# Patient Record
Sex: Female | Born: 1935 | Race: White | Hispanic: No | Smoking: Never smoker
Health system: Southern US, Community
[De-identification: ages and names within clinical notes are randomized; demographics above are authoritative.]

## PROBLEM LIST (undated history)

## (undated) DIAGNOSIS — K8689 Other specified diseases of pancreas: Secondary | ICD-10-CM

## (undated) DIAGNOSIS — H409 Unspecified glaucoma: Secondary | ICD-10-CM

## (undated) DIAGNOSIS — G5600 Carpal tunnel syndrome, unspecified upper limb: Secondary | ICD-10-CM

## (undated) DIAGNOSIS — J3089 Other allergic rhinitis: Secondary | ICD-10-CM

## (undated) DIAGNOSIS — Z8659 Personal history of other mental and behavioral disorders: Secondary | ICD-10-CM

## (undated) DIAGNOSIS — S83206A Unspecified tear of unspecified meniscus, current injury, right knee, initial encounter: Secondary | ICD-10-CM

## (undated) DIAGNOSIS — K635 Polyp of colon: Secondary | ICD-10-CM

## (undated) DIAGNOSIS — I679 Cerebrovascular disease, unspecified: Secondary | ICD-10-CM

## (undated) DIAGNOSIS — M47812 Spondylosis without myelopathy or radiculopathy, cervical region: Secondary | ICD-10-CM

## (undated) DIAGNOSIS — I700 Atherosclerosis of aorta: Secondary | ICD-10-CM

## (undated) DIAGNOSIS — M47816 Spondylosis without myelopathy or radiculopathy, lumbar region: Secondary | ICD-10-CM

## (undated) DIAGNOSIS — I639 Cerebral infarction, unspecified: Secondary | ICD-10-CM

## (undated) DIAGNOSIS — E785 Hyperlipidemia, unspecified: Secondary | ICD-10-CM

## (undated) DIAGNOSIS — M1990 Unspecified osteoarthritis, unspecified site: Secondary | ICD-10-CM

## (undated) DIAGNOSIS — K219 Gastro-esophageal reflux disease without esophagitis: Secondary | ICD-10-CM

## (undated) DIAGNOSIS — E039 Hypothyroidism, unspecified: Secondary | ICD-10-CM

## (undated) DIAGNOSIS — I480 Paroxysmal atrial fibrillation: Secondary | ICD-10-CM

## (undated) DIAGNOSIS — H3530 Unspecified macular degeneration: Secondary | ICD-10-CM

## (undated) DIAGNOSIS — I2510 Atherosclerotic heart disease of native coronary artery without angina pectoris: Secondary | ICD-10-CM

## (undated) DIAGNOSIS — K589 Irritable bowel syndrome without diarrhea: Secondary | ICD-10-CM

## (undated) DIAGNOSIS — I10 Essential (primary) hypertension: Secondary | ICD-10-CM

## (undated) DIAGNOSIS — J45909 Unspecified asthma, uncomplicated: Secondary | ICD-10-CM

## (undated) DIAGNOSIS — G459 Transient cerebral ischemic attack, unspecified: Secondary | ICD-10-CM

## (undated) DIAGNOSIS — M810 Age-related osteoporosis without current pathological fracture: Secondary | ICD-10-CM

## (undated) DIAGNOSIS — T8859XA Other complications of anesthesia, initial encounter: Secondary | ICD-10-CM

## (undated) DIAGNOSIS — I482 Chronic atrial fibrillation, unspecified: Secondary | ICD-10-CM

## (undated) DIAGNOSIS — I499 Cardiac arrhythmia, unspecified: Secondary | ICD-10-CM

## (undated) DIAGNOSIS — E063 Autoimmune thyroiditis: Secondary | ICD-10-CM

## (undated) DIAGNOSIS — K449 Diaphragmatic hernia without obstruction or gangrene: Secondary | ICD-10-CM

## (undated) HISTORY — PX: TONSILLECTOMY: SUR1361

## (undated) HISTORY — PX: OTHER SURGICAL HISTORY: SHX169

## (undated) HISTORY — DX: Other allergic rhinitis: J30.89

## (undated) HISTORY — PX: CERVICAL CONE BIOPSY: SUR198

## (undated) HISTORY — PX: BACK SURGERY: SHX140

## (undated) HISTORY — DX: Carpal tunnel syndrome, unspecified upper limb: G56.00

## (undated) HISTORY — PX: ENDOSCOPIC RETROGRADE CHOLANGIOPANCREATOGRAPHY (ERCP) WITH PROPOFOL: SHX5810

## (undated) HISTORY — DX: Autoimmune thyroiditis: E06.3

## (undated) HISTORY — DX: Unspecified tear of unspecified meniscus, current injury, right knee, initial encounter: S83.206A

## (undated) HISTORY — DX: Unspecified asthma, uncomplicated: J45.909

## (undated) HISTORY — DX: Paroxysmal atrial fibrillation: I48.0

## (undated) HISTORY — PX: CERVICAL FUSION: SHX112

## (undated) HISTORY — DX: Spondylosis without myelopathy or radiculopathy, cervical region: M47.812

## (undated) HISTORY — DX: Personal history of other mental and behavioral disorders: Z86.59

## (undated) HISTORY — DX: Transient cerebral ischemic attack, unspecified: G45.9

## (undated) HISTORY — PX: CATARACT EXTRACTION: SUR2

## (undated) HISTORY — DX: Gastro-esophageal reflux disease without esophagitis: K21.9

## (undated) HISTORY — DX: Unspecified glaucoma: H40.9

## (undated) HISTORY — DX: Age-related osteoporosis without current pathological fracture: M81.0

## (undated) HISTORY — PX: COLONOSCOPY: SHX174

## (undated) HISTORY — DX: Hyperlipidemia, unspecified: E78.5

## (undated) HISTORY — DX: Irritable bowel syndrome, unspecified: K58.9

## (undated) HISTORY — DX: Spondylosis without myelopathy or radiculopathy, cervical region: M47.816

## (undated) HISTORY — DX: Unspecified osteoarthritis, unspecified site: M19.90

## (undated) HISTORY — DX: Polyp of colon: K63.5

## (undated) HISTORY — PX: KNEE ARTHROSCOPY: SUR90

## (undated) HISTORY — DX: Diaphragmatic hernia without obstruction or gangrene: K44.9

---

## 2003-04-17 ENCOUNTER — Inpatient Hospital Stay (HOSPITAL_COMMUNITY): Admission: RE | Admit: 2003-04-17 | Discharge: 2003-04-18 | Payer: Self-pay | Admitting: Neurosurgery

## 2003-05-07 ENCOUNTER — Encounter: Admission: RE | Admit: 2003-05-07 | Discharge: 2003-05-07 | Payer: Self-pay | Admitting: Neurosurgery

## 2004-02-28 ENCOUNTER — Encounter: Payer: Self-pay | Admitting: Neurosurgery

## 2004-05-03 ENCOUNTER — Ambulatory Visit: Payer: Self-pay | Admitting: Unknown Physician Specialty

## 2004-06-15 ENCOUNTER — Inpatient Hospital Stay (HOSPITAL_COMMUNITY): Admission: RE | Admit: 2004-06-15 | Discharge: 2004-06-15 | Payer: Self-pay | Admitting: Neurosurgery

## 2004-09-28 ENCOUNTER — Ambulatory Visit: Payer: Self-pay | Admitting: Unknown Physician Specialty

## 2005-04-07 ENCOUNTER — Ambulatory Visit: Payer: Self-pay | Admitting: Unknown Physician Specialty

## 2005-11-08 ENCOUNTER — Ambulatory Visit: Payer: Self-pay | Admitting: Internal Medicine

## 2005-12-23 ENCOUNTER — Ambulatory Visit: Payer: Self-pay | Admitting: Unknown Physician Specialty

## 2006-01-23 ENCOUNTER — Ambulatory Visit: Payer: Self-pay | Admitting: Unknown Physician Specialty

## 2006-02-13 ENCOUNTER — Ambulatory Visit: Payer: Self-pay | Admitting: Unknown Physician Specialty

## 2010-10-04 ENCOUNTER — Ambulatory Visit: Payer: Self-pay | Admitting: Unknown Physician Specialty

## 2011-05-10 ENCOUNTER — Ambulatory Visit: Payer: Self-pay | Admitting: Unknown Physician Specialty

## 2012-04-04 ENCOUNTER — Ambulatory Visit: Payer: Self-pay | Admitting: Ophthalmology

## 2012-08-16 ENCOUNTER — Emergency Department: Payer: Self-pay | Admitting: Emergency Medicine

## 2012-08-16 LAB — CBC
HCT: 39.6 % (ref 35.0–47.0)
HGB: 13.5 g/dL (ref 12.0–16.0)
MCH: 31.8 pg (ref 26.0–34.0)
MCHC: 34.2 g/dL (ref 32.0–36.0)
MCV: 93 fL (ref 80–100)
Platelet: 213 10*3/uL (ref 150–440)
RBC: 4.26 10*6/uL (ref 3.80–5.20)
RDW: 13.1 % (ref 11.5–14.5)
WBC: 5.4 10*3/uL (ref 3.6–11.0)

## 2012-08-16 LAB — BASIC METABOLIC PANEL
Anion Gap: 7 (ref 7–16)
BUN: 15 mg/dL (ref 7–18)
Calcium, Total: 8.4 mg/dL — ABNORMAL LOW (ref 8.5–10.1)
Chloride: 103 mmol/L (ref 98–107)
Co2: 29 mmol/L (ref 21–32)
Creatinine: 0.7 mg/dL (ref 0.60–1.30)
EGFR (African American): >60
EGFR (Non-African Amer.): >60
Glucose: 85 mg/dL (ref 65–99)
Osmolality: 278 (ref 275–301)
Potassium: 3.8 mmol/L (ref 3.5–5.1)
Sodium: 139 mmol/L (ref 136–145)

## 2012-08-16 LAB — TROPONIN I
Troponin-I: <0.02 ng/mL
Troponin-I: <0.02 ng/mL

## 2012-08-17 ENCOUNTER — Telehealth: Payer: Self-pay

## 2012-08-17 ENCOUNTER — Encounter: Payer: Self-pay | Admitting: Cardiovascular Disease

## 2012-08-17 ENCOUNTER — Ambulatory Visit (INDEPENDENT_AMBULATORY_CARE_PROVIDER_SITE_OTHER): Payer: Medicare Other | Admitting: Cardiovascular Disease

## 2012-08-17 VITALS — BP 120/62 | HR 80 | Resp 16 | Ht 63.5 in | Wt 133.0 lb

## 2012-08-17 DIAGNOSIS — R079 Chest pain, unspecified: Secondary | ICD-10-CM | POA: Insufficient documentation

## 2012-08-17 DIAGNOSIS — E785 Hyperlipidemia, unspecified: Secondary | ICD-10-CM | POA: Insufficient documentation

## 2012-08-17 NOTE — Progress Notes (Signed)
Patient ID: Jacqueline Powers, female    DOB: December 11, 1934, 78 y.o.   MRN: [id number]  HPI Comments: Jacqueline Powers is a pleasant 78 year old woman with a history of hyperlipidemia, osteoporosis, remote history of chest pain, GERD who presents after evaluation in the emergency room yesterday with symptoms of chest pain.  We will contacted by the emergency room for followup after EKG and cardiac enzyme workup in the hospital last night. She reports having several weeks of stuttering chest pain. Chest pain is on the left side of her chest, sometimes down her left arm and typically presents at rest, often while sitting, sometimes in bed. She was discharged from the hospital/ER.  She reports that otherwise she is a very active person. She is scheduled to go to [place] next week and wants to make sure that everything is okay. She denies any lightheadedness or dizziness, no edema. No PND or orthopnea. She reports having tried statins before and this caused leg weakness  EKG shows normal sinus rhythm with rate 72 beats per minute, no significant ST or T wave changes   Outpatient Encounter Prescriptions as of 08/17/2012  Medication Sig Dispense Refill  . aspirin 81 MG tablet 81 mg. Given 4 tablets today but does not take on a daily basis.      Marland Kitchen BIOTIN PO Take 10,000 Units by mouth once a week.      . Cholecalciferol (VITAMIN D3) 10000 UNITS capsule Take 10,000 Units by mouth once a week.      . cyanocobalamin 2000 MCG tablet Take 2,000 mcg by mouth daily.      . naproxen sodium (ANAPROX) 220 MG tablet Take 220 mg by mouth as needed.       No facility-administered encounter medications on file as of 08/17/2012.     Review of Systems  Constitutional: Negative.   HENT: Negative.   Eyes: Negative.   Respiratory: Negative.   Cardiovascular: Positive for chest pain.       Pain radiating down her left arm  Gastrointestinal: Negative.   Musculoskeletal: Negative.   Skin: Negative.   Neurological:  Negative.   Psychiatric/Behavioral: Negative.   All other systems reviewed and are negative.    BP 120/62  Pulse 80  Ht 5' 3.5" (1.613 m)  Wt 133 lb (60.328 kg)  BMI 23.19 kg/m2  Physical Exam  Nursing note and vitals reviewed. Constitutional: She is oriented to person, place, and time. She appears well-developed and well-nourished.  HENT:  Head: Normocephalic.  Nose: Nose normal.  Mouth/Throat: Oropharynx is clear and moist.  Eyes: Conjunctivae are normal. Pupils are equal, round, and reactive to light.  Neck: Normal range of motion. Neck supple. No JVD present.  Cardiovascular: Normal rate, regular rhythm, S1 normal, S2 normal, normal heart sounds and intact distal pulses.  Exam reveals no gallop and no friction rub.   No murmur heard. Pulmonary/Chest: Effort normal and breath sounds normal. No respiratory distress. She has no wheezes. She has no rales. She exhibits no tenderness.  Abdominal: Soft. Bowel sounds are normal. She exhibits no distension. There is no tenderness.  Musculoskeletal: Normal range of motion. She exhibits no edema and no tenderness.  Lymphadenopathy:    She has no cervical adenopathy.  Neurological: She is alert and oriented to person, place, and time. Coordination normal.  Skin: Skin is warm and dry. No rash noted. No erythema.  Psychiatric: She has a normal mood and affect. Her behavior is normal. Judgment and thought content normal.  Assessment and Plan

## 2012-08-17 NOTE — Procedures (Signed)
Exercise Treadmill Test  Treadmill ordered for recent epsiodes of chest pain.  Resting EKG shows NSR with rate of 72 bpm, no significant ST or T wave changes Resting blood pressure of 120/62. Stand bruce protocal was used.  Patient exercised for 6 minutes Peak heart rate of 155 bpm.  This was 107% of the maximum predicted heart rate (target heart rate 145). Achieved 7.0 METS No symptoms of chest pain or lightheadedness were reported at peak stress or in recovery.  Peak Blood pressure recorded was 168/78 Heart rate at 3 minutes in recovery was 120 bpm. No significant ST or T-wave changes concerning for ischemia  FINAL IMPRESSION: Normal exercise stress test. No significant EKG changes concerning for ischemia. Good exercise tolerance.

## 2012-08-17 NOTE — Assessment & Plan Note (Signed)
Chest pain is atypical in nature. Stress test was performed today in the office after her consultation. This showed no significant ischemia. Peak heart rate achieved was 150. Overall a normal treadmill study. No further workup needed at this time. No treadmill symptoms reported wall at peak exercise.

## 2012-08-17 NOTE — Telephone Encounter (Signed)
Pt called back Still experiencing some chest tightness Scheduled GXT for today at 3:15 per Dr. Mariah Milling Pt aware

## 2012-08-17 NOTE — Patient Instructions (Addendum)
Your stress test was normal Track your blood pressures, goal top number is <140, bottom number <90  Try RED YEAST RICE 2 to 4 pills a day for cholesterol  Please call with blood pressure measurements or any other questions

## 2012-08-17 NOTE — Telephone Encounter (Signed)
Pt was seen in ER for CP last night Dr. Mariah Milling asks that I call pt to schedule GXT today or next week, based on pt's symptoms I attempted to reach pt to schedule at [phone] but [facility]

## 2012-08-17 NOTE — Assessment & Plan Note (Signed)
We talked about her cholesterol with her. She does not want a statin. She will try red yeast rice. Other options would include WelChol.

## 2012-09-06 ENCOUNTER — Ambulatory Visit: Payer: Self-pay | Admitting: Cardiovascular Disease

## 2012-12-02 DIAGNOSIS — H409 Unspecified glaucoma: Secondary | ICD-10-CM | POA: Insufficient documentation

## 2012-12-02 DIAGNOSIS — J329 Chronic sinusitis, unspecified: Secondary | ICD-10-CM | POA: Insufficient documentation

## 2012-12-02 DIAGNOSIS — H43392 Other vitreous opacities, left eye: Secondary | ICD-10-CM | POA: Insufficient documentation

## 2013-07-12 ENCOUNTER — Observation Stay: Payer: Self-pay | Admitting: Internal Medicine

## 2013-07-12 LAB — COMPREHENSIVE METABOLIC PANEL
Albumin: 4.2 g/dL (ref 3.4–5.0)
Alkaline Phosphatase: 105 U/L
Anion Gap: 5 — ABNORMAL LOW (ref 7–16)
BUN: 16 mg/dL (ref 7–18)
Bilirubin,Total: 0.2 mg/dL (ref 0.2–1.0)
Calcium, Total: 9.2 mg/dL (ref 8.5–10.1)
Chloride: 102 mmol/L (ref 98–107)
Co2: 31 mmol/L (ref 21–32)
Creatinine: 0.71 mg/dL (ref 0.60–1.30)
EGFR (African American): >60
EGFR (Non-African Amer.): >60
Glucose: 129 mg/dL — ABNORMAL HIGH (ref 65–99)
Osmolality: 279 (ref 275–301)
Potassium: 3.4 mmol/L — ABNORMAL LOW (ref 3.5–5.1)
SGOT(AST): 19 U/L (ref 15–37)
SGPT (ALT): 26 U/L (ref 12–78)
Sodium: 138 mmol/L (ref 136–145)
Total Protein: 7.7 g/dL (ref 6.4–8.2)

## 2013-07-12 LAB — URINALYSIS, COMPLETE
Bacteria: NONE SEEN
Bilirubin,UR: NEGATIVE
Blood: NEGATIVE
Glucose,UR: NEGATIVE mg/dL (ref 0–75)
Ketone: NEGATIVE
Leukocyte Esterase: NEGATIVE
Nitrite: NEGATIVE
Ph: 6 (ref 4.5–8.0)
Protein: NEGATIVE
RBC,UR: NONE SEEN /HPF (ref 0–5)
Specific Gravity: 1.002 (ref 1.003–1.030)
Squamous Epithelial: <1
WBC UR: <1 /HPF (ref 0–5)

## 2013-07-12 LAB — CBC
HCT: 41.6 % (ref 35.0–47.0)
HGB: 13.9 g/dL (ref 12.0–16.0)
MCH: 31.4 pg (ref 26.0–34.0)
MCHC: 33.5 g/dL (ref 32.0–36.0)
MCV: 94 fL (ref 80–100)
Platelet: 229 10*3/uL (ref 150–440)
RBC: 4.44 10*6/uL (ref 3.80–5.20)
RDW: 12.9 % (ref 11.5–14.5)
WBC: 8.2 10*3/uL (ref 3.6–11.0)

## 2013-07-12 LAB — APTT: Activated PTT: 31.7 secs (ref 23.6–35.9)

## 2013-07-12 LAB — TROPONIN I: Troponin-I: <0.02 ng/mL

## 2013-07-12 LAB — TSH: Thyroid Stimulating Horm: 7.97 u[IU]/mL — ABNORMAL HIGH

## 2013-07-12 LAB — PROTIME-INR
INR: 1
Prothrombin Time: 12.8 secs (ref 11.5–14.7)

## 2013-07-12 LAB — T4, FREE: Free Thyroxine: 1.01 ng/dL (ref 0.76–1.46)

## 2014-04-16 ENCOUNTER — Ambulatory Visit: Payer: Self-pay | Admitting: Ophthalmology

## 2014-06-23 ENCOUNTER — Ambulatory Visit (INDEPENDENT_AMBULATORY_CARE_PROVIDER_SITE_OTHER): Payer: Medicare Other | Admitting: Cardiovascular Disease

## 2014-06-23 ENCOUNTER — Encounter: Payer: Self-pay | Admitting: Cardiovascular Disease

## 2014-06-23 VITALS — BP 130/80 | HR 75 | Ht 63.0 in | Wt 135.8 lb

## 2014-06-23 DIAGNOSIS — M542 Cervicalgia: Secondary | ICD-10-CM

## 2014-06-23 DIAGNOSIS — R000 Tachycardia, unspecified: Secondary | ICD-10-CM | POA: Insufficient documentation

## 2014-06-23 DIAGNOSIS — R079 Chest pain, unspecified: Secondary | ICD-10-CM

## 2014-06-23 DIAGNOSIS — R6884 Jaw pain: Secondary | ICD-10-CM

## 2014-06-23 DIAGNOSIS — G459 Transient cerebral ischemic attack, unspecified: Secondary | ICD-10-CM | POA: Insufficient documentation

## 2014-06-23 DIAGNOSIS — E063 Autoimmune thyroiditis: Secondary | ICD-10-CM | POA: Insufficient documentation

## 2014-06-23 DIAGNOSIS — G458 Other transient cerebral ischemic attacks and related syndromes: Secondary | ICD-10-CM

## 2014-06-23 DIAGNOSIS — E785 Hyperlipidemia, unspecified: Secondary | ICD-10-CM

## 2014-06-23 NOTE — Assessment & Plan Note (Signed)
Cholesterol is at goal on the current lipid regimen. No changes to the medications were made.  

## 2014-06-23 NOTE — Progress Notes (Signed)
Patient ID: Jacqueline Powers, female    DOB: 08/25/1934, 80 y.o.   MRN: [id number]  HPI Comments: Jacqueline Powers is a pleasant 80 year old woman with a history of hyperlipidemia, Hashimoto's thyroiditis with elevated TSH, osteoporosis, remote history of chest pain, GERD, previously evaluated for chest pain. She presents today for recent symptoms of jaw pain, neck pain, tachycardia. She does have a prior history of neck  surgery, fusion  She reports having a TIA February 2015. She had workup at [facility]. Results were discussed with her in detail.  She had left upper extremity, left lower extremity weakness that resolved in 15 minutes CT scan and MRI did not document stroke. No cardiology consult or echocardiogram. She was not scheduled for Holter monitor. Carotid ultrasound showed mild disease on one side, minimal on the other She was started on aspirin and discharged home. No further episodes of TIA or stroke over the past year.  On his 22nd she developed tachycardia, jaw pain, neck pain lasting 45 minutes up to one hour. Repeat episode 06/05/2014 with heart rate up to 140 bpm. Again some jaw pain. She reports having an episode last week where she had jaw pain, heart rate up to him with him 100. She reports her blood pressure has been labile, up and down, sometimes up to 150/160 systolic. She denies having reproducible chest pain or jaw pain with exertion. Significant anxiety concerning her symptoms. Currently taking a statin, no side effects  EKG on today's visit shows normal sinus rhythm with rate 75 bpm, no significant ST or T-wave changes EKG in the hospital febrile 2015 showing normal sinus rhythm   Allergies  Allergen Reactions  . Codeine   . Septra [Sulfamethoxazole-Trimethoprim]   . Simvastatin   . Sulfa Antibiotics     Outpatient Encounter Prescriptions as of 06/23/2014  Medication Sig  . albuterol (PROVENTIL HFA;VENTOLIN HFA) 108 (90 BASE) MCG/ACT inhaler Inhale into the lungs.  Marland Kitchen  aspirin 81 MG tablet Take 81 mg by mouth daily. Given 4 tablets today but does not take on a daily basis.  Marland Kitchen atorvastatin (LIPITOR) 10 MG tablet Take 10 mg by mouth daily at 6 PM. take 1 tablet by mouth once daily  . fluticasone (FLONASE) 50 MCG/ACT nasal spray instill 2 sprays into each nostril at bedtime if needed  . levothyroxine (SYNTHROID, LEVOTHROID) 25 MCG tablet Take 25 mcg by mouth daily before breakfast.   . naproxen sodium (ANAPROX) 220 MG tablet Take 220 mg by mouth as needed.  . [DISCONTINUED] cyanocobalamin 2000 MCG tablet Take 2,000 mcg by mouth daily.  . [DISCONTINUED] BIOTIN PO Take 10,000 Units by mouth once a week.  . [DISCONTINUED] Cholecalciferol (VITAMIN D3) 10000 UNITS capsule Take 10,000 Units by mouth once a week.    Past Medical History  Diagnosis Date  . Hyperlipidemia   . Asthma   . Arthritis   . GERD (gastroesophageal reflux disease)   . Hiatal hernia   . History of depression   . Right knee meniscal tear   . Glaucoma   . Osteoporosis   . Irritable bowel syndrome   . Perennial allergic rhinitis   . Colonic polyp   . Thyroid nodule   . Carpal tunnel syndrome   . Degenerative joint disease of cervical and lumbar spine   . TIA (transient ischemic attack)     Past Surgical History  Procedure Laterality Date  . Cervical fusion    . Back surgery      L5  . Colonoscopy    .  Cervical cone biopsy    . Cataract extraction      Social History  reports that she has quit smoking. Her smoking use included Cigarettes. She does not have any smokeless tobacco history on file. She reports that she drinks alcohol. She reports that she does not use illicit drugs.  Family History family history includes Hypertension in her sister.   Review of Systems  Constitutional: Negative.   HENT: Negative.        Jaw pain  Eyes: Negative.   Respiratory: Negative.   Cardiovascular: Positive for palpitations.       Tachycardia  Gastrointestinal: Negative.    Musculoskeletal: Positive for neck pain.  Skin: Negative.   Neurological: Negative.   Psychiatric/Behavioral: Negative.   All other systems reviewed and are negative.   Ht 5\' 3"  (1.6 m)  Wt 135 lb 12 oz (61.576 kg)  BMI 24.05 kg/m2  Physical Exam  Constitutional: She is oriented to person, place, and time. She appears well-developed and well-nourished.  HENT:  Head: Normocephalic.  Nose: Nose normal.  Mouth/Throat: Oropharynx is clear and moist.  Eyes: Conjunctivae are normal. Pupils are equal, round, and reactive to light.  Neck: Normal range of motion. Neck supple. No JVD present.  Cardiovascular: Normal rate, regular rhythm, S1 normal, S2 normal, normal heart sounds and intact distal pulses.  Exam reveals no gallop and no friction rub.   No murmur heard. Pulmonary/Chest: Effort normal and breath sounds normal. No respiratory distress. She has no wheezes. She has no rales. She exhibits no tenderness.  Abdominal: Soft. Bowel sounds are normal. She exhibits no distension. There is no tenderness.  Musculoskeletal: Normal range of motion. She exhibits no edema or tenderness.  Lymphadenopathy:    She has no cervical adenopathy.  Neurological: She is alert and oriented to person, place, and time. Coordination normal.  Skin: Skin is warm and dry. No rash noted. No erythema.  Psychiatric: She has a normal mood and affect. Her behavior is normal. Judgment and thought content normal.    Assessment and Plan  Nursing note and vitals reviewed.

## 2014-06-23 NOTE — Assessment & Plan Note (Signed)
Etiology of her TIAs unclear. Left-sided weakness resolved relatively quickly. If she has additional episodes, may need to start Plavix with aspirin, rule out PFO

## 2014-06-23 NOTE — Patient Instructions (Addendum)
You are doing well. No medication changes were made.  We will order a 30 day monitor for arrhythmia, tachycardia, TIA Keep a diary of jaw pain episodes  Please call us if you have new issues that need to be addressed before your next appt.  Your physician wants you to follow-up in: 6 weeks

## 2014-06-23 NOTE — Assessment & Plan Note (Signed)
Etiology of her jaw pain is unclear, seems to coincide with her tachycardia. We have ordered a 30 day monitor to rule out atrial fibrillation. Arrhythmias concerning given prior TIA symptoms

## 2014-06-23 NOTE — Assessment & Plan Note (Signed)
She reports heart rates up to the 140 range, 120 and then over 100 associated with jaw pain. Given her history of TIA, event monitor has been ordered to rule out arrhythmia, atrial fibrillation

## 2014-06-23 NOTE — Assessment & Plan Note (Signed)
Unclear if her periodic neck pain is musculoskeletal given her prior neck surgery. If symptoms get worse, recommended she follow-up with her previous neurosurgeon

## 2014-06-23 NOTE — Assessment & Plan Note (Signed)
Currently on low-dose thyroid supplement

## 2014-06-24 DIAGNOSIS — R000 Tachycardia, unspecified: Secondary | ICD-10-CM | POA: Diagnosis not present

## 2014-08-01 ENCOUNTER — Telehealth: Payer: Self-pay | Admitting: Cardiovascular Disease

## 2014-08-01 NOTE — Telephone Encounter (Signed)
She was advised at last ov to return in 6 weeks. Does she need to be seen or can I go over her results over the phone?

## 2014-08-01 NOTE — Telephone Encounter (Signed)
Patient wants to know if she has to actually come for this appointment or can she just get results over the phone.  Patient says Dr. Mariah MillingGollan gave her this option at last visit.  She would rather go over results on phone than come in for appointment.  Please call patient.

## 2014-08-04 ENCOUNTER — Encounter: Payer: Self-pay | Admitting: Cardiovascular Disease

## 2014-08-04 ENCOUNTER — Ambulatory Visit (INDEPENDENT_AMBULATORY_CARE_PROVIDER_SITE_OTHER): Payer: Medicare Other | Admitting: Cardiovascular Disease

## 2014-08-04 ENCOUNTER — Encounter (INDEPENDENT_AMBULATORY_CARE_PROVIDER_SITE_OTHER): Payer: Self-pay

## 2014-08-04 VITALS — BP 124/74 | HR 74 | Ht 63.0 in | Wt 135.0 lb

## 2014-08-04 DIAGNOSIS — R6884 Jaw pain: Secondary | ICD-10-CM

## 2014-08-04 DIAGNOSIS — E063 Autoimmune thyroiditis: Secondary | ICD-10-CM

## 2014-08-04 DIAGNOSIS — I4891 Unspecified atrial fibrillation: Secondary | ICD-10-CM | POA: Insufficient documentation

## 2014-08-04 DIAGNOSIS — I480 Paroxysmal atrial fibrillation: Secondary | ICD-10-CM

## 2014-08-04 DIAGNOSIS — G458 Other transient cerebral ischemic attacks and related syndromes: Secondary | ICD-10-CM

## 2014-08-04 MED ORDER — RIVAROXABAN 20 MG PO TABS: 20.0000 mg | ORAL_TABLET | Freq: Every day | ORAL | Status: DC

## 2014-08-04 MED ORDER — METOPROLOL SUCCINATE ER 25 MG PO TB24: 25.0000 mg | ORAL_TABLET | Freq: Every day | ORAL | Status: DC

## 2014-08-04 NOTE — Assessment & Plan Note (Signed)
She is currently on low-dose thyroid supplementation medication TSH in the hospital 7.97, free thyroxine 1.01

## 2014-08-04 NOTE — Patient Instructions (Signed)
You are doing well.  You are having paroxysmal atrial fibrillation Please hold the aspirin Please start xarelto one a day (blood thinner) Also start metoprolol one a day  Please call us if you have new issues that need to be addressed before your next appt.  Your physician wants you to follow-up in: 3 months.  You will receive a reminder letter in the mail two months in advance. If you don't receive a letter, please call our office to schedule the follow-up appointment.

## 2014-08-04 NOTE — Assessment & Plan Note (Signed)
Jaw pain possibly from underlying atrial fibrillation and tachycardia. We have recommended she call us if she has additional episodes of jaw pain. May need to consider ischemia workup

## 2014-08-04 NOTE — Assessment & Plan Note (Signed)
Atrial fibrillation documented on 30 day monitor. Possibly associated with her jaw pain. Heart rate up to 160 bpm on 07/09/2014. We will start her on metoprolol succinate 25 mg daily him a also start xarelto 20 mg daily. Various treatment options were discussed with her. We will try to maintain normal sinus rhythm, anticoagulation for stroke prevention. She has had stroke/TIA-type symptoms in the past

## 2014-08-04 NOTE — Assessment & Plan Note (Signed)
No recent TIA-type symptoms

## 2014-08-04 NOTE — Progress Notes (Signed)
Patient ID: Jacqueline Powers, female    DOB: 06/10/1934, 80 y.o.   MRN: [id number]  HPI Comments: Jacqueline Powers is a pleasant 80 year old woman with a history of hyperlipidemia, Hashimoto's thyroiditis with elevated TSH, osteoporosis, remote history of chest pain, GERD, previously evaluated for chest pain. Previously seen for symptoms of jaw pain, neck pain, tachycardia. She does have a prior history of neck  surgery, fusion On her last clinic visit we ordered a 30 day monitor. She presents today for follow-up of her monitor results.  30 day monitor shows atrial fibrillation on February 10 lasting for several hours, 9:30 at night until at least 1 AM on 07/10/2014 She has report occasional episodes of jaw pain. Uncertain if this correlates with her arrhythmia. She also reports having episode of tachycardia last night. This resolved after several hours.  Other past medical history  TIA February 2015.  She had left upper extremity, left lower extremity weakness that resolved in 15 minutes CT scan and MRI did not document stroke. Carotid ultrasound showed mild disease on one side, minimal on the other She was started on aspirin and discharged home. No further episodes of TIA or stroke over the past year.  Previous episodes of tachycardia, jaw pain, neck pain lasting 45 minutes up to one hour. Repeat episode 06/05/2014 with heart rate up to 140 bpm. Again some jaw pain. She reports having an episode last week where she had jaw pain, heart rate up to him with him 100.     Allergies  Allergen Reactions  . Codeine   . Septra [Sulfamethoxazole-Trimethoprim]   . Simvastatin   . Sulfa Antibiotics     Outpatient Encounter Prescriptions as of 08/04/2014  Medication Sig  . albuterol (PROVENTIL HFA;VENTOLIN HFA) 108 (90 BASE) MCG/ACT inhaler Inhale into the lungs.  Marland Kitchen. atorvastatin (LIPITOR) 10 MG tablet Take 10 mg by mouth daily at 6 PM. take 1 tablet by mouth once daily  . fluticasone (FLONASE) 50  MCG/ACT nasal spray instill 2 sprays into each nostril at bedtime if needed  . levothyroxine (SYNTHROID, LEVOTHROID) 25 MCG tablet Take 25 mcg by mouth daily before breakfast.   . naproxen sodium (ANAPROX) 220 MG tablet Take 220 mg by mouth as needed.  . [DISCONTINUED] aspirin 81 MG tablet Take 81 mg by mouth daily. Given 4 tablets today but does not take on a daily basis.  Marland Kitchen. metoprolol succinate (TOPROL-XL) 25 MG 24 hr tablet Take 1 tablet (25 mg total) by mouth daily.  . rivaroxaban (XARELTO) 20 MG TABS tablet Take 1 tablet (20 mg total) by mouth daily with supper.    Past Medical History  Diagnosis Date  . Hyperlipidemia   . Asthma   . Arthritis   . GERD (gastroesophageal reflux disease)   . Hiatal hernia   . History of depression   . Right knee meniscal tear   . Glaucoma   . Osteoporosis   . Irritable bowel syndrome   . Perennial allergic rhinitis   . Colonic polyp   . Thyroid nodule   . Carpal tunnel syndrome   . Degenerative joint disease of cervical and lumbar spine   . TIA (transient ischemic attack)     Past Surgical History  Procedure Laterality Date  . Cervical fusion    . Back surgery      L5  . Colonoscopy    . Cervical cone biopsy    . Cataract extraction      Social History  reports that she  has quit smoking. Her smoking use included Cigarettes. She does not have any smokeless tobacco history on file. She reports that she drinks alcohol. She reports that she does not use illicit drugs.  Family History family history includes Hypertension in her sister.   Review of Systems  Constitutional: Negative.   HENT: Negative.        Jaw pain  Eyes: Negative.   Respiratory: Negative.   Cardiovascular: Positive for palpitations.       Tachycardia  Gastrointestinal: Negative.   Musculoskeletal: Positive for neck pain.  Skin: Negative.   Neurological: Negative.   Psychiatric/Behavioral: Negative.   All other systems reviewed and are negative.   BP 124/74  mmHg  Pulse 74  Ht  (1.6 m)  Wt 135 lb (61.236 kg)  BMI 23.92 kg/m2  Physical Exam  Constitutional: She is oriented to person, place, and time. She appears well-developed and well-nourished.  HENT:  Head: Normocephalic.  Nose: Nose normal.  Mouth/Throat: Oropharynx is clear and moist.  Eyes: Conjunctivae are normal. Pupils are equal, round, and reactive to light.  Neck: Normal range of motion. Neck supple. No JVD present.  Cardiovascular: Normal rate, regular rhythm, S1 normal, S2 normal, normal heart sounds and intact distal pulses.  Exam reveals no gallop and no friction rub.   No murmur heard. Pulmonary/Chest: Effort normal and breath sounds normal. No respiratory distress. She has no wheezes. She has no rales. She exhibits no tenderness.  Abdominal: Soft. Bowel sounds are normal. She exhibits no distension. There is no tenderness.  Musculoskeletal: Normal range of motion. She exhibits no edema or tenderness.  Lymphadenopathy:    She has no cervical adenopathy.  Neurological: She is alert and oriented to person, place, and time. Coordination normal.  Skin: Skin is warm and dry. No rash noted. No erythema.  Psychiatric: She has a normal mood and affect. Her behavior is normal. Judgment and thought content normal.    Assessment and Plan  Nursing note and vitals reviewed.

## 2014-08-26 ENCOUNTER — Ambulatory Visit (INDEPENDENT_AMBULATORY_CARE_PROVIDER_SITE_OTHER): Payer: Medicare Other

## 2014-08-26 ENCOUNTER — Other Ambulatory Visit: Payer: Self-pay

## 2014-08-26 DIAGNOSIS — R000 Tachycardia, unspecified: Secondary | ICD-10-CM

## 2014-08-26 DIAGNOSIS — I480 Paroxysmal atrial fibrillation: Secondary | ICD-10-CM

## 2014-09-20 NOTE — Discharge Summary (Signed)
PATIENT NAME:  Jacqueline Powers, Jacqueline Powers MR#:  [id number] DATE OF BIRTH:  1934-12-29  DATE OF ADMISSION:  07/12/2013 DATE OF DISCHARGE:  07/12/2013  DISCHARGE DIAGNOSIS:  1.  Possible transient ischemic attack with acute onset of left-sided weakness, clumsiness and slurred speech, which was resolved in 15 minutes and then had all negative neurological work-up. This certainly could be cervical radiculopathy also considering her history of cervical fusion.  2.  Hypothyroidism with history of Hashimoto's thyroiditis with high TSH, recommended outpatient endocrinology followup and started on levothyroxine.   SECONDARY DIAGNOSES: 1.  Hyperlipidemia.  2.  Gastroesophageal reflux disease.   CONSULTATIONS: Endocrine, Dr. Carlena SaxAnna Solum.   PROCEDURES AND RADIOLOGY: Chest x-ray on 13th of February showed no acute cardiopulmonary disease.   CT scan of the head without contrast on 13th of February showed no acute intracranial process.   MRI of the brain without contrast on 13th of February showed no acute pathology.   Bilateral carotid Dopplers on 13th of February showed no hemodynamically significant carotid stenosis. She had a proximal right ICA stenosis of less than 50%, and same on the left also.   MAJOR LABORATORY PANEL: Urinalysis on admission was negative.   HISTORY AND SHORT HOSPITAL COURSE: The patient is a [age] year old female with above-mentioned medical problems who was admitted for acute left-sided clumsiness, weakness and slurred speech, mainly upper extremity weakness. Her speech and all other symptoms were resolved in 15 minutes. Please see Dr. Deatra Inaavid Hower's dictated history and physical for further details. She had a complete neurological work-up, which was all essentially within normal limits and negative. She was found to have elevated TSH, which was thought to be due to hypothyroidism, for which endocrine consultation was obtained with Dr. Carlena SaxAnna Solum, who recommended starting her on low-dose  levothyroxine which was started. The patient was feeling much better and was discharged home on 13th of February in stable condition.   On the date of discharge, her vital signs were as follows: Temperature 98.1, heart rate 65 per minute, respirations 18 per minute, blood pressure 132/76 mmHg. She was saturating 94% on room air.   PERTINENT PHYSICAL EXAMINATION ON THE DATE OF DISCHARGE:  CARDIOVASCULAR: S1, S2 normal. No murmurs, rubs, or gallop.  LUNGS: Clear to auscultation bilaterally. No wheezing, rales, rhonchi, or crepitation.  ABDOMEN: Soft, benign.  NEUROLOGIC: Nonfocal examination.   All other physical examination remained at baseline.   DISCHARGE MEDICATIONS: 1.  Omeprazole 20 mg p.o. daily as needed.  2.  Vitamin D3 once daily. 3.  Vitamin B12 once daily.  4.  Aspirin 81 mg p.o. daily.  5.  Lipitor 20 mg p.o. daily. 6.  Levothyroxine 25 mcg p.o. daily.   DISCHARGE DIET: Low-sodium, low-fat, low-cholesterol.   DISCHARGE ACTIVITY: As tolerated.   DISCHARGE INSTRUCTIONS AND FOLLOWUP: The patient was instructed to follow up with her primary care physician, Dr. Larwance SachsBabaoff, in 1 to 2 weeks. [facility] 4 [facility] 6 weeks, [facility] 2 to 4 weeks, and with Dr. Tedd SiasSolum in 2 to 3 weeks.   TOTAL TIME DISCHARGING THIS PATIENT: 55 minutes.   ____________________________ Ellamae SiaVipul S. Sherryll BurgerShah, MD vss:jcm D: 07/13/2013 12:25:05 ET T: 07/13/2013 20:09:14 ET JOB#: [id number]  cc: Rameses Ou S. Sherryll BurgerShah, MD, <Dictator> Kandyce RudMarcus Babaoff, MD A. Wendall MolaMelissa Solum, MD [facility] Ellamae SiaVIPUL S [facility] MD ELECTRONICALLY SIGNED 07/14/2013 17:16

## 2014-09-20 NOTE — Consult Note (Signed)
PATIENT NAME:  Jacqueline Powers, RANA MR#:  [id number] DATE OF BIRTH:  1934/06/05  DATE OF CONSULTATION:  07/12/2013  REFERRING PHYSICIAN:  Delfino Lovett, MD. CONSULTING PHYSICIAN:  A. Wendall Mola, MD  CHIEF COMPLAINT:  Hashimoto's thyroiditis.   HISTORY OF PRESENT ILLNESS:  This is a 80 year old female seen in consultation for concerns about her thyroid function. She was admitted earlier today with left-sided weakness concerning for a TIA and has undergone a TIA workup. She also had some complaints that she was concerned were related to her thyroid function. She reports a diagnosis of Hashimoto's thyroiditis 10 years ago. She has never taken any medications for thyroid dysfunction. She states periodic thyroid function tests performed in the past has been normal, however labs earlier today showed her TSH is elevated at 7.97. In reviewing labs in the [facility] system, I see her TSH was normal in October at 2.779 and was also normal in December 2013 at 2.478. She complains of hair loss and specifically brittleness of the hair. She has noticed hands going cold. Energy is fairly good. She has constipation.   PAST MEDICAL HISTORY:  1.  Hyperlipidemia.  2.  GERD.  3.  Hashimoto's thyroiditis.   ALLERGIES:  CODEINE AND SEPTRA.   CURRENT INPATIENT MEDICATIONS:  1.  Atorvastatin 20 mg at bedtime.  2.  Pantoprazole 40 mg daily.  3.  Aspirin 325 mg daily.   FAMILY HISTORY:  No known thyroid disease.   SOCIAL HISTORY:  Denies tobacco use. Occasional alcohol use.   REVIEW OF SYSTEMS:  GENERAL:  Denies weight loss. Denies fever.  HEENT:  Denies blurred vision. Denies sore throat.  NECK:  Reports posterior neck pain related to prior cervical fusion. No anterior neck pain. No dysphasia.  CARDIAC:  No chest pain or palpitation.  PULMONARY:  No cough. No shortness of breath.  ABDOMEN:  Good appetite. No abdominal pain.  EXTREMITIES:  Denies leg swelling. Denies focal weakness.  ENDOCRINOLOGY:  Denies  heat or cold intolerance, although again does report cold sense of the fingertips only.  GENITOURINARY:  Denies dysuria or hematuria.  HEMATOLOGIC:  Denies easy bruisability or recent bleeding.   PHYSICAL EXAMINATION:  VITAL SIGNS:  Height 62.9 inches, weight 130 pounds, BMI 23.1, temperature 98.1, pulse 76, respirations 18, blood pressure 128/75, pulse ox 94% on room air.  GENERAL:  Well-developed white female in no acute distress.  HEENT:  No proptosis, lid lag or stare.  OROPHARYNX:  Clear.  NECK:  Supple. No appreciable thyromegaly or palpable thyroid nodules.  SKIN:  Hair does appear dry. No rash is present. No dermatopathy is present.  CARDIAC:  Regular rate and rhythm. No murmur.  PULMONARY:  Clear to auscultation bilaterally. No wheeze.  ABDOMEN:  Diffusely soft, nontender, nondistended.  EXTREMITIES:  No peripheral edema is present.  NEUROLOGIC:  Full range of motion in all extremities. No dysarthria. No focal deficits. No facial droop.  PSYCHIATRIC:  Calm and cooperative.   LABORATORY DATA:  Glucose 129, BUN 16, creatinine 0.71, sodium 138, potassium 3.4, CO2 31, calcium 9.2, albumin 4.2. Troponin I less than 0.02. AST 19, ALT 26, TSH 7.97. Hematocrit 41.6, WBC 8.2. INR 1.0. Urinalysis essentially negative.   ASSESSMENT:  A 80 year old female admitted for concerns of transient ischemic attack also with a history of Hashimoto's thyroiditis and mildly elevated TSH level. In the setting of some symptoms, she does have evidence of clinical hypothyroidism.   RECOMMENDATION: 1.  Start levothyroxine 25 mcg daily. She was advised on  how this medicaion is to be taken each morning fasting and to then wait 30 min before eating or drinking anything other than water. 2.  Will plan for outpatient followup in 4 to 6 weeks. I will have this scheduled.  Thank you for the kind request for consultation. Please call if there are any questions.    ____________________________ A. Wendall MolaMelissa Rion Schnitzer,  MD ams:jm D: 07/12/2013 16:11:01 ET T: 07/12/2013 16:34:22 ET JOB#: [id number]  cc: A. Wendall MolaMelissa Cheresa Siers, MD, <Dictator> Macy MisA. MELISSA Tedford Berg MD ELECTRONICALLY SIGNED 07/17/2013 21:11

## 2014-09-20 NOTE — H&P (Signed)
PATIENT NAME:  Jacqueline Powers, Jacqueline Powers MR#:  [id number] DATE OF BIRTH:  1934/08/04  DATE OF ADMISSION:  07/12/2013  REFERRING PHYSICIAN:  Dr. Fanny Bien.  PRIMARY CARE PHYSICIAN:     CHIEF COMPLAINT:  Left-sided weakness.   HISTORY OF PRESENT ILLNESS:  This is a 80 year old Caucasian female with past medical history of hyperlipidemia, presenting with acute onset of left-sided weakness.  She describes sudden onset left-sided weakness mainly with left hand clumsiness, upper extremity weakness, inability to move upper extremity, gait disturbance with associated slurred speech, unknown if has facial droop.  The majority of symptoms lasted in total 15 minutes.  During symptoms she called her daughter who advised her to take aspirin which she did, after initial improvement and  then once again worsening of symptoms, however a total of 15 minutes.  On arrival to the Emergency Department everything has improved except for some paresthesias with numbness, weakness completely resolved.  No complaints at this time.   REVIEW OF SYSTEMS:  CONSTITUTIONAL:  Denies fever, fatigue.  Positive for weakness as described above.  EYES:  Denies blurred vision, double vision, eye pain.  EARS, NOSE, THROAT:  Denies tinnitus, ear pain, hearing loss.  RESPIRATORY:  Denies cough, wheeze, shortness of breath.  CARDIOVASCULAR:  Denies chest pain, palpitations, edema.  GASTROINTESTINAL:  Denies nausea, vomiting, diarrhea.  GENITOURINARY:  Denies dysuria, hematuria.  ENDOCRINE:  Denies nocturia or thyroid problems.  HEMATOLOGIC AND LYMPHATIC:  Denies easy bruising, bleeding.  SKIN:  Denies rash or lesions.  MUSCULOSKELETAL:  Denies pain in neck, back, shoulder, knees, hips, arthritic symptoms.  NEUROLOGIC:  Positive for left upper extremity and face numbness, weakness has improved.  Denies any headache. PSYCHIATRIC:  Denies anxiety or depressive symptoms.  Otherwise, full review of systems performed by me is negative.   PAST MEDICAL  HISTORY:  Hyperlipidemia, gastroesophageal reflux disease.   SOCIAL HISTORY:  Denies tobacco use.  Positive for occasional alcohol usage.  Denies drug usage.   FAMILY HISTORY:  Positive for TIAs in both parents.   ALLERGIES:  CODEINE AND SEPTRA.   HOME MEDICATIONS:  Include atorvastatin of unknown dosage, Prilosec 20 mg by mouth daily, vitamin B12 1 tablet by mouth daily, vitamin D3 10,000 international units by mouth daily.   PHYSICAL EXAMINATION:  VITAL SIGNS:  Temperature 97, heart rate 93, respirations 18, blood pressure 178/86, saturating 97% on room air.  Weight 59 kg, BMI 23.0.  GENERAL:  Well-nourished, well-developed, Caucasian female, currently in no acute distress.  HEAD:  Normocephalic, atraumatic.  EYES:  Pupils equal, round, reactive to light.  Extraocular muscles intact.  No scleral icterus.  MOUTH:  Moist mucosal membrane.  Dentition intact.  No abscess noted.  EARS, NOSE, THROAT:  Throat clear without exudates.  No external lesions.  NECK:  Supple.  No thyromegaly.  No nodules.  No JVD.  PULMONARY:  Clear to auscultation bilaterally without wheezes, rubs or rhonchi.  No use of accessory muscles.  Good respiratory effort.  CHEST:  Nontender to palpation.  CARDIOVASCULAR:  S1, S2, regular rate and rhythm.  No murmurs, rubs, or gallops.  No edema.  Pedal pulses 2+ bilaterally.  GASTROINTESTINAL:  Soft, nontender, nondistended.  No masses.  Positive bowel sounds.  No hepatosplenomegaly.  MUSCULOSKELETAL:  No swelling, clubbing, edema.  Range of motion full in all extremities. NEUROLOGIC:  Cranial nerves II through XII intact.  No gross focal neurological deficits.  Reflexes intact.  Sensation, she has subjective diminished light touch to the mandibular branch of the trigeminal  nerve.  Otherwise, remainder of sensation intact.  Strength 5 out of 5 in all extremities including flexor and extensor in both proximal and distal muscle groups.  Pronator drift within normal limits.  Gait  deferred at this time.  SKIN:  No ulcerations, lesions, rashes, cyanosis.  Skin warm, dry.  Turgor intact.  PSYCHIATRIC:  Mood and affect within normal limits.  The patient is awake, alert and oriented x 3.  Insight and judgment intact.   LABORATORY DATA:  EKG performed revealing normal sinus rhythm.  Chest x-ray performed revealing no acute cardiopulmonary process.  CT head performed revealing no acute intracranial process.  Remainder of laboratory data, sodium 138, potassium 3.4, chloride 102, bicarb 31, BUN 16, creatinine 0.71, glucose 129.  LFTs within normal limits.  WBC 8.2, hemoglobin 13.9, platelets of 229.  Urinalysis negative for evidence of infection.   ASSESSMENT AND PLAN:  A 80 year old Caucasian female with history of hyperlipidemia, presenting with acute onset of left-sided weakness.  1.  Transient ischemic attack with neuro checks q. 4 hours.  She has received aspirin.  We will add statin therapy.  Check MRI, lipids, carotid Dopplers, transthoracic echocardiogram for risk factor modification.  Permissive hypertension, treating blood pressure only if systolic greater than 220 or diastolic greater than 120 or the patient becomes symptomatic at that time, we will give hydralazine 10 mg IV as needed for hypertension. 2.  Hyperlipidemia.  Continue with his home dose of statin therapy.   3.  Gastroesophageal reflux disease.  Continue with PPI.  4.  Hypokalemia.  Replace potassium to goal of 4 to 5.  5.  Venous thromboembolism prophylaxis with sequential compression devices.  6.  CODE STATUS:  THE PATIENT IS FULL CODE.   TIME SPENT:  45 minutes.    ____________________________ Cletis Athensavid K. Hower, MD dkh:ea D: 07/12/2013 01:36:10 ET T: 07/12/2013 03:28:52 ET JOB#: [id number]  cc: Cletis Athensavid K. Hower, MD, <Dictator> DAVID Synetta ShadowK HOWER MD ELECTRONICALLY SIGNED 07/12/2013 21:05

## 2014-09-20 NOTE — Consult Note (Signed)
Allergies:  Codeine: N/V/Diarrhea  Septra: N/V/Diarrhea  Assessment/Plan:  Assessment/Plan Patient seen in consultation for concerns about thyriod function. has h/o Hashimoto's thyroiditis. Main complaint is dry hair.  pt seen, examined, and chart reviewed.  Exam nml. No goiter.  TSH elev at 7.97  A/ Hypothyroidism  P/ Add LT4 25 mcg daily Will f/u as out-pt in about 6 weeks.  Full consult to be dictated.   Electronic Signatures: Raj JanusSolum, Anna M (MD)  (Signed 13-Feb-15 16:12)  Authored: ALLERGIES, Assessment/Plan   Last Updated: 13-Feb-15 16:12 by Raj JanusSolum, Anna M (MD)

## 2014-10-21 ENCOUNTER — Telehealth: Payer: Self-pay

## 2014-10-21 NOTE — Telephone Encounter (Signed)
Pt called, states she hit her finger a week ago, and now she has a "knot" on her finger, she thinks this may be a blood clot, states she is on Xarelto. Please call.

## 2014-10-21 NOTE — Telephone Encounter (Signed)
Attempted to contact pt, but "due to network difficulties, call cannot be made at this time". 

## 2014-10-21 NOTE — Telephone Encounter (Signed)
Left message for pt to call back  °

## 2014-11-10 ENCOUNTER — Ambulatory Visit (INDEPENDENT_AMBULATORY_CARE_PROVIDER_SITE_OTHER): Payer: Medicare Other | Admitting: Cardiovascular Disease

## 2014-11-10 ENCOUNTER — Encounter: Payer: Self-pay | Admitting: Cardiovascular Disease

## 2014-11-10 VITALS — BP 122/82 | HR 65 | Ht 63.0 in | Wt 136.5 lb

## 2014-11-10 DIAGNOSIS — E785 Hyperlipidemia, unspecified: Secondary | ICD-10-CM

## 2014-11-10 DIAGNOSIS — Z7189 Other specified counseling: Secondary | ICD-10-CM

## 2014-11-10 DIAGNOSIS — I480 Paroxysmal atrial fibrillation: Secondary | ICD-10-CM

## 2014-11-10 NOTE — Assessment & Plan Note (Signed)
Rare episodes, we'll continue current medications. Recommended she take her metoprolol at nighttime as she does have some fatigue Take extra half dose or full dose metoprolol for breakthrough arrhythmia

## 2014-11-10 NOTE — Assessment & Plan Note (Signed)
She is tolerating anticoagulation, occasional bruising. History of TIA

## 2014-11-10 NOTE — Assessment & Plan Note (Signed)
No recent lipid panel available. Encouraged her to stay on her Lipitor

## 2014-11-10 NOTE — Progress Notes (Signed)
Patient ID: Jacqueline Powers, female    DOB: Nov 12, 1934, 80 y.o.   MRN: [id number]  HPI Comments: [name] is a pleasant 80 year old woman with a history of hyperlipidemia, Hashimoto's thyroiditis with elevated TSH, osteoporosis, remote history of chest pain, GERD, previously evaluated for chest pain. Previously seen for symptoms of jaw pain, neck pain, tachycardia. She does have a prior history of neck  surgery, fusion On her last clinic visit we ordered a 30 day monitor. She presents today for follow-up of her atrial fibrillation  In follow-up, she reports having very rare episodes of palpitations. She had 2 episodes lasting 15 minutes or less since her last office visit. Denies having any jaw discomfort When she monitor her pulse rate, she did not notice any significant change during these episodes. She wonders if this could have been from nerves  EKG on today's visit shows normal sinus rhythm with rate 65 bpm, no significant ST or T-wave changes  Other past medical history  30 day monitor shows atrial fibrillation on February 10 lasting for several hours, 9:30 at night until at least 1 AM on 07/10/2014   TIA February 2015.  She had left upper extremity, left lower extremity weakness that resolved in 15 minutes CT scan and MRI did not document stroke. Carotid ultrasound showed mild disease on one side, minimal on the other She was started on aspirin and discharged home. No further episodes of TIA or stroke over the past year.  Previous episodes of tachycardia, jaw pain, neck pain lasting 45 minutes up to one hour. Repeat episode 06/05/2014 with heart rate up to 140 bpm. Again some jaw pain. She reports having an episode in the past where she had jaw pain, heart rate up to 100.     Allergies  Allergen Reactions  . Codeine   . Septra [Sulfamethoxazole-Trimethoprim]   . Simvastatin   . Sulfa Antibiotics     Outpatient Encounter Prescriptions as of 11/10/2014  Medication Sig  .  albuterol (PROVENTIL HFA;VENTOLIN HFA) 108 (90 BASE) MCG/ACT inhaler Inhale into the lungs.  Marland Kitchen atorvastatin (LIPITOR) 10 MG tablet Take 10 mg by mouth every other day.   . fluticasone (FLONASE) 50 MCG/ACT nasal spray instill 2 sprays into each nostril at bedtime if needed  . levothyroxine (SYNTHROID, LEVOTHROID) 25 MCG tablet Take 25 mcg by mouth daily before breakfast.   . metoprolol succinate (TOPROL-XL) 25 MG 24 hr tablet Take 1 tablet (25 mg total) by mouth daily.  . naproxen sodium (ANAPROX) 220 MG tablet Take 220 mg by mouth as needed.  . rivaroxaban (XARELTO) 20 MG TABS tablet Take 1 tablet (20 mg total) by mouth daily with supper.   No facility-administered encounter medications on file as of 11/10/2014.    Past Medical History  Diagnosis Date  . Hyperlipidemia   . Asthma   . Arthritis   . GERD (gastroesophageal reflux disease)   . Hiatal hernia   . History of depression   . Right knee meniscal tear   . Glaucoma   . Osteoporosis   . Irritable bowel syndrome   . Perennial allergic rhinitis   . Colonic polyp   . Thyroid nodule   . Carpal tunnel syndrome   . Degenerative joint disease of cervical and lumbar spine   . TIA (transient ischemic attack)     Past Surgical History  Procedure Laterality Date  . Cervical fusion    . Back surgery      L5  . Colonoscopy    .  Cervical cone biopsy    . Cataract extraction      Social History  reports that she has quit smoking. Her smoking use included Cigarettes. She does not have any smokeless tobacco history on file. She reports that she drinks alcohol. She reports that she does not use illicit drugs.  Family History family history includes Hypertension in her sister.   Review of Systems  Constitutional: Negative.   HENT: Negative.        Jaw pain  Eyes: Negative.   Respiratory: Negative.   Cardiovascular: Positive for palpitations.       Tachycardia  Gastrointestinal: Negative.   Musculoskeletal: Positive for neck  pain.  Skin: Negative.   Neurological: Negative.   Psychiatric/Behavioral: Negative.   All other systems reviewed and are negative.   BP 122/82 mmHg  Pulse 65  Ht 5\' 3"  (1.6 m)  Wt 136 lb 8 oz (61.916 kg)  BMI 24.19 kg/m2  Physical Exam  Constitutional: She is oriented to person, place, and time. She appears well-developed and well-nourished.  HENT:  Head: Normocephalic.  Nose: Nose normal.  Mouth/Throat: Oropharynx is clear and moist.  Eyes: Conjunctivae are normal. Pupils are equal, round, and reactive to light.  Neck: Normal range of motion. Neck supple. No JVD present.  Cardiovascular: Normal rate, regular rhythm, S1 normal, S2 normal, normal heart sounds and intact distal pulses.  Exam reveals no gallop and no friction rub.   No murmur heard. Pulmonary/Chest: Effort normal and breath sounds normal. No respiratory distress. She has no wheezes. She has no rales. She exhibits no tenderness.  Abdominal: Soft. Bowel sounds are normal. She exhibits no distension. There is no tenderness.  Musculoskeletal: Normal range of motion. She exhibits no edema or tenderness.  Lymphadenopathy:    She has no cervical adenopathy.  Neurological: She is alert and oriented to person, place, and time. Coordination normal.  Skin: Skin is warm and dry. No rash noted. No erythema.  Psychiatric: She has a normal mood and affect. Her behavior is normal. Judgment and thought content normal.    Assessment and Plan  Nursing note and vitals reviewed.

## 2014-11-10 NOTE — Patient Instructions (Signed)
You are doing well. No medication changes were made.  Please call us if you have new issues that need to be addressed before your next appt.  Your physician wants you to follow-up in: 6 months.  You will receive a reminder letter in the mail two months in advance. If you don't receive a letter, please call our office to schedule the follow-up appointment.   

## 2015-02-17 ENCOUNTER — Other Ambulatory Visit: Payer: Self-pay | Admitting: *Deleted

## 2015-02-17 ENCOUNTER — Telehealth: Payer: Self-pay | Admitting: Pediatrics

## 2015-02-17 MED ORDER — RIVAROXABAN 20 MG PO TABS: 20.0000 mg | ORAL_TABLET | Freq: Every day | ORAL | Status: DC

## 2015-02-17 NOTE — Telephone Encounter (Signed)
All of these medications can be expensive when in the doughnut hole Options are limited Could check the price of alternate medication, such as eliquis or pradaxa (both of these are taken twice a day) Last option would be warfarin but would need lab checks

## 2015-02-17 NOTE — Telephone Encounter (Signed)
Pt would like to discuss blood thinner options due to cost/donut hole. Please advise.

## 2015-02-17 NOTE — Telephone Encounter (Signed)
Pt was provided w/ coupon to get 30 days free, but she is requesting something more affordable long term.

## 2015-02-17 NOTE — Telephone Encounter (Signed)
Pt calling stating she is in donut hole for Xarelto  She is calling us to send a refill to [address].  This is so she is able to get one month free.  Also would like some advise so she can keep taking this medication.  Please advise.

## 2015-02-18 MED ORDER — RIVAROXABAN 20 MG PO TABS: 20.0000 mg | ORAL_TABLET | Freq: Every day | ORAL | Status: DC

## 2015-02-18 NOTE — Telephone Encounter (Signed)
Spoke w/ pt.  Advised her of Dr. Windell Hummingbird recommendation.  She would like to stay on Xarelto for as long as possible.  She will call her ins co to discuss cheaper alternative. Does not want coumadin. Advised her that I am leaving samples of Xarelto 20 mg at the front desk for her to p/u at her convenience.

## 2015-04-07 DIAGNOSIS — M810 Age-related osteoporosis without current pathological fracture: Secondary | ICD-10-CM | POA: Insufficient documentation

## 2015-04-07 DIAGNOSIS — J4530 Mild persistent asthma, uncomplicated: Secondary | ICD-10-CM | POA: Insufficient documentation

## 2015-06-09 ENCOUNTER — Ambulatory Visit: Payer: Medicare Other | Admitting: Nurse Practitioner

## 2015-06-19 ENCOUNTER — Encounter: Payer: Self-pay | Admitting: Nurse Practitioner

## 2015-06-19 ENCOUNTER — Ambulatory Visit (INDEPENDENT_AMBULATORY_CARE_PROVIDER_SITE_OTHER): Payer: Medicare Other | Admitting: Nurse Practitioner

## 2015-06-19 VITALS — BP 110/64 | HR 71 | Ht 63.0 in | Wt 137.8 lb

## 2015-06-19 DIAGNOSIS — E785 Hyperlipidemia, unspecified: Secondary | ICD-10-CM | POA: Diagnosis not present

## 2015-06-19 DIAGNOSIS — I480 Paroxysmal atrial fibrillation: Secondary | ICD-10-CM

## 2015-06-19 DIAGNOSIS — I4891 Unspecified atrial fibrillation: Secondary | ICD-10-CM

## 2015-06-19 NOTE — Patient Instructions (Signed)
Medication Instructions:  Your physician recommends that you continue on your current medications as directed. Please refer to the Current Medication list given to you today.   Labwork: none  Testing/Procedures: none  Follow-Up: Your physician wants you to follow-up in: six months with Dr. Gollan. You will receive a reminder letter in the mail two months in advance. If you don't receive a letter, please call our office to schedule the follow-up appointment.   Any Other Special Instructions Will Be Listed Below (If Applicable).     If you need a refill on your cardiac medications before your next appointment, please call your pharmacy.   

## 2015-06-19 NOTE — Progress Notes (Signed)
Office Visit    Patient Name: Jacqueline Powers Date of Encounter: 06/19/2015  Primary Care Provider:  Rozanna Box, MD Primary Cardiologist:  Concha Se, MD   Chief Complaint    81 year old female with a history of paroxysmal atrial fibrillation who presents for follow-up.  Past Medical History    Past Medical History  Diagnosis Date  . Hyperlipidemia   . Asthma   . Arthritis   . GERD (gastroesophageal reflux disease)   . Hiatal hernia   . History of depression   . Right knee meniscal tear   . Glaucoma   . Osteoporosis   . Irritable bowel syndrome   . Perennial allergic rhinitis   . Colonic polyp   . Hashimoto's thyroiditis     a. 05/2002 s/p resection of thyroid nodule-->on replacement.  . Carpal tunnel syndrome   . Degenerative joint disease of cervical and lumbar spine   . TIA (transient ischemic attack)     a. 06/2013: LUE/LLE wkns x 15 mins, MRI neg for CVA.  Marland Kitchen PAF (paroxysmal atrial fibrillation) (HCC)     a. CHA2DS2VASc = 5-->xarelto;     Past Surgical History  Procedure Laterality Date  . Cervical fusion    . Back surgery      L5  . Colonoscopy    . Cervical cone biopsy    . Cataract extraction      Allergies  Allergies  Allergen Reactions  . Amoxicillin-Pot Clavulanate Nausea And Vomiting  . Codeine   . Septra [Sulfamethoxazole-Trimethoprim]   . Simvastatin   . Sulfa Antibiotics     History of Present Illness    81 year old female with the above past medical history. She has a history of paroxysmal atrial fibrillation and is chronically anticoagulated with xarelto in the setting of a CHA2DS2VASc of 5 with prior TIA in 2015. She was last seen in clinic approximately 6 months ago and since that time, has done reasonably well. She does experience occasional episodes of palpitations, occurring almost exclusively at bedtime, less than once a month, lasting about 30 minutes, and resolving spontaneously. Other than noting palpitations, she has  occasionally experienced lightheadedness with these symptoms but otherwise has not had chest pain or dyspnea. She does not experience palpitations during the day. 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 She denies PND, orthopnea, dizziness, syncope, edema, or early satiety.  Home Medications    Prior to Admission medications   Medication Sig Start Date End Date Taking? Authorizing Provider  albuterol (PROVENTIL HFA;VENTOLIN HFA) 108 (90 BASE) MCG/ACT inhaler Inhale 2 puffs into the lungs every 4 (four) hours as needed for wheezing or shortness of breath.  02/27/14 06/19/15 Yes Historical Provider, MD  atorvastatin (LIPITOR) 10 MG tablet Take 10 mg by mouth every other day.  11/15/13  Yes Historical Provider, MD  fluticasone (FLONASE) 50 MCG/ACT nasal spray instill 2 sprays into each nostril at bedtime if needed 02/17/14  Yes Historical Provider, MD  FLUZONE HIGH-DOSE 0.5 ML SUSY Inject as directed once. 04/01/15  Yes Historical Provider, MD  levothyroxine (SYNTHROID, LEVOTHROID) 25 MCG tablet Take 25 mcg by mouth daily before breakfast.  06/12/14  Yes Historical Provider, MD  LUMIGAN 0.01 % SOLN Place 1 drop into both eyes daily. 05/15/15  Yes Historical Provider, MD  metoprolol succinate (TOPROL-XL) 25 MG 24 hr tablet Take 1 tablet (25 mg total) by mouth daily. 08/04/14  Yes Antonieta Iba, MD  naproxen sodium (ANAPROX) 220 MG  tablet Take 220 mg by mouth as needed.   Yes Historical Provider, MD  rivaroxaban (XARELTO) 20 MG TABS tablet Take 1 tablet (20 mg total) by mouth daily with supper. 02/18/15  Yes Antonieta Iba, MD    Review of Systems    She experiences palpitations that she associates with AF less than once/month, approx 4 x since her last visit. She denies chest pain, dyspnea, pnd, orthopnea, n, v, dizziness, syncope, edema, weight gain, or early satiety.   All other systems reviewed and are otherwise negative except as noted above.  Physical Exam    VS:  BP  110/64 mmHg  Pulse 71  Ht  (1.6 m)  Wt 137 lb 12.8 oz (62.506 kg)  BMI 24.42 kg/m2 , BMI Body mass index is 24.42 kg/(m^2). GEN: Well nourished, well developed, in no acute distress. HEENT: normal. Neck: Supple, no JVD, carotid bruits, or masses. Cardiac: RRR, no murmurs, rubs, or gallops. No clubbing, cyanosis, edema.  Radials/DP/PT 2+ and equal bilaterally.  Respiratory:  Respirations regular and unlabored, clear to auscultation bilaterally. GI: Soft, nontender, nondistended, BS + x 4. MS: no deformity or atrophy. Skin: warm and dry, no rash. Neuro:  Strength and sensation are intact. Psych: Normal affect.  Accessory Clinical Findings    ECG - regular sinus rhythm, 71, no acute ST or T changes.  Assessment & Plan    1.  Paroxysmal atrial fibrillation: Patient is in sinus rhythm today. Over the past 6 months, she has done reasonably well. She notes about 4 episodes of paroxysmal atrial fibrillation since her last visit, occurring exclusively at bedtime, lasting about 30 minutes, and resolving spontaneously. She remains on Toprol-XL 25 mg daily as well as xarelto. She is not particularly bothered by her paroxysms. I did review lab work that she had in November with her primary care provider. Renal function remains normal and thus there is no requirement for dose adjustment of her xarelto.  2. Hyperlipidemia: This is followed by her primary care provider. She had normal LFTs in November and remains on Lipitor 10 mg.  3. Disposition: Follow-up with Dr. Mariah Milling in 6 months or sooner if necessary.  Nicolasa Ducking, NP 06/19/2015, 4:56 PM

## 2015-07-25 ENCOUNTER — Other Ambulatory Visit: Payer: Self-pay | Admitting: Cardiovascular Disease

## 2015-07-27 NOTE — Telephone Encounter (Signed)
Requested Prescriptions   Pending Prescriptions Disp Refills  . metoprolol succinate (TOPROL-XL) 25 MG 24 hr tablet [Pharmacy Med Name: METOPROLOL SUCCINATE ER TABS ] 90 tablet 3    Sig: TAKE 1 TABLET DAILY

## 2015-08-22 ENCOUNTER — Other Ambulatory Visit: Payer: Self-pay | Admitting: Cardiovascular Disease

## 2016-01-28 ENCOUNTER — Ambulatory Visit (INDEPENDENT_AMBULATORY_CARE_PROVIDER_SITE_OTHER): Payer: Medicare Other | Admitting: Cardiovascular Disease

## 2016-01-28 ENCOUNTER — Encounter: Payer: Self-pay | Admitting: Cardiovascular Disease

## 2016-01-28 VITALS — BP 120/76 | HR 72 | Ht 63.5 in | Wt 133.5 lb

## 2016-01-28 DIAGNOSIS — G458 Other transient cerebral ischemic attacks and related syndromes: Secondary | ICD-10-CM

## 2016-01-28 DIAGNOSIS — I480 Paroxysmal atrial fibrillation: Secondary | ICD-10-CM

## 2016-01-28 DIAGNOSIS — E785 Hyperlipidemia, unspecified: Secondary | ICD-10-CM

## 2016-01-28 DIAGNOSIS — R079 Chest pain, unspecified: Secondary | ICD-10-CM | POA: Diagnosis not present

## 2016-01-28 DIAGNOSIS — Z7189 Other specified counseling: Secondary | ICD-10-CM

## 2016-01-28 NOTE — Progress Notes (Signed)
Cardiology Office Note  Date:  01/28/2016   ID:  Jacqueline Powers, DOB 06/26/1934, MRN [id number]  PCP:  No primary care provider on file.   Chief Complaint  Patient presents with  . Other    6 month f/u c/o rapid heart beat at bedtime. Pt D/c c/o joint pain. Meds reviewed verbally with pt.    HPI:  Jacqueline Powers is a pleasant 81 year old woman with a history of hyperlipidemia, Hashimoto's thyroiditis with elevated TSH, osteoporosis, remote history of chest pain, GERD, previously evaluated for chest pain. Previously seen for symptoms of jaw pain, neck pain, tachycardia. She does have a prior history of neck  surgery, fusion On a previous office visit, we ordered a 30 day monitor. Monitor documented atrial fibrillation. She presents today for follow-up of her atrial fibrillation  In follow-up today, she reports significant Stress the past 6 months She feels this could be contributing to rare episodes of atrial fibrillation Had episode of Atrial fib two nights ago, lasting 5-10 minutes Typically episodes are rare, did not last very long  Has not been taking her lipitor, feels it is affecting her thinking  not thinking clearly  EKG on today's visit shows normal sinus rhythm with rate 72 bpm, no significant ST or T-wave changes  Other past medical history 30 day monitor shows atrial fibrillation on February 10 lasting for several hours, 9:30 at night until at least 1 AM on 07/10/2014   TIA February 2015.  She had left upper extremity, left lower extremity weakness that resolved in 15 minutes CT scan and MRI did not document stroke. Carotid ultrasound showed mild disease on one side, minimal on the other She was started on aspirin and discharged home. No further episodes of TIA or stroke over the past year.  Previous episodes of tachycardia, jaw pain, neck pain lasting 45 minutes up to one hour. Repeat episode 06/05/2014 with heart rate up to 140 bpm. Again some jaw pain. She reports  having an episode in the past where she had jaw pain, heart rate up to 100.    PMH:   has a past medical history of Arthritis; Asthma; Carpal tunnel syndrome; Colonic polyp; Degenerative joint disease of cervical and lumbar spine; GERD (gastroesophageal reflux disease); Glaucoma; Hashimoto's thyroiditis; Hiatal hernia; History of depression; Hyperlipidemia; Irritable bowel syndrome; Osteoporosis; PAF (paroxysmal atrial fibrillation) (HCC); Perennial allergic rhinitis; Right knee meniscal tear; and TIA (transient ischemic attack).  PSH:    Past Surgical History:  Procedure Laterality Date  . BACK SURGERY     L5  . CATARACT EXTRACTION    . CERVICAL CONE BIOPSY    . CERVICAL FUSION    . COLONOSCOPY      Current Outpatient Prescriptions  Medication Sig Dispense Refill  . fluticasone (FLONASE) 50 MCG/ACT nasal spray instill 2 sprays into each nostril at bedtime if needed    . FLUZONE HIGH-DOSE 0.5 ML SUSY Inject as directed once.  0  . levothyroxine (SYNTHROID, LEVOTHROID) 25 MCG tablet Take 25 mcg by mouth daily before breakfast.     . LUMIGAN 0.01 % SOLN Place 1 drop into both eyes daily.    . metoprolol succinate (TOPROL-XL) 25 MG 24 hr tablet TAKE 1 TABLET DAILY 90 tablet 3  . naproxen sodium (ANAPROX) 220 MG tablet Take 220 mg by mouth as needed.    . rivaroxaban (XARELTO) 20 MG TABS tablet Take 1 tablet (20 mg total) by mouth daily with supper. 30 tablet 3   No current facility-administered  medications for this visit.      Allergies:   Amoxicillin-pot clavulanate; Codeine; Other; Septra [sulfamethoxazole-trimethoprim]; Simvastatin; and Sulfa antibiotics   Social History:  The patient  reports that she has never smoked. She has never used smokeless tobacco. She reports that she drinks alcohol. She reports that she does not use drugs.   Family History:   family history includes Hypertension in her sister.    Review of Systems: Review of Systems  Constitutional: Negative.    Respiratory: Negative.   Cardiovascular: Positive for palpitations.  Gastrointestinal: Negative.   Musculoskeletal: Negative.   Neurological: Negative.   Psychiatric/Behavioral: The patient is nervous/anxious.   All other systems reviewed and are negative.    PHYSICAL EXAM: VS:  BP 120/76 (BP Location: Left Arm, Patient Position: Sitting, Cuff Size: Normal)   Pulse 72   Ht 5' 3.5" (1.613 m)   Wt 133 lb 8 oz (60.6 kg)   BMI 23.28 kg/m  , BMI Body mass index is 23.28 kg/m. GEN: Well nourished, well developed, in no acute distress  HEENT: normal  Neck: no JVD, carotid bruits, or masses Cardiac: RRR; no murmurs, rubs, or gallops,no edema  Respiratory:  clear to auscultation bilaterally, normal work of breathing GI: soft, nontender, nondistended, + BS MS: no deformity or atrophy  Skin: warm and dry, no rash Neuro:  Strength and sensation are intact Psych: euthymic mood, full affect    Recent Labs: No results found for requested labs within last 8760 hours.    Lipid Panel No results found for: CHOL, HDL, LDLCALC, TRIG    Wt Readings from Last 3 Encounters:  01/28/16 133 lb 8 oz (60.6 kg)  06/19/15 137 lb 12.8 oz (62.5 kg)  11/10/14 136 lb 8 oz (61.9 kg)       ASSESSMENT AND PLAN:  Paroxysmal atrial fibrillation (HCC) - Plan: EKG 12-Lead Rare brief episodes of arrhythmia Recommended she closely monitor her symptoms, call our office if these become more frequent, last longer. Antiarrhythmic medications could be started  Other specified transient cerebral ischemias She denies having any TIA or stroke type symptoms  Hyperlipidemia Discussed her cholesterol, other screening options for coronary disease Suggested she consider CT coronary calcium scoring to help guide whether she needs aggressive cholesterol management  Chest pain, unspecified chest pain type As above, consider CT screening Currently not having chest pain symptoms  Encounter for anticoagulation  discussion and counseling Discussed anticoagulation for atrial fibrillation. Currently not interested Recommended if she has more frequent or episodes lasting longer, that she call our office.    Total encounter time more than 25 minutes  Greater than 50% was spent in counseling and coordination of care with the patient   Disposition:   F/U  6 months   Orders Placed This Encounter  Procedures  . EKG 12-Lead     Signed, Dossie Arbour, M.D., Ph.D. 01/28/2016  [facility] [place], [place] [phone]

## 2016-01-28 NOTE — Patient Instructions (Addendum)
Medication Instructions:   No medication changes made  Please call if you have frequent episodes of atrial flibrillation  Labwork:  No new labs needed  Testing/Procedures:  No further testing at this time  Doctors: Dr. Sherie DonLada [facility] Para Marchuncan or [facility]utierrez [facility]  Research CT coronary calcium score on google $150  Follow-Up: It was a pleasure seeing you in the office today. Please call us if you have new issues that need to be addressed before your next appt.  [phone]  Your physician wants you to follow-up in: 12 months.  You will receive a reminder letter in the mail two months in advance. If you don't receive a letter, please call our office to schedule the follow-up appointment.  If you need a refill on your cardiac medications before your next appointment, please call your pharmacy.

## 2016-02-22 ENCOUNTER — Telehealth: Payer: Self-pay | Admitting: Cardiovascular Disease

## 2016-02-22 DIAGNOSIS — R079 Chest pain, unspecified: Secondary | ICD-10-CM

## 2016-02-22 NOTE — Telephone Encounter (Signed)
Pt is ready to schedule her CT calcium score test. Please call.

## 2016-02-22 NOTE — Telephone Encounter (Signed)
Spoke w/ pt.  Advised her that I am entering the order for her CT Calcium Score. Provided her w/ # to the [place] office so that she can set this up at her convenience.  She will decide if she would like to drive that far or not.

## 2016-03-01 ENCOUNTER — Ambulatory Visit (INDEPENDENT_AMBULATORY_CARE_PROVIDER_SITE_OTHER)
Admission: RE | Admit: 2016-03-01 | Discharge: 2016-03-01 | Disposition: A | Discharge status: Home / Self Care | Payer: Medicare Other | Source: Ambulatory Visit | Attending: Cardiovascular Disease | Admitting: Cardiovascular Disease

## 2016-03-01 DIAGNOSIS — R079 Chest pain, unspecified: Secondary | ICD-10-CM

## 2016-03-02 ENCOUNTER — Other Ambulatory Visit: Payer: Medicare Other

## 2016-03-07 ENCOUNTER — Other Ambulatory Visit: Payer: Self-pay

## 2016-03-07 DIAGNOSIS — I2510 Atherosclerotic heart disease of native coronary artery without angina pectoris: Secondary | ICD-10-CM

## 2016-03-08 ENCOUNTER — Other Ambulatory Visit (INDEPENDENT_AMBULATORY_CARE_PROVIDER_SITE_OTHER): Payer: Medicare Other

## 2016-03-08 DIAGNOSIS — I2510 Atherosclerotic heart disease of native coronary artery without angina pectoris: Secondary | ICD-10-CM | POA: Diagnosis not present

## 2016-03-09 LAB — LIPID PANEL
Chol/HDL Ratio: 3.9 ratio units (ref 0.0–4.4)
Cholesterol, Total: 235 mg/dL — ABNORMAL HIGH (ref 100–199)
HDL: 61 mg/dL (ref >39)
LDL Calculated: 155 mg/dL — ABNORMAL HIGH (ref 0–99)
Triglycerides: 94 mg/dL (ref 0–149)
VLDL Cholesterol Cal: 19 mg/dL (ref 5–40)

## 2016-03-09 LAB — HEPATIC FUNCTION PANEL
ALK PHOS: 85 IU/L (ref 39–117)
ALT: 15 IU/L (ref 0–32)
AST: 24 IU/L (ref 0–40)
Albumin: 4.1 g/dL (ref 3.5–4.8)
BILIRUBIN TOTAL: 0.4 mg/dL (ref 0.0–1.2)
BILIRUBIN, DIRECT: 0.07 mg/dL (ref 0.00–0.40)
Total Protein: 6.4 g/dL (ref 6.0–8.5)

## 2016-03-14 ENCOUNTER — Telehealth: Payer: Self-pay | Admitting: Cardiovascular Disease

## 2016-03-14 ENCOUNTER — Other Ambulatory Visit: Payer: Self-pay

## 2016-03-14 MED ORDER — ROSUVASTATIN CALCIUM 5 MG PO TABS: 5.0000 mg | ORAL_TABLET | ORAL | 6 refills | Status: DC

## 2016-03-14 MED ORDER — EZETIMIBE 10 MG PO TABS: 10.0000 mg | ORAL_TABLET | Freq: Every day | ORAL | 6 refills | Status: DC

## 2016-03-14 NOTE — Telephone Encounter (Signed)
Pt takes Xarelto 20mg  once daily. Her husband recently passed away and had leftover Xarelto 15 mg that he took BID. Pt would like to know if she can take his meds since it is so expensive.  Advised her that I will make Dr. Mariah MillingGollan aware and call her back w/ his recommendation.

## 2016-03-15 NOTE — Telephone Encounter (Signed)
xarelto 15 mg will not work unfortunately, only used once a day in the setting of renal failure Would stay on 20 mg daily

## 2016-03-15 NOTE — Telephone Encounter (Signed)
Spoke w/ pt.  Advised her of Dr. Gollan's recommendation. She verbalizes understanding and is appreciative of the call.  

## 2016-03-26 ENCOUNTER — Emergency Department
Admission: EM | Admit: 2016-03-26 | Discharge: 2016-03-26 | Disposition: A | Discharge status: Home / Self Care | Payer: Medicare Other | Attending: Emergency Medicine | Admitting: Emergency Medicine

## 2016-03-26 ENCOUNTER — Encounter: Payer: Self-pay | Admitting: Urgent Care

## 2016-03-26 DIAGNOSIS — I480 Paroxysmal atrial fibrillation: Secondary | ICD-10-CM | POA: Diagnosis not present

## 2016-03-26 DIAGNOSIS — J45909 Unspecified asthma, uncomplicated: Secondary | ICD-10-CM | POA: Diagnosis not present

## 2016-03-26 DIAGNOSIS — R002 Palpitations: Secondary | ICD-10-CM | POA: Diagnosis present

## 2016-03-26 DIAGNOSIS — Z79899 Other long term (current) drug therapy: Secondary | ICD-10-CM | POA: Diagnosis not present

## 2016-03-26 DIAGNOSIS — I4891 Unspecified atrial fibrillation: Secondary | ICD-10-CM

## 2016-03-26 LAB — CBC WITH DIFFERENTIAL/PLATELET
BASOS ABS: 0.1 10*3/uL (ref 0–0.1)
BASOS PCT: 1 %
Eosinophils Absolute: 0.3 10*3/uL (ref 0–0.7)
Eosinophils Relative: 4 %
HEMATOCRIT: 44.5 % (ref 35.0–47.0)
HEMOGLOBIN: 15.2 g/dL (ref 12.0–16.0)
Lymphocytes Relative: 26 %
Lymphs Abs: 2.4 10*3/uL (ref 1.0–3.6)
MCH: 31.4 pg (ref 26.0–34.0)
MCHC: 34.1 g/dL (ref 32.0–36.0)
MCV: 91.9 fL (ref 80.0–100.0)
MONOS PCT: 9 %
Monocytes Absolute: 0.8 10*3/uL (ref 0.2–0.9)
NEUTROS ABS: 5.6 10*3/uL (ref 1.4–6.5)
NEUTROS PCT: 60 %
Platelets: 214 10*3/uL (ref 150–440)
RBC: 4.84 MIL/uL (ref 3.80–5.20)
RDW: 13.2 % (ref 11.5–14.5)
WBC: 9.3 10*3/uL (ref 3.6–11.0)

## 2016-03-26 LAB — TROPONIN I: Troponin I: <0.03 ng/mL (ref <0.03)

## 2016-03-26 LAB — BASIC METABOLIC PANEL
ANION GAP: 11 (ref 5–15)
BUN: 19 mg/dL (ref 6–20)
CHLORIDE: 102 mmol/L (ref 101–111)
CO2: 28 mmol/L (ref 22–32)
Calcium: 9.4 mg/dL (ref 8.9–10.3)
Creatinine, Ser: 0.81 mg/dL (ref 0.44–1.00)
GFR calc non Af Amer: >60 mL/min (ref >60)
Glucose, Bld: 114 mg/dL — ABNORMAL HIGH (ref 65–99)
POTASSIUM: 3.4 mmol/L — AB (ref 3.5–5.1)
Sodium: 141 mmol/L (ref 135–145)

## 2016-03-26 NOTE — Discharge Instructions (Signed)
Please seek medical attention for any high fevers, chest pain, shortness of breath, change in behavior, persistent vomiting, bloody stool or any other new or concerning symptoms.  

## 2016-03-26 NOTE — ED Provider Notes (Signed)
[facility] Emergency Department Provider Note    ____________________________________________   I have reviewed the triage vital signs and the nursing notes.   HISTORY  Chief Complaint Chest Pain and Palpitations   History limited by: Not Limited   HPI Jacqueline Powers is a 81 y.o. female who presents to the emergency department today via EMS because of concern for chest tightness and palpitations. Patient has a history of paroxysmal atrial fibrillation. States she woke up in the middle of the night tonight with feelings of chest tightness and palpitations. The pain did radiate up her jaw. She states that this is typically how she feels when she is in afib. She checked her pulse at home and it said it was in the 150s. She does take metoprolol for her afib as well as a blood thinner. Denies any recent illness.   Past Medical History:  Diagnosis Date  . Arthritis   . Asthma   . Carpal tunnel syndrome   . Colonic polyp   . Degenerative joint disease of cervical and lumbar spine   . GERD (gastroesophageal reflux disease)   . Glaucoma   . Hashimoto's thyroiditis    a. 05/2002 s/p resection of thyroid nodule-->on replacement.  . Hiatal hernia   . History of depression   . Hyperlipidemia   . Irritable bowel syndrome   . Osteoporosis   . PAF (paroxysmal atrial fibrillation) (HCC)    a. CHA2DS2VASc = 5-->xarelto;    . Perennial allergic rhinitis   . Right knee meniscal tear   . TIA (transient ischemic attack)    a. 06/2013: LUE/LLE wkns x 15 mins, MRI neg for CVA.    Patient Active Problem List   Diagnosis Date Noted  . PAF (paroxysmal atrial fibrillation) (HCC)   . Encounter for anticoagulation discussion and counseling 11/10/2014  . Atrial fibrillation (HCC) 08/04/2014  . Tachycardia 06/23/2014  . TIA (transient ischemic attack) 06/23/2014  . Hashimoto's thyroiditis 06/23/2014  . Jaw pain, non-TMJ 06/23/2014  . Neck pain 06/23/2014  . Chest pain  08/17/2012  . Hyperlipidemia 08/17/2012    Past Surgical History:  Procedure Laterality Date  . BACK SURGERY     L5  . CATARACT EXTRACTION    . CERVICAL CONE BIOPSY    . CERVICAL FUSION    . COLONOSCOPY      Prior to Admission medications   Medication Sig Start Date End Date Taking? Authorizing Provider  ezetimibe (ZETIA) 10 MG tablet Take 1 tablet (10 mg total) by mouth daily. 03/14/16 04/13/16  Antonieta Ibaimothy J Gollan, MD  fluticasone (FLONASE) 50 MCG/ACT nasal spray instill 2 sprays into each nostril at bedtime if needed 02/17/14   Historical Provider, MD  FLUZONE HIGH-DOSE 0.5 ML SUSY Inject as directed once. 04/01/15   Historical Provider, MD  levothyroxine (SYNTHROID, LEVOTHROID) 25 MCG tablet Take 25 mcg by mouth daily before breakfast.  06/12/14   Historical Provider, MD  LUMIGAN 0.01 % SOLN Place 1 drop into both eyes daily. 05/15/15   Historical Provider, MD  metoprolol succinate (TOPROL-XL) 25 MG 24 hr tablet TAKE 1 TABLET DAILY 07/27/15   Antonieta Ibaimothy J Gollan, MD  naproxen sodium (ANAPROX) 220 MG tablet Take 220 mg by mouth as needed.    Historical Provider, MD  rivaroxaban (XARELTO) 20 MG TABS tablet Take 1 tablet (20 mg total) by mouth daily with supper. 02/18/15   Antonieta Ibaimothy J Gollan, MD  rosuvastatin (CRESTOR) 5 MG tablet Take 1 tablet (5 mg total) by mouth every  other day. 03/14/16 06/12/16  Antonieta Iba, MD    Allergies Amoxicillin-pot clavulanate; Codeine; Other; Septra [sulfamethoxazole-trimethoprim]; Simvastatin; and Sulfa antibiotics  Family History  Problem Relation Age of Onset  . Hypertension Sister     Social History Social History  Substance Use Topics  . Smoking status: Never Smoker  . Smokeless tobacco: Never Used  . Alcohol use Yes     Comment: occasional    Review of Systems  Constitutional: Negative for fever. Cardiovascular: Positive for chest pain. Respiratory: Negative for shortness of breath. Gastrointestinal: Negative for abdominal pain, vomiting  and diarrhea. Genitourinary: Negative for dysuria. Neurological: Negative for headaches, focal weakness or numbness.   10-point ROS otherwise negative.  ____________________________________________   PHYSICAL EXAM:  VITAL SIGNS: ED Triage Vitals  Enc Vitals Group     BP 03/26/16 0113 105/76     Pulse Rate 03/26/16 0113 (!) 162     Resp 03/26/16 0113 18     Temp 03/26/16 0113 98.3 F (36.8 C)     Temp Source 03/26/16 0113 Oral     SpO2 --      Weight 03/26/16 0111 132 lb (59.9 kg)     Height 03/26/16 0111 5\' 3"  (1.6 m)     Head Circumference --      Peak Flow --      Pain Score 03/26/16 0111 9     Pain Loc --      Pain Edu? --      Excl. in GC? --     Constitutional: Alert and oriented. Well appearing and in no distress. Eyes: Conjunctivae are normal. Normal extraocular movements. ENT   Head: Normocephalic and atraumatic.   Nose: No congestion/rhinnorhea.   Mouth/Throat: Mucous membranes are moist.   Neck: No stridor. Hematological/Lymphatic/Immunilogical: No cervical lymphadenopathy. Cardiovascular: Normal rate, regular rhythm.  No murmurs, rubs, or gallops.  Respiratory: Normal respiratory effort without tachypnea nor retractions. Breath sounds are clear and equal bilaterally. No wheezes/rales/rhonchi. Gastrointestinal: Soft and nontender. No distention.  Genitourinary: Deferred Musculoskeletal: Normal range of motion in all extremities. No lower extremity edema. Neurologic:  Normal speech and language. No gross focal neurologic deficits are appreciated.  Skin:  Skin is warm, dry and intact. No rash noted. Psychiatric: Mood and affect are normal. Speech and behavior are normal. Patient exhibits appropriate insight and judgment.  ____________________________________________    LABS (pertinent positives/negatives)  Labs Reviewed  BASIC METABOLIC PANEL - Abnormal; Notable for the following:       Result Value   Potassium 3.4 (*)    Glucose, Bld 114  (*)    All other components within normal limits  CBC WITH DIFFERENTIAL/PLATELET  TROPONIN I     ____________________________________________   EKG  I, Phineas Semen, attending physician, personally viewed and interpreted this EKG  EKG Time: 0112 Rate: 156 Rhythm: atrial fibrillation Axis: normal Intervals: qtc 467 QRS: narrow ST changes: no st elevation Impression: abnormal ekg  I, Phineas Semen, attending physician, personally viewed and interpreted this EKG  EKG Time: 0127 Rate: 103 Rhythm: sinus tachycardia Axis: normal Intervals: qtc 468 QRS: narrow ST changes: no st elevation Impression: abnormal ekg   ____________________________________________    RADIOLOGY  None  ____________________________________________   PROCEDURES  Procedures  ____________________________________________   INITIAL IMPRESSION / ASSESSMENT AND PLAN / ED COURSE  Pertinent labs & imaging results that were available during my care of the patient were reviewed by me and considered in my medical decision making (see chart for details).  Patient  presented initially with afib with rvr. Converted to sinus rhythm while here in the emergency department. Stated she did feel better after she converted. Blood work without concerning findings. Will discharge to follow up with cardiologist.   ____________________________________________   FINAL CLINICAL IMPRESSION(S) / ED DIAGNOSES  Final diagnoses:  Atrial fibrillation with RVR (HCC)     Note: This dictation was prepared with Dragon dictation. Any transcriptional errors that result from this process are unintentional     Phineas SemenGraydon Thermon Zulauf, MD 03/26/16 [id number]

## 2016-03-26 NOTE — ED Triage Notes (Signed)
Patient presents to the ED with c/o chest pain that awakened her from her sleep tonight. Patient reports that pain goes into jaw and back. Denies N/V, SOB, and diaphoresis. Patient with PMH significant for A.fib; presents in A.fib with RVR at a rate in the 160s.

## 2016-03-28 ENCOUNTER — Telehealth: Payer: Self-pay | Admitting: Cardiovascular Disease

## 2016-03-28 NOTE — Telephone Encounter (Signed)
She needs an ED f/u visit.   Does Jacqueline Powers have anything open this week?

## 2016-03-28 NOTE — Telephone Encounter (Signed)
Lmov to call back and schedule ED fu ° °

## 2016-03-28 NOTE — Telephone Encounter (Signed)
Pt states she was in the ED with afib this past Saturday 10/28. She wanted to let us know. Please call.

## 2016-03-30 ENCOUNTER — Ambulatory Visit (INDEPENDENT_AMBULATORY_CARE_PROVIDER_SITE_OTHER): Payer: Medicare Other | Admitting: Cardiovascular Disease

## 2016-03-30 ENCOUNTER — Encounter: Payer: Self-pay | Admitting: Cardiovascular Disease

## 2016-03-30 VITALS — BP 110/60 | HR 73 | Ht 63.0 in | Wt 133.8 lb

## 2016-03-30 DIAGNOSIS — G458 Other transient cerebral ischemic attacks and related syndromes: Secondary | ICD-10-CM | POA: Diagnosis not present

## 2016-03-30 DIAGNOSIS — I480 Paroxysmal atrial fibrillation: Secondary | ICD-10-CM | POA: Diagnosis not present

## 2016-03-30 MED ORDER — DILTIAZEM HCL 30 MG PO TABS
30.0000 mg | ORAL_TABLET | Freq: Three times a day (TID) | ORAL | 4 refills | Status: DC | PRN
Start: 2016-03-30 — End: 2017-08-24

## 2016-03-30 NOTE — Progress Notes (Signed)
Cardiology Office Note  Date:  03/30/2016   ID:  Jacqueline Powers, DOB Apr 29, 1935, MRN [id number]  PCP:  Margaretann Loveless, MD   Chief Complaint  Patient presents with  . other    Follow up from [facility] ER; A-fib. Meds reviewed by the pt. verbally. "doing well."     HPI:  Jacqueline Powers is a pleasant 81 year old woman with a history of hyperlipidemia, Hashimoto's thyroiditis with elevated TSH, osteoporosis, remote history of chest pain, GERD, previously evaluated for chest pain. Previously seen for symptoms of jaw pain, neck pain, tachycardia. She does have a prior history of neck  surgery, fusion On a previous office visit, we ordered a 30 day monitor. Monitor documented atrial fibrillation. She presents today for follow-up of her atrial fibrillation And for follow-up of her CT coronary calcium score (score of 17)  In follow-up, she had recent visits in the emergency room  03/26/16: atrial fib rate 150 bpm She was sleeping when she was woken up by severe tachycardia She went to the emergency room, heart rate up to 150 bp  EKG documenting atrial fibrillation. She converted to normal sinus rhythm on her own without intervention   she is leaving to go to the [place], concerned she may have recurrent episode .  CT scan imaging reviewed with her showing mild coronary calcification, mild descending aortic calcification   images reviewed with her in detail  EKG on today's visit shows normal sinus rhythm with rate 73 bpm, no significant ST or T-wave changes  Other past medical history reviewed  Last episode of atrial fibrillation 03/26/2016  Prior to that had episode in August 2017  lasting 5-10 minutes Typically episodes are rare, do not last very long  Has not been taking her lipitor, feels it is affecting her thinking  30 day monitor shows atrial fibrillation on February 10 lasting for several hours, 9:30 at night until at least 1 AM on 07/10/2014   TIA February 2015.  She had left upper  extremity, left lower extremity weakness that resolved in 15 minutes CT scan and MRI did not document stroke. Carotid ultrasound showed mild disease on one side, minimal on the other She was started on aspirin and discharged home. No further episodes of TIA or stroke over the past year.  Previous episodes of tachycardia, jaw pain, neck pain lasting 45 minutes up to one hour. Repeat episode 06/05/2014 with heart rate up to 140 bpm. Again some jaw pain. She reports having an episode in the past where she had jaw pain, heart rate up to 100.   PMH:   has a past medical history of Arthritis; Asthma; Carpal tunnel syndrome; Colonic polyp; Degenerative joint disease of cervical and lumbar spine; GERD (gastroesophageal reflux disease); Glaucoma; Hashimoto's thyroiditis; Hiatal hernia; History of depression; Hyperlipidemia; Irritable bowel syndrome; Osteoporosis; PAF (paroxysmal atrial fibrillation) (HCC); Perennial allergic rhinitis; Right knee meniscal tear; and TIA (transient ischemic attack).  PSH:    Past Surgical History:  Procedure Laterality Date  . BACK SURGERY     L5  . CATARACT EXTRACTION    . CERVICAL CONE BIOPSY    . CERVICAL FUSION    . COLONOSCOPY      Current Outpatient Prescriptions  Medication Sig Dispense Refill  . ezetimibe (ZETIA) 10 MG tablet Take 1 tablet (10 mg total) by mouth daily. 30 tablet 6  . fluticasone (FLONASE) 50 MCG/ACT nasal spray instill 2 sprays into each nostril at bedtime if needed    . FLUZONE HIGH-DOSE 0.5  ML SUSY Inject as directed once.  0  . levothyroxine (SYNTHROID, LEVOTHROID) 25 MCG tablet Take 25 mcg by mouth daily before breakfast.     . LUMIGAN 0.01 % SOLN Place 1 drop into both eyes daily.    . metoprolol succinate (TOPROL-XL) 25 MG 24 hr tablet TAKE 1 TABLET DAILY 90 tablet 3  . naproxen sodium (ANAPROX) 220 MG tablet Take 220 mg by mouth as needed.    . rivaroxaban (XARELTO) 20 MG TABS tablet Take 1 tablet (20 mg total) by mouth daily  with supper. 30 tablet 3  . rosuvastatin (CRESTOR) 5 MG tablet Take 1 tablet (5 mg total) by mouth every other day. 15 tablet 6   No current facility-administered medications for this visit.      Allergies:   Amoxicillin-pot clavulanate; Codeine; Other; Septra [sulfamethoxazole-trimethoprim]; Simvastatin; and Sulfa antibiotics   Social History:  The patient  reports that she has never smoked. She has never used smokeless tobacco. She reports that she drinks alcohol. She reports that she does not use drugs.   Family History:   family history includes Hypertension in her sister.    Review of Systems: Review of Systems  Constitutional: Negative.   Respiratory: Negative.   Cardiovascular: Positive for palpitations.       Tachycardia  Gastrointestinal: Negative.   Musculoskeletal: Negative.   Neurological: Negative.   Psychiatric/Behavioral: The patient is nervous/anxious.   All other systems reviewed and are negative.    PHYSICAL EXAM: VS:  BP 110/60 (BP Location: Left Arm, Patient Position: Sitting, Cuff Size: Normal)   Pulse 73   Ht 5\' 3"  (1.6 m)   Wt 133 lb 12 oz (60.7 kg)   BMI 23.69 kg/m  , BMI Body mass index is 23.69 kg/m. GEN: Well nourished, well developed, in no acute distress  HEENT: normal  Neck: no JVD, carotid bruits, or masses Cardiac: RRR; no murmurs, rubs, or gallops,no edema  Respiratory:  clear to auscultation bilaterally, normal work of breathing GI: soft, nontender, nondistended, + BS MS: no deformity or atrophy  Skin: warm and dry, no rash Neuro:  Strength and sensation are intact Psych: euthymic mood, full affect    Recent Labs: 03/08/2016: ALT 15 03/26/2016: BUN 19; Creatinine, Ser 0.81; Hemoglobin 15.2; Platelets 214; Potassium 3.4; Sodium 141    Lipid Panel Lab Results  Component Value Date   CHOL 235 (H) 03/08/2016   HDL 61 03/08/2016   LDLCALC 155 (H) 03/08/2016   TRIG 94 03/08/2016      Wt Readings from Last 3 Encounters:   03/30/16 133 lb 12 oz (60.7 kg)  03/26/16 132 lb (59.9 kg)  01/28/16 133 lb 8 oz (60.6 kg)       ASSESSMENT AND PLAN:  Paroxysmal atrial fibrillation (HCC) - Plan: EKG 12-Lead Recent episode several days ago, converted to normal sinus rhythm on her own. Recommended she have a emergency regiment, suggested she take extra metoprolol and diltiazem 30 mg as needed for breakthrough arrhythmia. If these become more frequent, last longer. Antiarrhythmic medications could be started  Other specified transient cerebral ischemias She denies having any TIA or stroke type symptoms  Hyperlipidemia She is tolerating Crestor 5 mgrams 2 days per week, also taking  zetia given mild aortic and coronary atherosclerosis   Chest pain, unspecified chest pain type Denies any significant chest pain on today's visit   Encounter for anticoagulation discussion and counseling Currently not interested Recommended if she has more frequent or episodes lasting longer, that she  call our office.    Total encounter time more than 25 minutes  Greater than 50% was spent in counseling and coordination of care with the patient   Disposition:   F/U  6 months   Orders Placed This Encounter  Procedures  . EKG 12-Lead     Signed, Dossie Arbourim Coni Homesley, M.D., Ph.D. 03/30/2016  [facility], [place] [phone]

## 2016-03-30 NOTE — Patient Instructions (Signed)
Medication Instructions:   Please take extra metoprolol and diltiazem if you go into atrial fibrillation If you do not go back into normal rhythm after 2 hours, Take an additional diltiazem  Labwork:  No new labs needed  Testing/Procedures:  No further testing at this time   Follow-Up: It was a pleasure seeing you in the office today. Please call us if you have new issues that need to be addressed before your next appt.  [phone]  Your physician wants you to follow-up in: 6 months.  You will receive a reminder letter in the mail two months in advance. If you don't receive a letter, please call our office to schedule the follow-up appointment.  If you need a refill on your cardiac medications before your next appointment, please call your pharmacy.

## 2016-08-01 ENCOUNTER — Other Ambulatory Visit: Payer: Self-pay | Admitting: Cardiovascular Disease

## 2016-08-02 ENCOUNTER — Telehealth: Payer: Self-pay | Admitting: Cardiovascular Disease

## 2016-08-02 ENCOUNTER — Other Ambulatory Visit: Payer: Self-pay

## 2016-08-02 MED ORDER — METOPROLOL SUCCINATE ER 25 MG PO TB24: 25.0000 mg | ORAL_TABLET | Freq: Every day | ORAL | 0 refills | Status: DC

## 2016-08-02 NOTE — Telephone Encounter (Signed)
Received cardiac clearance request for pt to proceed w/ [facility] w/ monitored [facility].   Please route clearance and recommendations on pt's [facility]arelto to [facility] @ [phone].

## 2016-08-02 NOTE — Telephone Encounter (Signed)
°*  STAT* If patient is at the pharmacy, call can be transferred to refill team.   1. Which medications need to be refilled? (please list name of each medication and dose if known)  Metoprolol   2. Which pharmacy/location (including street and city if local pharmacy) is medication to be sent to? [facility] on [address]   3. Do they need a 30 day or 90 day supply? Just needs about 2 weeks until her mail order comes

## 2016-08-02 NOTE — Telephone Encounter (Signed)
Requested Prescriptions   Signed Prescriptions Disp Refills  . metoprolol succinate (TOPROL-XL) 25 MG 24 hr tablet 30 tablet 0    Sig: Take 1 tablet (25 mg total) by mouth daily.    Authorizing Provider: GOLLAN, TIMOTHY J    Ordering User: Ameirah Khatoon N    

## 2016-08-02 NOTE — Telephone Encounter (Signed)
Refill sent in

## 2016-08-03 ENCOUNTER — Other Ambulatory Visit: Payer: Self-pay

## 2016-08-03 MED ORDER — METOPROLOL SUCCINATE ER 25 MG PO TB24: 25.0000 mg | ORAL_TABLET | Freq: Every day | ORAL | 0 refills | Status: DC

## 2016-08-03 NOTE — Telephone Encounter (Signed)
We would recommend she hold anticoagulation 2 days prior to the procedure Would restart anticoagulation when GI allows depending on whether biopsies obtained Otherwise acceptable risk for procedure, no further testing needed

## 2016-08-03 NOTE — Telephone Encounter (Signed)
Requested Prescriptions   Signed Prescriptions Disp Refills  . metoprolol succinate (TOPROL-XL) 25 MG 24 hr tablet 30 tablet 0    Sig: Take 1 tablet (25 mg total) by mouth daily.    Authorizing Provider: Antonieta IbaGOLLAN, TIMOTHY J    Ordering User: Margrett RudSLAYTON, Tonimarie Gritz N

## 2016-08-04 NOTE — Telephone Encounter (Signed)
Cardiac clearance routed to number provided.  

## 2016-09-03 NOTE — Progress Notes (Signed)
Cardiology Office Note  Date:  09/06/2016   ID:  Jacqueline Powers, Person 1976/06/14, MRN [id number]  PCP:  Margaretann Loveless, MD   Chief Complaint  Patient presents with  . other    6 month follow up. Meds reviewed by the pt. verbally. Pt. c/o abdominal pain & indigestion.     HPI:  Jacqueline Powers is a pleasant 82 year old woman with a history of  hyperlipidemia,  Hashimoto's thyroiditis with elevated TSH, osteoporosis,  remote history of chest pain,  GERD,  previously evaluated for jaw pain, neck pain, tachycardia CT coronary calcium score (score of 17) She does have a prior history of neck  surgery, fusion  30 day monitor documented atrial fibrillation.  She presents today for follow-up of her atrial fibrillation And for follow-up of her   visit in the emergency room 03/26/16: atrial fib rate 150 bpm She was sleeping when she was woken up by severe tachycardia EKG showing atrial fibrillation.converted to NSR w/o intervention  In follow-up today she denies having any significant tachycardia or palpitation episodes concerning for atrial fibrillation. She has not had to take her diltiazem Tolerating metoprolol  Reports havingABD pain for 2 months Thinks it may be zetia, taking this every other day Did a trial off the zetia, Now back on it  No recent lab work available, last in April 2017  Started on meloxicam and proton pump inhibitor by primary care for neck and shoulder pain She stopped this on her own  PreviousCT scan imaging reviewed with her showing mild coronary calcification, mild descending aortic calcification   EKG personally reviewed by myself on todays visit shows normal sinus rhythm with rate 76 bpm, no significant ST or T-wave changes  Other past medical history reviewed  Last episode of atrial fibrillation 03/26/2016  Prior to that had episode in August 2017  lasting 5-10 minutes Typically episodes are rare, do not last very long  Has not been taking her lipitor,  feels it is affecting her thinking  30 day monitor shows atrial fibrillation on February 10 lasting for several hours, 9:30 at night until at least 1 AM on 07/10/2014   TIA February 2015.  She had left upper extremity, left lower extremity weakness that resolved in 15 minutes CT scan and MRI did not document stroke. Carotid ultrasound showed mild disease on one side, minimal on the other She was started on aspirin and discharged home. No further episodes of TIA or stroke over the past year.  Previous episodes of tachycardia, jaw pain, neck pain lasting 45 minutes up to one hour. Repeat episode 06/05/2014 with heart rate up to 140 bpm. Again some jaw pain. She reports having an episode in the past where she had jaw pain, heart rate up to 100.   PMH:   has a past medical history of Arthritis; Asthma; Carpal tunnel syndrome; Colonic polyp; Degenerative joint disease of cervical and lumbar spine; GERD (gastroesophageal reflux disease); Glaucoma; Hashimoto's thyroiditis; Hiatal hernia; History of depression; Hyperlipidemia; Irritable bowel syndrome; Osteoporosis; PAF (paroxysmal atrial fibrillation) (HCC); Perennial allergic rhinitis; Right knee meniscal tear; and TIA (transient ischemic attack).  PSH:    Past Surgical History:  Procedure Laterality Date  . BACK SURGERY     L5  . CATARACT EXTRACTION    . CERVICAL CONE BIOPSY    . CERVICAL FUSION    . COLONOSCOPY      Current Outpatient Prescriptions  Medication Sig Dispense Refill  . diltiazem (CARDIZEM) 30 MG tablet Take 1 tablet (  30 mg total) by mouth 3 (three) times daily as needed. 90 tablet 4  . ezetimibe (ZETIA) 10 MG tablet Take 1 tablet (10 mg total) by mouth daily. 90 tablet 3  . fluticasone (FLONASE) 50 MCG/ACT nasal spray instill 2 sprays into each nostril at bedtime if needed    . FLUZONE HIGH-DOSE 0.5 ML SUSY Inject as directed once.  0  . levothyroxine (SYNTHROID, LEVOTHROID) 25 MCG tablet Take 25 mcg by mouth daily  before breakfast.     . LUMIGAN 0.01 % SOLN Place 1 drop into both eyes daily.    . metoprolol succinate (TOPROL-XL) 25 MG 24 hr tablet Take 1 tablet (25 mg total) by mouth daily. 30 tablet 0  . naproxen sodium (ANAPROX) 220 MG tablet Take 220 mg by mouth as needed.    . rivaroxaban (XARELTO) 20 MG TABS tablet Take 1 tablet (20 mg total) by mouth daily with supper. 30 tablet 3  . rosuvastatin (CRESTOR) 5 MG tablet Take 1 tablet (5 mg total) by mouth every other day. 45 tablet 3   No current facility-administered medications for this visit.      Allergies:   Amoxicillin-pot clavulanate; Codeine; Other; Septra [sulfamethoxazole-trimethoprim]; Simvastatin; and Sulfa antibiotics   Social History:  The patient  reports that she has never smoked. She has never used smokeless tobacco. She reports that she drinks alcohol. She reports that she does not use drugs.   Family History:   family history includes Hypertension in her sister.    Review of Systems: Review of Systems  Constitutional: Negative.   Respiratory: Negative.   Cardiovascular: Negative.        Tachycardia  Gastrointestinal: Negative.   Musculoskeletal: Positive for back pain and neck pain.  Neurological: Negative.   Psychiatric/Behavioral: Negative.   All other systems reviewed and are negative.    PHYSICAL EXAM: VS:  BP 122/74 (BP Location: Left Arm, Patient Position: Sitting, Cuff Size: Normal)   Pulse 76   Ht  (1.6 m)   Wt 135 lb 4 oz (61.3 kg)   BMI 23.96 kg/m  , BMI Body mass index is 23.96 kg/m. GEN: Well nourished, well developed, in no acute distress  HEENT: normal  Neck: no JVD, carotid bruits, or masses Cardiac: RRR; no murmurs, rubs, or gallops,no edema  Respiratory:  clear to auscultation bilaterally, normal work of breathing GI: soft, nontender, nondistended, + BS MS: no deformity or atrophy  Skin: warm and dry, no rash Neuro:  Strength and sensation are intact Psych: euthymic mood, full  affect    Recent Labs: 03/08/2016: ALT 15 03/26/2016: BUN 19; Creatinine, Ser 0.81; Hemoglobin 15.2; Platelets 214; Potassium 3.4; Sodium 141    Lipid Panel Lab Results  Component Value Date   CHOL 235 (H) 03/08/2016   HDL 61 03/08/2016   LDLCALC 155 (H) 03/08/2016   TRIG 94 03/08/2016      Wt Readings from Last 3 Encounters:  09/06/16 135 lb 4 oz (61.3 kg)  03/30/16 133 lb 12 oz (60.7 kg)  03/26/16 132 lb (59.9 kg)       ASSESSMENT AND PLAN:   Paroxysmal atrial fibrillation (HCC) - Plan: EKG 12-Lead No recent episodes, no changes to her medications Currently on metoprolol  Other specified transient cerebral ischemias She denies having any TIA or stroke type symptoms On anticoagulation  Hyperlipidemia She is tolerating Crestor 5 Milligram every other day  zetia Every other day given mild aortic and coronary atherosclerosis  Order placed to have lab  work performed, fasting  Chest pain, unspecified chest pain type Denies any significant chest pain on today's visit   Encounter for anticoagulation discussion and counseling Tolerating Xarelto 20 mg daily Very concerned about risk of stroke from paroxysmal atrial fibrillation   Total encounter time more than 25 minutes  Greater than 50% was spent in counseling and coordination of care with the patient   Disposition:   F/U  12 months   Orders Placed This Encounter  Procedures  . EKG 12-Lead     Signed, Dossie Arbour, M.D., Ph.D. 09/06/2016  [facility] [place], [place] [phone]

## 2016-09-06 ENCOUNTER — Encounter: Payer: Self-pay | Admitting: Cardiovascular Disease

## 2016-09-06 ENCOUNTER — Ambulatory Visit (INDEPENDENT_AMBULATORY_CARE_PROVIDER_SITE_OTHER): Payer: Medicare Other | Admitting: Cardiovascular Disease

## 2016-09-06 VITALS — BP 122/74 | HR 76 | Ht 63.0 in | Wt 135.2 lb

## 2016-09-06 DIAGNOSIS — R0789 Other chest pain: Secondary | ICD-10-CM

## 2016-09-06 DIAGNOSIS — I480 Paroxysmal atrial fibrillation: Secondary | ICD-10-CM | POA: Diagnosis not present

## 2016-09-06 DIAGNOSIS — E782 Mixed hyperlipidemia: Secondary | ICD-10-CM | POA: Diagnosis not present

## 2016-09-06 DIAGNOSIS — Z7189 Other specified counseling: Secondary | ICD-10-CM | POA: Diagnosis not present

## 2016-09-06 MED ORDER — EZETIMIBE 10 MG PO TABS: 10.0000 mg | ORAL_TABLET | Freq: Every day | ORAL | 3 refills | Status: DC

## 2016-09-06 MED ORDER — ROSUVASTATIN CALCIUM 5 MG PO TABS: 5.0000 mg | ORAL_TABLET | Freq: Every day | ORAL | 3 refills | Status: DC

## 2016-09-06 MED ORDER — ROSUVASTATIN CALCIUM 5 MG PO TABS: 5.0000 mg | ORAL_TABLET | ORAL | 3 refills | Status: DC

## 2016-09-06 NOTE — Patient Instructions (Addendum)
Medication Instructions:   No medication changes made  Ask Dr Welton Flakes about Tramadol  Labwork:  Labs here fasting another day CBC, BMP, lipids and LFTS  Testing/Procedures:  No further testing at this time   I recommend watching educational videos on topics of interest to you at:       [email or web address].[email or web address]  Enter code: [email or web address]    Follow-Up: It was a pleasure seeing you in the office today. Please call us if you have new issues that need to be addressed before your next appt.  [phone]  Your physician wants you to follow-up in: 12 months.  You will receive a reminder letter in the mail two months in advance. If you don't receive a letter, please call our office to schedule the follow-up appointment.  If you need a refill on your cardiac medications before your next appointment, please call your pharmacy.

## 2016-09-08 ENCOUNTER — Other Ambulatory Visit (INDEPENDENT_AMBULATORY_CARE_PROVIDER_SITE_OTHER): Payer: Medicare Other

## 2016-09-08 DIAGNOSIS — I480 Paroxysmal atrial fibrillation: Secondary | ICD-10-CM

## 2016-09-08 DIAGNOSIS — R0789 Other chest pain: Secondary | ICD-10-CM

## 2016-09-08 DIAGNOSIS — Z7189 Other specified counseling: Secondary | ICD-10-CM

## 2016-09-08 DIAGNOSIS — E782 Mixed hyperlipidemia: Secondary | ICD-10-CM

## 2016-09-09 LAB — HEPATIC FUNCTION PANEL
ALT: 20 IU/L (ref 0–32)
AST: 21 IU/L (ref 0–40)
Albumin: 4.3 g/dL (ref 3.5–4.8)
Alkaline Phosphatase: 84 IU/L (ref 39–117)
BILIRUBIN, DIRECT: 0.12 mg/dL (ref 0.00–0.40)
Bilirubin Total: 0.3 mg/dL (ref 0.0–1.2)
TOTAL PROTEIN: 6.1 g/dL (ref 6.0–8.5)

## 2016-09-09 LAB — BASIC METABOLIC PANEL
BUN / CREAT RATIO: 20 (ref 12–28)
BUN: 15 mg/dL (ref 8–27)
CHLORIDE: 103 mmol/L (ref 96–106)
CO2: 26 mmol/L (ref 18–29)
Calcium: 9.1 mg/dL (ref 8.7–10.3)
Creatinine, Ser: 0.74 mg/dL (ref 0.57–1.00)
GFR calc Af Amer: 89 mL/min/{1.73_m2} (ref >59)
GFR calc non Af Amer: 77 mL/min/{1.73_m2} (ref >59)
GLUCOSE: 99 mg/dL (ref 65–99)
Potassium: 4.3 mmol/L (ref 3.5–5.2)
SODIUM: 144 mmol/L (ref 134–144)

## 2016-09-09 LAB — LIPID PANEL
CHOLESTEROL TOTAL: 140 mg/dL (ref 100–199)
Chol/HDL Ratio: 2.3 ratio (ref 0.0–4.4)
HDL: 62 mg/dL (ref >39)
LDL CALC: 64 mg/dL (ref 0–99)
Triglycerides: 71 mg/dL (ref 0–149)
VLDL Cholesterol Cal: 14 mg/dL (ref 5–40)

## 2016-09-09 LAB — CBC WITH DIFFERENTIAL/PLATELET
BASOS ABS: 0 10*3/uL (ref 0.0–0.2)
Basos: 1 %
EOS (ABSOLUTE): 0.2 10*3/uL (ref 0.0–0.4)
Eos: 4 %
Hematocrit: 42.2 % (ref 34.0–46.6)
Hemoglobin: 14.3 g/dL (ref 11.1–15.9)
IMMATURE GRANS (ABS): 0 10*3/uL (ref 0.0–0.1)
Immature Granulocytes: 0 %
LYMPHS: 32 %
Lymphocytes Absolute: 1.8 10*3/uL (ref 0.7–3.1)
MCH: 31.6 pg (ref 26.6–33.0)
MCHC: 33.9 g/dL (ref 31.5–35.7)
MCV: 93 fL (ref 79–97)
Monocytes Absolute: 0.7 10*3/uL (ref 0.1–0.9)
Monocytes: 12 %
NEUTROS ABS: 2.8 10*3/uL (ref 1.4–7.0)
Neutrophils: 51 %
PLATELETS: 210 10*3/uL (ref 150–379)
RBC: 4.53 x10E6/uL (ref 3.77–5.28)
RDW: 13.6 % (ref 12.3–15.4)
WBC: 5.5 10*3/uL (ref 3.4–10.8)

## 2016-09-21 ENCOUNTER — Other Ambulatory Visit: Payer: Self-pay | Admitting: Cardiovascular Disease

## 2016-10-06 ENCOUNTER — Ambulatory Visit: Payer: Self-pay | Admitting: Certified Nurse Midwife

## 2016-10-06 ENCOUNTER — Ambulatory Visit (INDEPENDENT_AMBULATORY_CARE_PROVIDER_SITE_OTHER): Payer: Medicare Other | Admitting: Obstetrics and Gynecology

## 2016-10-06 ENCOUNTER — Encounter: Payer: Self-pay | Admitting: Obstetrics and Gynecology

## 2016-10-06 VITALS — BP 140/87 | HR 68 | Ht 63.0 in | Wt 135.4 lb

## 2016-10-06 DIAGNOSIS — Z0000 Encounter for general adult medical examination without abnormal findings: Secondary | ICD-10-CM | POA: Diagnosis not present

## 2016-10-06 NOTE — Progress Notes (Signed)
HPI:      [name] is a 82 y.o. [phone] [id number] who LMP was No LMP recorded. Patient is postmenopausal.  Subjective:   She presents today for her annual examination.  She has no GYN complaints. She simply would like an exam and Pap smear. She describes a remote history of conization of her cervix but multiple normal Paps since that time. She says that she is up-to-date on mammograms and gets them yearly. She is scheduled for a colonoscopy this year. She states she has osteoporosis but cannot take medicine because of the stomach and esophageal issues. She reports taking hormone replacement therapy for the first 12 years of menopause but then stopped after that.    Hx: The following portions of the patient's history were reviewed and updated as appropriate:              She  has a past medical history of Arthritis; Asthma; Carpal tunnel syndrome; Colonic polyp; Degenerative joint disease of cervical and lumbar spine; GERD (gastroesophageal reflux disease); Glaucoma; Hashimoto's thyroiditis; Hiatal hernia; History of depression; Hyperlipidemia; Irritable bowel syndrome; Osteoporosis; PAF (paroxysmal atrial fibrillation) (HCC); Perennial allergic rhinitis; Right knee meniscal tear; and TIA (transient ischemic attack). She  does not have any pertinent problems on file. She  has a past surgical history that includes Cervical fusion; Back surgery; Colonoscopy; Cervical cone biopsy; and Cataract extraction. Her family history includes Hypertension in her sister. She  reports that she has never smoked. She has never used smokeless tobacco. She reports that she drinks alcohol. She reports that she does not use drugs.        Review of Systems:  Review of Systems  Constitutional: Denied constitutional symptoms, night sweats, recent illness, fatigue, fever, insomnia and weight loss.  Eyes: Denied eye symptoms, eye pain, photophobia, vision change and visual disturbance.  Ears/Nose/Throat/Neck: Denied  ear, nose, throat or neck symptoms, hearing loss, nasal discharge, sinus congestion and sore throat.  Cardiovascular: Denied cardiovascular symptoms, arrhythmia, chest pain/pressure, edema, exercise intolerance, orthopnea and palpitations.  Respiratory: Denied pulmonary symptoms, asthma, pleuritic pain, productive sputum, cough, dyspnea and wheezing.  Gastrointestinal: Denied, gastro-esophageal reflux, melena, nausea and vomiting.  Genitourinary: Denied genitourinary symptoms including symptomatic vaginal discharge, pelvic relaxation issues, and urinary complaints.  Musculoskeletal: Denied musculoskeletal symptoms, stiffness, swelling, muscle weakness and myalgia.  Dermatologic: Denied dermatology symptoms, rash and scar.  Neurologic: Denied neurology symptoms, dizziness, headache, neck pain and syncope.  Psychiatric: Denied psychiatric symptoms, anxiety and depression.  Endocrine: Denied endocrine symptoms including hot flashes and night sweats.   Meds:   Current Outpatient Prescriptions on File Prior to Visit  Medication Sig Dispense Refill  . diltiazem (CARDIZEM) 30 MG tablet Take 1 tablet (30 mg total) by mouth 3 (three) times daily as needed. 90 tablet 4  . ezetimibe (ZETIA) 10 MG tablet Take 1 tablet (10 mg total) by mouth daily. 90 tablet 3  . fluticasone (FLONASE) 50 MCG/ACT nasal spray instill 2 sprays into each nostril at bedtime if needed    . levothyroxine (SYNTHROID, LEVOTHROID) 25 MCG tablet Take 25 mcg by mouth daily before breakfast.     . LUMIGAN 0.01 % SOLN Place 1 drop into both eyes daily.    . metoprolol succinate (TOPROL-XL) 25 MG 24 hr tablet Take 1 tablet (25 mg total) by mouth daily. 30 tablet 0  . rosuvastatin (CRESTOR) 5 MG tablet Take 1 tablet (5 mg total) by mouth daily at 6 PM. 90 tablet 3  . XARELTO 20 MG  TABS tablet TAKE 1 TABLET DAILY WITH SUPPER 90 tablet 3   No current facility-administered medications on file prior to visit.     Objective:     Vitals:    10/06/16 1053  BP: 140/87  Pulse: 68              Physical examination General NAD, Conversant  HEENT Atraumatic; Op clear with mmm.  Normo-cephalic. Pupils reactive. Anicteric sclerae  Thyroid/Neck Smooth without nodularity or enlargement. Normal ROM.  Neck Supple.  Skin No rashes, lesions or ulceration. Normal palpated skin turgor. No nodularity.  Breasts: No masses or discharge.  Symmetric.  No axillary adenopathy.  Lungs: Clear to auscultation.No rales or wheezes. Normal Respiratory effort, no retractions.  Heart: NSR.  No murmurs or rubs appreciated. No periferal edema  Abdomen: Soft.  Non-tender.  No masses.  No HSM. No hernia  Extremities: Moves all appropriately.  Normal ROM for age. No lymphadenopathy.  Neuro: Oriented to PPT.  Normal mood. Normal affect.     Pelvic:   Vulva: Normal appearance.  No lesions.  Vagina: No lesions or abnormalities noted.  Atrophic   Support: Normal pelvic support.  Urethra No masses tenderness or scarring.  Meatus Normal size without lesions or prolapse.  Cervix: Normal appearance.  No lesions.  Obviously status post procedure   Anus: Normal exam.  No lesions.  Perineum: Normal exam.  No lesions.        Bimanual   Uterus: Normal size.  Non-tender.  Mobile.  Mid position   Adnexae: No masses.  Non-tender to palpation.  Cul-de-sac: Negative for abnormality.      Assessment:    [id number] Patient Active Problem List   Diagnosis Date Noted  . PAF (paroxysmal atrial fibrillation) (HCC)   . Encounter for anticoagulation discussion and counseling 11/10/2014  . Atrial fibrillation (HCC) 08/04/2014  . Tachycardia 06/23/2014  . TIA (transient ischemic attack) 06/23/2014  . Hashimoto's thyroiditis 06/23/2014  . Jaw pain, non-TMJ 06/23/2014  . Neck pain 06/23/2014  . Chest pain 08/17/2012  . Hyperlipidemia 08/17/2012     1. Encounter for annual physical exam     Normal GYN exam 2.  Patient up-to-date on basic screening recommendations.   Taking medications as directed    Plan:            1.  Basic Screening Recommendations The basic screening recommendations for asymptomatic women were discussed with the patient during her visit.  The age-appropriate recommendations were discussed with her and the rational for the tests reviewed.  When I am informed by the patient that another primary care physician has previously obtained the age-appropriate tests and they are up-to-date, only outstanding tests are ordered and referrals given as necessary.  Abnormal results of tests will be discussed with her when all of her results are completed. Pap performed.  Patient gets the remainder of her testing through her family physician.  2.  We have discussed the use of calcium and vitamin D for osteoporosis. 3.  I have recommended one further Pap smear in 3 years and then consideration for discontinuation of screening.       F/U  Return in about 1 year (around 10/06/2017) for Annual Physical.  Pap in 3 years  Elonda Husky, M.D. 10/06/2016 11:43 AM

## 2016-10-06 NOTE — Addendum Note (Signed)
Addended by: Brooke DareSICK, Altovise Wahler L on: 10/06/2016 11:51 AM   Modules accepted: Orders

## 2016-10-09 LAB — PAP IG AND HPV HIGH-RISK
HPV, high-risk: NEGATIVE
PAP Smear Comment: 0

## 2016-10-10 ENCOUNTER — Telehealth: Payer: Self-pay

## 2016-10-10 NOTE — Telephone Encounter (Signed)
-----   Message from Linzie Collinavid James Evans, MD sent at 10/10/2016 12:03 PM EDT ----- Negative Pap and HPV

## 2016-10-10 NOTE — Telephone Encounter (Signed)
Mychart message sent.

## 2016-10-26 ENCOUNTER — Emergency Department
Admission: EM | Admit: 2016-10-26 | Discharge: 2016-10-26 | Disposition: A | Discharge status: Home / Self Care | Payer: Medicare Other | Attending: Student in an Organized Health Care Education/Training Program | Admitting: Student in an Organized Health Care Education/Training Program

## 2016-10-26 ENCOUNTER — Emergency Department: Payer: Medicare Other

## 2016-10-26 ENCOUNTER — Encounter: Payer: Self-pay | Admitting: *Deleted

## 2016-10-26 DIAGNOSIS — Y990 Civilian activity done for income or pay: Secondary | ICD-10-CM | POA: Insufficient documentation

## 2016-10-26 DIAGNOSIS — S025XXA Fracture of tooth (traumatic), initial encounter for closed fracture: Secondary | ICD-10-CM

## 2016-10-26 DIAGNOSIS — S42221A 2-part displaced fracture of surgical neck of right humerus, initial encounter for closed fracture: Secondary | ICD-10-CM

## 2016-10-26 DIAGNOSIS — S4991XA Unspecified injury of right shoulder and upper arm, initial encounter: Secondary | ICD-10-CM | POA: Insufficient documentation

## 2016-10-26 DIAGNOSIS — S0990XA Unspecified injury of head, initial encounter: Secondary | ICD-10-CM | POA: Insufficient documentation

## 2016-10-26 DIAGNOSIS — S0003XA Contusion of scalp, initial encounter: Secondary | ICD-10-CM | POA: Diagnosis not present

## 2016-10-26 DIAGNOSIS — I10 Essential (primary) hypertension: Secondary | ICD-10-CM | POA: Insufficient documentation

## 2016-10-26 DIAGNOSIS — Y9301 Activity, walking, marching and hiking: Secondary | ICD-10-CM | POA: Diagnosis not present

## 2016-10-26 DIAGNOSIS — Z8673 Personal history of transient ischemic attack (TIA), and cerebral infarction without residual deficits: Secondary | ICD-10-CM | POA: Insufficient documentation

## 2016-10-26 DIAGNOSIS — Z7901 Long term (current) use of anticoagulants: Secondary | ICD-10-CM | POA: Insufficient documentation

## 2016-10-26 DIAGNOSIS — W0110XA Fall on same level from slipping, tripping and stumbling with subsequent striking against unspecified object, initial encounter: Secondary | ICD-10-CM | POA: Insufficient documentation

## 2016-10-26 DIAGNOSIS — Z79899 Other long term (current) drug therapy: Secondary | ICD-10-CM | POA: Insufficient documentation

## 2016-10-26 DIAGNOSIS — Y92099 Unspecified place in other non-institutional residence as the place of occurrence of the external cause: Secondary | ICD-10-CM | POA: Insufficient documentation

## 2016-10-26 DIAGNOSIS — J45909 Unspecified asthma, uncomplicated: Secondary | ICD-10-CM | POA: Insufficient documentation

## 2016-10-26 DIAGNOSIS — W19XXXA Unspecified fall, initial encounter: Secondary | ICD-10-CM

## 2016-10-26 HISTORY — DX: Essential (primary) hypertension: I10

## 2016-10-26 LAB — CBC WITH DIFFERENTIAL/PLATELET
BASOS PCT: 1 %
Basophils Absolute: 0 10*3/uL (ref 0–0.1)
Eosinophils Absolute: 0.3 10*3/uL (ref 0–0.7)
Eosinophils Relative: 4 %
HEMATOCRIT: 41.8 % (ref 35.0–47.0)
Hemoglobin: 14.2 g/dL (ref 12.0–16.0)
Lymphocytes Relative: 21 %
Lymphs Abs: 1.5 10*3/uL (ref 1.0–3.6)
MCH: 31.3 pg (ref 26.0–34.0)
MCHC: 33.9 g/dL (ref 32.0–36.0)
MCV: 92.3 fL (ref 80.0–100.0)
MONO ABS: 0.7 10*3/uL (ref 0.2–0.9)
Monocytes Relative: 10 %
NEUTROS ABS: 4.8 10*3/uL (ref 1.4–6.5)
Neutrophils Relative %: 64 %
Platelets: 213 10*3/uL (ref 150–440)
RBC: 4.53 MIL/uL (ref 3.80–5.20)
RDW: 13.3 % (ref 11.5–14.5)
WBC: 7.4 10*3/uL (ref 3.6–11.0)

## 2016-10-26 LAB — BASIC METABOLIC PANEL
Anion gap: 9 (ref 5–15)
BUN: 18 mg/dL (ref 6–20)
CALCIUM: 9.1 mg/dL (ref 8.9–10.3)
CO2: 28 mmol/L (ref 22–32)
CREATININE: 0.81 mg/dL (ref 0.44–1.00)
Chloride: 100 mmol/L — ABNORMAL LOW (ref 101–111)
GFR calc non Af Amer: >60 mL/min (ref >60)
GLUCOSE: 107 mg/dL — AB (ref 65–99)
Potassium: 3.8 mmol/L (ref 3.5–5.1)
Sodium: 137 mmol/L (ref 135–145)

## 2016-10-26 MED ORDER — OXYCODONE HCL 5 MG PO TABS
5.0000 mg | ORAL_TABLET | ORAL | Status: DC | PRN
  Administered 2016-10-26: 5 mg via ORAL
  Filled 2016-10-26: qty 1

## 2016-10-26 MED ORDER — ACETAMINOPHEN 500 MG PO TABS
1000.0000 mg | ORAL_TABLET | Freq: Once | ORAL | Status: DC
  Filled 2016-10-26: qty 2

## 2016-10-26 MED ORDER — HYDROCODONE-ACETAMINOPHEN 5-325 MG PO TABS: 1.0000 | ORAL_TABLET | ORAL | 0 refills | Status: DC | PRN

## 2016-10-26 MED ORDER — ONDANSETRON HCL 4 MG/2ML IJ SOLN
4.0000 mg | Freq: Once | INTRAMUSCULAR | Status: DC
  Filled 2016-10-26: qty 2

## 2016-10-26 MED ORDER — CYCLOBENZAPRINE HCL 5 MG PO TABS: 5.0000 mg | ORAL_TABLET | Freq: Every day | ORAL | 0 refills | Status: DC

## 2016-10-26 MED ORDER — FENTANYL CITRATE (PF) 100 MCG/2ML IJ SOLN
50.0000 ug | INTRAMUSCULAR | Status: DC | PRN
  Filled 2016-10-26: qty 2

## 2016-10-26 MED ORDER — LIDOCAINE 5 % EX PTCH: 1.0000 | MEDICATED_PATCH | Freq: Two times a day (BID) | CUTANEOUS | 0 refills | Status: DC

## 2016-10-26 NOTE — ED Notes (Signed)
PT stood for 10 minutes through sling application and removal of IV. PT became pale and diaphoretic and reported feeling weak. Pt sat on bed and is currently eating saline crackers. Pt resting and will reevaluate for discharge.

## 2016-10-26 NOTE — ED Notes (Signed)
Pt able to stand and reports no longer being dizzy. Pt able to sit in wheelchair and is helped to car by RN.

## 2016-10-26 NOTE — ED Provider Notes (Signed)
Dentistry.  [facility] Emergency Department Provider Note    First MD Initiated Contact with Patient 10/26/16 1609     (approximate)  I have reviewed the triage vital signs and the nursing notes.   HISTORY  Chief Complaint Fall    HPI Jacqueline Powers is a 82 y.o. female presents with chief complaint of head and neck pain with laceration to her lip and pain to her right arm after mechanical fall. Patient was walking into the bathroom and it was still wet. She slipped and fell. Did hit the top of her head. Does take a Xarelto for history of A. fib. Is complaining of right arm pain with difficulty raising it. Denies any hip pain or leg pain. Denies any chest pain or shortness of breath.   Past Medical History:  Diagnosis Date  . Arthritis   . Asthma   . Carpal tunnel syndrome   . Colonic polyp   . Degenerative joint disease of cervical and lumbar spine   . GERD (gastroesophageal reflux disease)   . Glaucoma   . Hashimoto's thyroiditis    a. 05/2002 s/p resection of thyroid nodule-->on replacement.  . Hiatal hernia   . History of depression   . Hyperlipidemia   . Hypertension   . Irritable bowel syndrome   . Osteoporosis   . PAF (paroxysmal atrial fibrillation) (HCC)    a. CHA2DS2VASc = 5-->xarelto;    . Perennial allergic rhinitis   . Right knee meniscal tear   . TIA (transient ischemic attack)    a. 06/2013: LUE/LLE wkns x 15 mins, MRI neg for CVA.   Family History  Problem Relation Age of Onset  . Hypertension Sister    Past Surgical History:  Procedure Laterality Date  . BACK SURGERY     L5  . CATARACT EXTRACTION    . CERVICAL CONE BIOPSY    . CERVICAL FUSION    . COLONOSCOPY     Patient Active Problem List   Diagnosis Date Noted  . PAF (paroxysmal atrial fibrillation) (HCC)   . Encounter for anticoagulation discussion and counseling 11/10/2014  . Atrial fibrillation (HCC) 08/04/2014  . Tachycardia 06/23/2014  . TIA (transient  ischemic attack) 06/23/2014  . Hashimoto's thyroiditis 06/23/2014  . Jaw pain, non-TMJ 06/23/2014  . Neck pain 06/23/2014  . Chest pain 08/17/2012  . Hyperlipidemia 08/17/2012      Prior to Admission medications   Medication Sig Start Date End Date Taking? Authorizing Provider  cyclobenzaprine (FLEXERIL) 5 MG tablet Take 1 tablet (5 mg total) by mouth at bedtime. 10/26/16   Willy Eddy, MD  diltiazem (CARDIZEM) 30 MG tablet Take 1 tablet (30 mg total) by mouth 3 (three) times daily as needed. 03/30/16   Antonieta Iba, MD  ezetimibe (ZETIA) 10 MG tablet Take 1 tablet (10 mg total) by mouth daily. 09/06/16 10/06/16  Antonieta Iba, MD  fluticasone (FLONASE) 50 MCG/ACT nasal spray instill 2 sprays into each nostril at bedtime if needed 02/17/14   [provider]  HYDROcodone-acetaminophen (NORCO) 5-325 MG tablet Take 1 tablet by mouth every 4 (four) hours as needed for moderate pain. 10/26/16   Willy Eddy, MD  levothyroxine (SYNTHROID, LEVOTHROID) 25 MCG tablet Take 25 mcg by mouth daily before breakfast.  06/12/14   [provider]  lidocaine (LIDODERM) 5 % Place 1 patch onto the skin every 12 (twelve) hours. Remove & Discard patch within 12 hours or as directed by MD 10/26/16 10/26/17  Willy Eddy,  MD  LUMIGAN 0.01 % SOLN Place 1 drop into both eyes daily. 05/15/15   [provider]  metoprolol succinate (TOPROL-XL) 25 MG 24 hr tablet Take 1 tablet (25 mg total) by mouth daily. 08/03/16   Antonieta IbaGollan, Timothy J, MD  rosuvastatin (CRESTOR) 5 MG tablet Take 1 tablet (5 mg total) by mouth daily at 6 PM. 09/06/16 12/05/16  Antonieta IbaGollan, Timothy J, MD  XARELTO 20 MG TABS tablet TAKE 1 TABLET DAILY WITH SUPPER 09/21/16   Antonieta IbaGollan, Timothy J, MD    Allergies Amoxicillin-pot clavulanate; Codeine; Other; Septra [sulfamethoxazole-trimethoprim]; Simvastatin; and Sulfa antibiotics    Social History Social History  Substance Use Topics  . Smoking status: Never Smoker  .  Smokeless tobacco: Never Used  . Alcohol use Yes     Comment: occasional    Review of Systems Patient denies headaches, rhinorrhea, blurry vision, numbness, shortness of breath, chest pain, edema, cough, abdominal pain, nausea, vomiting, diarrhea, dysuria, fevers, rashes or hallucinations unless otherwise stated above in HPI. ____________________________________________   PHYSICAL EXAM:  VITAL SIGNS: Vitals:   10/26/16 1610 10/26/16 1616  BP: (!) 142/128 (!) 173/90  Pulse: 77   Resp: 18   Temp: 97.3 F (36.3 C)     Constitutional: Alert and oriented. Dried blood on chin Eyes: Conjunctivae are normal.  Head: swelling and contusion to top of head Nose: No congestion/rhinnorhea. Mouth/Throat: Mucous membranes are moist.  Rennis HardingEllis two fracture to 8th tooth.  Small superficial laceration of inferior lip not involving vermilion border   Neck: No stridor. Painless ROM.  Cardiovascular: Normal rate, regular rhythm. Grossly normal heart sounds.  Good peripheral circulation. Respiratory: Normal respiratory effort.  No retractions. Lungs CTAB. Gastrointestinal: Soft and nontender. No distention. No abdominal bruits. No CVA tenderness. Musculoskeletal: No lower extremity tenderness nor edema.  No joint effusions. Neurologic:  Normal speech and language. No gross focal neurologic deficits are appreciated. No facial droop Skin:  Skin is warm, dry and intact. No rash noted. Psychiatric: Mood and affect are normal. Speech and behavior are normal.  ____________________________________________   LABS (all labs ordered are listed, but only abnormal results are displayed)  Results for orders placed or performed during the hospital encounter of 10/26/16 (from the past 24 hour(s))  CBC with Differential/Platelet     Status: None   Collection Time: 10/26/16  5:32 PM  Result Value Ref Range   WBC 7.4 3.6 - 11.0 K/uL   RBC 4.53 3.80 - 5.20 MIL/uL   Hemoglobin 14.2 12.0 - 16.0 g/dL   HCT 69.641.8 29.535.0  - 28.447.0 %   MCV 92.3 80.0 - 100.0 fL   MCH 31.3 26.0 - 34.0 pg   MCHC 33.9 32.0 - 36.0 g/dL   RDW 13.213.3 44.011.5 - 10.214.5 %   Platelets 213 150 - 440 K/uL   Neutrophils Relative % 64 %   Neutro Abs 4.8 1.4 - 6.5 K/uL   Lymphocytes Relative 21 %   Lymphs Abs 1.5 1.0 - 3.6 K/uL   Monocytes Relative 10 %   Monocytes Absolute 0.7 0.2 - 0.9 K/uL   Eosinophils Relative 4 %   Eosinophils Absolute 0.3 0 - 0.7 K/uL   Basophils Relative 1 %   Basophils Absolute 0.0 0 - 0.1 K/uL  Basic metabolic panel     Status: Abnormal   Collection Time: 10/26/16  5:32 PM  Result Value Ref Range   Sodium 137 135 - 145 mmol/L   Potassium 3.8 3.5 - 5.1 mmol/L   Chloride 100 (L)  101 - 111 mmol/L   CO2 28 22 - 32 mmol/L   Glucose, Bld 107 (H) 65 - 99 mg/dL   BUN 18 6 - 20 mg/dL   Creatinine, Ser 4.78 0.44 - 1.00 mg/dL   Calcium 9.1 8.9 - 29.5 mg/dL   GFR calc non Af Amer >60 >60 mL/min   GFR calc Af Amer >60 >60 mL/min   Anion gap 9 5 - 15   ____________________________________________  EKG___________________________________________  RADIOLOGY  I personally reviewed all radiographic images ordered to evaluate for the above acute complaints and reviewed radiology reports and findings.  These findings were personally discussed with the patient.  Please see medical record for radiology report.  ____________________________________________   PROCEDURES  Procedure(s) performed:  Procedures yes  Dental Applied to 8 tooth secondary to [facility] 2 fracture. This is Using 10 max. Patient tolerated procedure well.   Critical Care performed: no ____________________________________________   INITIAL IMPRESSION / ASSESSMENT AND PLAN / ED COURSE  Pertinent labs & imaging results that were available during my care of the patient were reviewed by me and considered in my medical decision making (see chart for details).  DDX: sah, sdh, edh, fracture, contusion, soft tissue injury, viscous injury, concussion,  hemorrhage   Jacqueline Powers is a 82 y.o. who presents to the ED with mechanical fall as described above with evidence of head injury and right shoulder pain with evidence of fracture to the eighth tooth with Rennis Harding 2 injury pattern. CT imaging ordered to evaluate for acute injury as she is on Xarelto. CT imaging shows no evidence of acute dramatic injury. X-ray of the right upper extremity and chest shows evidence of a minimally displaced fracture of the right surgical neck humerus. No associated nerve damage at this time. Patient placed in sling and will be arranged for follow-up with orthopedics      ____________________________________________   FINAL CLINICAL IMPRESSION(S) / ED DIAGNOSES  Final diagnoses:  Fall, initial encounter  Minor head injury, initial encounter  Closed 2-part displaced fracture of surgical neck of right humerus, initial encounter  Closed broken tooth due to trauma without complication, initial encounter      NEW MEDICATIONS STARTED DURING THIS VISIT:  New Prescriptions   CYCLOBENZAPRINE (FLEXERIL) 5 MG TABLET    Take 1 tablet (5 mg total) by mouth at bedtime.   HYDROCODONE-ACETAMINOPHEN (NORCO) 5-325 MG TABLET    Take 1 tablet by mouth every 4 (four) hours as needed for moderate pain.   LIDOCAINE (LIDODERM) 5 %    Place 1 patch onto the skin every 12 (twelve) hours. Remove & Discard patch within 12 hours or as directed by MD     Note:  This document was prepared using Dragon voice recognition software and may include unintentional dictation errors.    Willy Eddy, MD 10/26/16 [place]

## 2016-10-26 NOTE — ED Notes (Signed)
RN attempted to contact Jacqueline Powers at [phone] without success. No profile in cone system for company [employer]eline [employer] Correct forms filled out and pt educated on following up for testing if need be and continuing attempts to contact employer.

## 2016-10-26 NOTE — ED Notes (Signed)
Pt left floor at 2036 in NAD.

## 2016-10-26 NOTE — ED Notes (Signed)
Patient transported to CT 

## 2016-10-26 NOTE — ED Triage Notes (Signed)
Pt arrived to ED from work after having fallen on wet floor. No LOC reported. Pt presents to ED with injury and pain to right arm and multiple teeth broken in front upper jaw. Pt has blood on chin but bleeding is under control at this time.  Hx of Cervical fusion 14 years ago, Afib, HTN

## 2016-10-28 DIAGNOSIS — S42209A Unspecified fracture of upper end of unspecified humerus, initial encounter for closed fracture: Secondary | ICD-10-CM | POA: Insufficient documentation

## 2016-11-11 ENCOUNTER — Ambulatory Visit: Payer: Self-pay | Admitting: Certified Nurse Midwife

## 2016-11-21 ENCOUNTER — Other Ambulatory Visit: Payer: Self-pay | Admitting: Student

## 2016-11-21 ENCOUNTER — Other Ambulatory Visit (HOSPITAL_COMMUNITY): Payer: Self-pay | Admitting: Student

## 2016-11-21 DIAGNOSIS — M542 Cervicalgia: Secondary | ICD-10-CM

## 2016-11-23 ENCOUNTER — Ambulatory Visit
Admission: RE | Admit: 2016-11-23 | Discharge: 2016-11-23 | Disposition: A | Discharge status: Home / Self Care | Payer: Medicare Other | Source: Ambulatory Visit | Attending: Student | Admitting: Student

## 2016-11-23 DIAGNOSIS — G9589 Other specified diseases of spinal cord: Secondary | ICD-10-CM | POA: Diagnosis not present

## 2016-11-23 DIAGNOSIS — M542 Cervicalgia: Secondary | ICD-10-CM | POA: Diagnosis present

## 2016-11-23 DIAGNOSIS — M1288 Other specific arthropathies, not elsewhere classified, other specified site: Secondary | ICD-10-CM | POA: Diagnosis not present

## 2016-11-23 MED ORDER — GADOBENATE DIMEGLUMINE 529 MG/ML IV SOLN
10.0000 mL | Freq: Once | INTRAVENOUS | Status: AC | PRN
  Administered 2016-11-23: 10 mL via INTRAVENOUS

## 2016-12-14 ENCOUNTER — Ambulatory Visit
Admission: RE | Admit: 2016-12-14 | Payer: Medicare Other | Source: Ambulatory Visit | Admitting: Unknown Physician Specialty

## 2016-12-14 ENCOUNTER — Encounter: Admission: RE | Payer: Self-pay | Source: Ambulatory Visit

## 2016-12-14 SURGERY — COLONOSCOPY WITH PROPOFOL
Anesthesia: General

## 2016-12-29 ENCOUNTER — Telehealth: Payer: Self-pay | Admitting: Cardiovascular Disease

## 2016-12-29 NOTE — Telephone Encounter (Signed)
Received cardiac clearance request for pt to proceed w/ colonoscopy/EGD on 01/27/17 @ [facility] w/ propofol anesthesia.  Pt is on a wait list to try and move date of procedure to sooner date in the event of a cancellation.  Harmon DunMichelle Johnson, PA-C would like clearance to hold Xarelto x 3 days prior to procedure.  Please route to [facility] @ [phone].

## 2017-01-02 NOTE — Telephone Encounter (Signed)
Acceptable risk for procedure OK to hold anticoagulation, restart when GI reports it is safe 1 to 2 days depending on Bx

## 2017-01-03 NOTE — Telephone Encounter (Signed)
Clearance reviewed. Thanks!

## 2017-01-03 NOTE — Telephone Encounter (Signed)
Clearance routed to number provided.  

## 2017-01-06 ENCOUNTER — Encounter: Payer: Self-pay | Admitting: Emergency Medicine

## 2017-01-06 ENCOUNTER — Emergency Department
Admission: EM | Admit: 2017-01-06 | Discharge: 2017-01-06 | Disposition: A | Discharge status: Home / Self Care | Payer: Medicare Other | Attending: Emergency Medicine | Admitting: Emergency Medicine

## 2017-01-06 DIAGNOSIS — R5383 Other fatigue: Secondary | ICD-10-CM | POA: Insufficient documentation

## 2017-01-06 DIAGNOSIS — R630 Anorexia: Secondary | ICD-10-CM | POA: Diagnosis not present

## 2017-01-06 DIAGNOSIS — R140 Abdominal distension (gaseous): Secondary | ICD-10-CM | POA: Insufficient documentation

## 2017-01-06 DIAGNOSIS — Z5321 Procedure and treatment not carried out due to patient leaving prior to being seen by health care provider: Secondary | ICD-10-CM | POA: Insufficient documentation

## 2017-01-06 DIAGNOSIS — R109 Unspecified abdominal pain: Secondary | ICD-10-CM | POA: Insufficient documentation

## 2017-01-06 LAB — CBC
HCT: 41.3 % (ref 35.0–47.0)
Hemoglobin: 14 g/dL (ref 12.0–16.0)
MCH: 31.2 pg (ref 26.0–34.0)
MCHC: 34.1 g/dL (ref 32.0–36.0)
MCV: 91.8 fL (ref 80.0–100.0)
PLATELETS: 238 10*3/uL (ref 150–440)
RBC: 4.5 MIL/uL (ref 3.80–5.20)
RDW: 13.5 % (ref 11.5–14.5)
WBC: 6.7 10*3/uL (ref 3.6–11.0)

## 2017-01-06 LAB — COMPREHENSIVE METABOLIC PANEL
ALBUMIN: 4.4 g/dL (ref 3.5–5.0)
ALK PHOS: 78 U/L (ref 38–126)
ALT: 16 U/L (ref 14–54)
ANION GAP: 8 (ref 5–15)
AST: 22 U/L (ref 15–41)
BILIRUBIN TOTAL: 0.5 mg/dL (ref 0.3–1.2)
BUN: 13 mg/dL (ref 6–20)
CALCIUM: 9.3 mg/dL (ref 8.9–10.3)
CO2: 28 mmol/L (ref 22–32)
CREATININE: 0.83 mg/dL (ref 0.44–1.00)
Chloride: 102 mmol/L (ref 101–111)
GFR calc non Af Amer: >60 mL/min (ref >60)
GLUCOSE: 94 mg/dL (ref 65–99)
POTASSIUM: 4.1 mmol/L (ref 3.5–5.1)
SODIUM: 138 mmol/L (ref 135–145)
Total Protein: 6.9 g/dL (ref 6.5–8.1)

## 2017-01-06 LAB — URINALYSIS, COMPLETE (UACMP) WITH MICROSCOPIC
Bacteria, UA: NONE SEEN
Bilirubin Urine: NEGATIVE
GLUCOSE, UA: NEGATIVE mg/dL
Hgb urine dipstick: NEGATIVE
Ketones, ur: NEGATIVE mg/dL
Leukocytes, UA: NEGATIVE
Nitrite: NEGATIVE
PH: 5 (ref 5.0–8.0)
Protein, ur: NEGATIVE mg/dL
Specific Gravity, Urine: 1.011 (ref 1.005–1.030)

## 2017-01-06 LAB — LIPASE, BLOOD: Lipase: 41 U/L (ref 11–51)

## 2017-01-06 NOTE — ED Provider Notes (Addendum)
-----------------------------------------   6:55 PM on 01/06/2017 -----------------------------------------  The patient waited in her room for about an hour for me to see her due to the fact that I had multiple critical patients including a patient who passed away and the subsequent family notifications.  When I went in to see the patient a few minutes ago there is no one in the room.  her nurse was also the nurse for my deceased patient and has been tied up as well, so we are uncertain if she left without being seen or if she simply stepped out briefly.  We will watch for her to see if she returns to the exam room.  Of note, vital signs are stable and labs are all WNL.   ----------------------------------------- 7:41 PM on 01/06/2017 -----------------------------------------  Patient did not return to room and apparently left without being seen.   Loleta RoseForbach, Tifini Reeder, MD 01/06/17 Alveria Apley1941    Loleta RoseForbach, Juddson Cobern, MD 01/06/17 1945

## 2017-01-06 NOTE — ED Notes (Signed)
Pt not present in room. EDP York CeriseForbach notified.

## 2017-01-06 NOTE — ED Triage Notes (Signed)
Pt to ED c/o abd pain. Pt states that she is scheduled for endoscopy and colonoscopy at the end of the month. Pt states that her abd pain got worse today and she called her doctor and they advised that she come to the ED. Pt states that she is having pain in the upper abdomen, loss of appetite. Pt reports weight loss of about 10 pounds recently due to not being able to eat. Pt states that she feels bloating and does not have any energy.

## 2017-01-09 ENCOUNTER — Telehealth: Payer: Self-pay | Admitting: Emergency Medicine

## 2017-01-09 NOTE — Telephone Encounter (Signed)
Called patient due to lwot to inquire about condition and follow up plans. Says she has plans to follow up with dr Markham Jordanelliot.  I told her labs were complete and they can look at them.

## 2017-01-19 ENCOUNTER — Encounter: Payer: Self-pay | Admitting: *Deleted

## 2017-01-20 ENCOUNTER — Ambulatory Visit: Payer: Medicare Other | Admitting: Certified Registered Nurse Anesthetist

## 2017-01-20 ENCOUNTER — Ambulatory Visit
Admission: RE | Admit: 2017-01-20 | Discharge: 2017-01-20 | Disposition: A | Discharge status: Home / Self Care | Payer: Medicare Other | Source: Ambulatory Visit | Attending: Unknown Physician Specialty | Admitting: Unknown Physician Specialty

## 2017-01-20 ENCOUNTER — Encounter
Admission: RE | Disposition: A | Discharge status: Home / Self Care | Payer: Self-pay | Source: Ambulatory Visit | Attending: Unknown Physician Specialty

## 2017-01-20 ENCOUNTER — Encounter: Payer: Self-pay | Admitting: Certified Registered Nurse Anesthetist

## 2017-01-20 DIAGNOSIS — E785 Hyperlipidemia, unspecified: Secondary | ICD-10-CM | POA: Diagnosis not present

## 2017-01-20 DIAGNOSIS — K3189 Other diseases of stomach and duodenum: Secondary | ICD-10-CM | POA: Insufficient documentation

## 2017-01-20 DIAGNOSIS — K621 Rectal polyp: Secondary | ICD-10-CM | POA: Insufficient documentation

## 2017-01-20 DIAGNOSIS — Z888 Allergy status to other drugs, medicaments and biological substances status: Secondary | ICD-10-CM | POA: Insufficient documentation

## 2017-01-20 DIAGNOSIS — K640 First degree hemorrhoids: Secondary | ICD-10-CM | POA: Diagnosis not present

## 2017-01-20 DIAGNOSIS — K449 Diaphragmatic hernia without obstruction or gangrene: Secondary | ICD-10-CM | POA: Diagnosis not present

## 2017-01-20 DIAGNOSIS — H409 Unspecified glaucoma: Secondary | ICD-10-CM | POA: Diagnosis not present

## 2017-01-20 DIAGNOSIS — I10 Essential (primary) hypertension: Secondary | ICD-10-CM | POA: Insufficient documentation

## 2017-01-20 DIAGNOSIS — Z1211 Encounter for screening for malignant neoplasm of colon: Secondary | ICD-10-CM | POA: Insufficient documentation

## 2017-01-20 DIAGNOSIS — K2950 Unspecified chronic gastritis without bleeding: Secondary | ICD-10-CM | POA: Insufficient documentation

## 2017-01-20 DIAGNOSIS — J45909 Unspecified asthma, uncomplicated: Secondary | ICD-10-CM | POA: Insufficient documentation

## 2017-01-20 DIAGNOSIS — K210 Gastro-esophageal reflux disease with esophagitis: Secondary | ICD-10-CM | POA: Insufficient documentation

## 2017-01-20 DIAGNOSIS — F329 Major depressive disorder, single episode, unspecified: Secondary | ICD-10-CM | POA: Diagnosis not present

## 2017-01-20 DIAGNOSIS — Z86010 Personal history of colonic polyps: Secondary | ICD-10-CM | POA: Insufficient documentation

## 2017-01-20 DIAGNOSIS — M5030 Other cervical disc degeneration, unspecified cervical region: Secondary | ICD-10-CM | POA: Diagnosis not present

## 2017-01-20 DIAGNOSIS — E063 Autoimmune thyroiditis: Secondary | ICD-10-CM | POA: Diagnosis not present

## 2017-01-20 DIAGNOSIS — Z9849 Cataract extraction status, unspecified eye: Secondary | ICD-10-CM | POA: Insufficient documentation

## 2017-01-20 DIAGNOSIS — Z981 Arthrodesis status: Secondary | ICD-10-CM | POA: Insufficient documentation

## 2017-01-20 DIAGNOSIS — Z79899 Other long term (current) drug therapy: Secondary | ICD-10-CM | POA: Insufficient documentation

## 2017-01-20 DIAGNOSIS — I480 Paroxysmal atrial fibrillation: Secondary | ICD-10-CM | POA: Insufficient documentation

## 2017-01-20 DIAGNOSIS — M5136 Other intervertebral disc degeneration, lumbar region: Secondary | ICD-10-CM | POA: Insufficient documentation

## 2017-01-20 DIAGNOSIS — Z8673 Personal history of transient ischemic attack (TIA), and cerebral infarction without residual deficits: Secondary | ICD-10-CM | POA: Insufficient documentation

## 2017-01-20 DIAGNOSIS — Z885 Allergy status to narcotic agent status: Secondary | ICD-10-CM | POA: Insufficient documentation

## 2017-01-20 DIAGNOSIS — M1990 Unspecified osteoarthritis, unspecified site: Secondary | ICD-10-CM | POA: Insufficient documentation

## 2017-01-20 DIAGNOSIS — Z8249 Family history of ischemic heart disease and other diseases of the circulatory system: Secondary | ICD-10-CM | POA: Insufficient documentation

## 2017-01-20 DIAGNOSIS — K589 Irritable bowel syndrome without diarrhea: Secondary | ICD-10-CM | POA: Diagnosis not present

## 2017-01-20 DIAGNOSIS — Z881 Allergy status to other antibiotic agents status: Secondary | ICD-10-CM | POA: Insufficient documentation

## 2017-01-20 DIAGNOSIS — Z882 Allergy status to sulfonamides status: Secondary | ICD-10-CM | POA: Insufficient documentation

## 2017-01-20 DIAGNOSIS — G5600 Carpal tunnel syndrome, unspecified upper limb: Secondary | ICD-10-CM | POA: Diagnosis not present

## 2017-01-20 HISTORY — PX: ESOPHAGOGASTRODUODENOSCOPY (EGD) WITH PROPOFOL: SHX5813

## 2017-01-20 HISTORY — PX: COLONOSCOPY WITH PROPOFOL: SHX5780

## 2017-01-20 SURGERY — COLONOSCOPY WITH PROPOFOL
Anesthesia: General

## 2017-01-20 MED ORDER — PROPOFOL 10 MG/ML IV BOLUS
INTRAVENOUS | Status: DC | PRN
  Administered 2017-01-20: 30 mg via INTRAVENOUS

## 2017-01-20 MED ORDER — MIDAZOLAM HCL 2 MG/2ML IJ SOLN
INTRAMUSCULAR | Status: AC
  Filled 2017-01-20: qty 2

## 2017-01-20 MED ORDER — PROPOFOL 500 MG/50ML IV EMUL
INTRAVENOUS | Status: AC
  Filled 2017-01-20: qty 50

## 2017-01-20 MED ORDER — SODIUM CHLORIDE 0.9 % IV SOLN: INTRAVENOUS | Status: DC

## 2017-01-20 MED ORDER — MIDAZOLAM HCL 2 MG/2ML IJ SOLN
INTRAMUSCULAR | Status: DC | PRN
  Administered 2017-01-20: 1 mg via INTRAVENOUS

## 2017-01-20 MED ORDER — PHENYLEPHRINE HCL 10 MG/ML IJ SOLN
INTRAMUSCULAR | Status: DC | PRN
  Administered 2017-01-20: 100 ug via INTRAVENOUS

## 2017-01-20 MED ORDER — GLYCOPYRROLATE 0.2 MG/ML IJ SOLN
INTRAMUSCULAR | Status: AC
  Filled 2017-01-20: qty 1

## 2017-01-20 MED ORDER — PROPOFOL 500 MG/50ML IV EMUL
INTRAVENOUS | Status: DC | PRN
  Administered 2017-01-20: 140 ug/kg/min via INTRAVENOUS

## 2017-01-20 MED ORDER — SODIUM CHLORIDE 0.9 % IV SOLN
INTRAVENOUS | Status: DC
  Administered 2017-01-20: 1000 mL via INTRAVENOUS

## 2017-01-20 MED ORDER — LIDOCAINE HCL (CARDIAC) 20 MG/ML IV SOLN
INTRAVENOUS | Status: DC | PRN
  Administered 2017-01-20: 50 mg via INTRAVENOUS

## 2017-01-20 MED ORDER — GLYCOPYRROLATE 0.2 MG/ML IJ SOLN
INTRAMUSCULAR | Status: DC | PRN
  Administered 2017-01-20: 0.2 mg via INTRAVENOUS

## 2017-01-20 NOTE — Anesthesia Postprocedure Evaluation (Signed)
Anesthesia Post Note  Patient: Jacqueline Powers  Procedure(s) Performed: Procedure(s) (LRB): COLONOSCOPY WITH PROPOFOL (N/A) ESOPHAGOGASTRODUODENOSCOPY (EGD) WITH PROPOFOL (N/A)  Patient location during evaluation: PACU Anesthesia Type: General Level of consciousness: awake and alert and oriented Pain management: pain level controlled Vital Signs Assessment: post-procedure vital signs reviewed and stable Respiratory status: spontaneous breathing Cardiovascular status: blood pressure returned to baseline Anesthetic complications: no     Last Vitals:  Vitals:   01/20/17 1239 01/20/17 1249  BP: (!) 144/72 (!) 144/73  Pulse: 69 65  Resp: 12 16  Temp:    SpO2: 100% 100%    Last Pain:  Vitals:   01/20/17 1219  TempSrc:   PainSc: Asleep                 Kendon Sedeno

## 2017-01-20 NOTE — Anesthesia Preprocedure Evaluation (Signed)
Anesthesia Evaluation  Patient identified by MRN, date of birth, ID band Patient awake    Reviewed: Allergy & Precautions, NPO status , Patient's Chart, lab work & pertinent test results, reviewed documented beta blocker date and time   Airway Mallampati: II  TM Distance: >3 FB     Dental  (+) Caps   Pulmonary asthma ,    Pulmonary exam normal        Cardiovascular hypertension, Pt. on medications and Pt. on home beta blockers Normal cardiovascular exam+ dysrhythmias Atrial Fibrillation      Neuro/Psych TIAnegative psych ROS   GI/Hepatic Neg liver ROS, hiatal hernia, GERD  Medicated,  Endo/Other  Hypothyroidism Hyperthyroidism   Renal/GU negative Renal ROS     Musculoskeletal  (+) Arthritis , Osteoarthritis,    Abdominal Normal abdominal exam  (+)   Peds negative pediatric ROS (+)  Hematology negative hematology ROS (+)   Anesthesia Other Findings   Reproductive/Obstetrics                             Anesthesia Physical Anesthesia Plan  ASA: III  Anesthesia Plan: General   Post-op Pain Management:    Induction: Intravenous  PONV Risk Score and Plan:   Airway Management Planned: Nasal Cannula  Additional Equipment:   Intra-op Plan:   Post-operative Plan:   Informed Consent: I have reviewed the patients History and Physical, chart, labs and discussed the procedure including the risks, benefits and alternatives for the proposed anesthesia with the patient or authorized representative who has indicated his/her understanding and acceptance.   Dental advisory given  Plan Discussed with: CRNA and Surgeon  Anesthesia Plan Comments:         Anesthesia Quick Evaluation

## 2017-01-20 NOTE — Op Note (Signed)
[facility] Patient Name: Jacqueline Powers Procedure Date: 01/20/2017 11:17 AM MRN: [id number] Account #: [id number] Date of Birth: 25-Oct-1934 Admit Type: Outpatient Age: 82 Room: [facility] ENDO ROOM 1 Gender: Female Note Status: Finalized Procedure:            Colonoscopy Indications:          High risk colon cancer surveillance: Personal history                        of colonic polyps Providers:            Scot Jun, MD Referring MD:         Margaretann Loveless, MD (Referring MD) Medicines:            Propofol per Anesthesia Complications:        No immediate complications. Procedure:            Pre-Anesthesia Assessment:                       - After reviewing the risks and benefits, the patient                        was deemed in satisfactory condition to undergo the                        procedure.                       After obtaining informed consent, the colonoscope was                        passed under direct vision. Throughout the procedure,                        the patient's blood pressure, pulse, and oxygen                        saturations were monitored continuously. The                        Colonoscope was introduced through the anus and                        advanced to the the cecum, identified by appendiceal                        orifice and ileocecal valve. The colonoscopy was                        somewhat difficult due to significant looping and a                        tortuous colon. Successful completion of the procedure                        was aided by applying abdominal pressure. The patient                        tolerated the procedure well. The quality of the bowel  preparation was excellent. Findings:      The colon was completely clean with great visualization of the entire       colon. The mucosal lining was completely normal.      A diminutive polyp was found in the rectum. The  polyp was sessile. The       polyp was removed with a jumbo cold forceps. Resection and retrieval       were complete.      Internal hemorrhoids were found during endoscopy. The hemorrhoids were       small and Grade I (internal hemorrhoids that do not prolapse).      The exam was otherwise without abnormality. Impression:           - One diminutive polyp in the rectum, removed with a                        jumbo cold forceps. Resected and retrieved.                       - Internal hemorrhoids.                       - The examination was otherwise normal. Recommendation:       - Await pathology results. Scot Jun, MD 01/20/2017 12:08:35 PM This report has been signed electronically. Number of Addenda: 0 Note Initiated On: 01/20/2017 11:17 AM Scope Withdrawal Time: 0 hours 10 minutes 50 seconds  Total Procedure Duration: 0 hours 22 minutes 28 seconds       [facility]

## 2017-01-20 NOTE — Anesthesia Post-op Follow-up Note (Signed)
Anesthesia QCDR form completed.        

## 2017-01-20 NOTE — H&P (Signed)
Primary Care Physician:  Margaretann Loveless, MD Primary Gastroenterologist:  Dr. Mechele Collin  Pre-Procedure History & Physical: HPI:  Jacqueline Powers is a 82 y.o. female is here for an endoscopy and colonoscopy.   Past Medical History:  Diagnosis Date  . Arthritis   . Asthma   . Carpal tunnel syndrome   . Colonic polyp   . Degenerative joint disease of cervical and lumbar spine   . GERD (gastroesophageal reflux disease)   . Glaucoma   . Hashimoto's thyroiditis    a. 05/2002 s/p resection of thyroid nodule-->on replacement.  . Hiatal hernia   . History of depression   . Hyperlipidemia   . Hypertension   . Irritable bowel syndrome   . Osteoporosis   . PAF (paroxysmal atrial fibrillation) (HCC)    a. CHA2DS2VASc = 5-->xarelto;    . Perennial allergic rhinitis   . Right knee meniscal tear   . TIA (transient ischemic attack)    a. 06/2013: LUE/LLE wkns x 15 mins, MRI neg for CVA.    Past Surgical History:  Procedure Laterality Date  . BACK SURGERY     L5  . CATARACT EXTRACTION    . CERVICAL CONE BIOPSY    . CERVICAL FUSION    . COLONOSCOPY      Prior to Admission medications   Medication Sig Start Date End Date Taking? Authorizing Provider  cyclobenzaprine (FLEXERIL) 5 MG tablet Take 1 tablet (5 mg total) by mouth at bedtime. 10/26/16  Yes Willy Eddy, MD  diltiazem (CARDIZEM) 30 MG tablet Take 1 tablet (30 mg total) by mouth 3 (three) times daily as needed. 03/30/16  Yes Antonieta Iba, MD  fluticasone (FLONASE) 50 MCG/ACT nasal spray instill 2 sprays into each nostril at bedtime if needed 02/17/14  Yes [provider]  HYDROcodone-acetaminophen (NORCO) 5-325 MG tablet Take 1 tablet by mouth every 4 (four) hours as needed for moderate pain. 10/26/16  Yes Willy Eddy, MD  levothyroxine (SYNTHROID, LEVOTHROID) 25 MCG tablet Take 25 mcg by mouth daily before breakfast.  06/12/14  Yes [provider]  lidocaine (LIDODERM) 5 % Place 1 patch onto the skin  every 12 (twelve) hours. Remove & Discard patch within 12 hours or as directed by MD 10/26/16 10/26/17 Yes Willy Eddy, MD  LUMIGAN 0.01 % SOLN Place 1 drop into both eyes daily. 05/15/15  Yes [provider]  metoprolol succinate (TOPROL-XL) 25 MG 24 hr tablet Take 1 tablet (25 mg total) by mouth daily. 08/03/16  Yes Gollan, Tollie Pizza, MD  XARELTO 20 MG TABS tablet TAKE 1 TABLET DAILY WITH SUPPER 09/21/16  Yes Gollan, Tollie Pizza, MD  ezetimibe (ZETIA) 10 MG tablet Take 1 tablet (10 mg total) by mouth daily. 09/06/16 10/06/16  Antonieta Iba, MD  rosuvastatin (CRESTOR) 5 MG tablet Take 1 tablet (5 mg total) by mouth daily at 6 PM. 09/06/16 12/05/16  Antonieta Iba, MD    Allergies as of 12/26/2016 - Review Complete 10/26/2016  Allergen Reaction Noted  . Amoxicillin-pot clavulanate Nausea And Vomiting 06/19/2015  . Codeine  08/17/2012  . Other  02/10/2014  . Septra [sulfamethoxazole-trimethoprim]  08/17/2012  . Simvastatin  08/17/2012  . Sulfa antibiotics  08/17/2012    Family History  Problem Relation Age of Onset  . Hypertension Sister     Social History   Social History  . Marital status: Widowed    Spouse name: N/A  . Number of children: N/A  . Years of education: N/A  Occupational History  . Not on file.   Social History Main Topics  . Smoking status: Never Smoker  . Smokeless tobacco: Never Used  . Alcohol use Yes     Comment: occasional  . Drug use: No  . Sexual activity: No   Other Topics Concern  . Not on file   Social History Narrative  . No narrative on file    Review of Systems: See HPI, otherwise negative ROS  Physical Exam: BP 124/80   Pulse 78   Temp 97.7 F (36.5 C) (Tympanic)   Resp 16   Ht 5' 3.5" (1.613 m)   Wt 54.4 kg (120 lb)   SpO2 100%   BMI 20.92 kg/m  General:   Alert,  pleasant and cooperative in NAD Head:  Normocephalic and atraumatic. Neck:  Supple; no masses or thyromegaly. Lungs:  Clear throughout to  auscultation.    Heart:  Regular rate and rhythm. Abdomen:  Soft, nontender and nondistended. Normal bowel sounds, without guarding, and without rebound.   Neurologic:  Alert and  oriented x4;  grossly normal neurologically.  Impression/Plan: NIAMBI SMOAK is here for an endoscopy and colonoscopy to be performed for abdominal pain, PH colon polyps.  Risks, benefits, limitations, and alternatives regarding  endoscopy and colonoscopy have been reviewed with the patient.  Questions have been answered.  All parties agreeable.   [name], MD  01/20/2017, 11:22 AM

## 2017-01-20 NOTE — Transfer of Care (Signed)
Immediate Anesthesia Transfer of Care Note  Patient: Jacqueline Powers  Procedure(s) Performed: Procedure(s): COLONOSCOPY WITH PROPOFOL (N/A) ESOPHAGOGASTRODUODENOSCOPY (EGD) WITH PROPOFOL (N/A)  Patient Location: PACU  Anesthesia Type:General  Level of Consciousness: sedated  Airway & Oxygen Therapy: Patient Spontanous Breathing and Patient connected to nasal cannula oxygen  Post-op Assessment: Report given to RN and Post -op Vital signs reviewed and stable  Post vital signs: Reviewed and stable  Last Vitals:  Vitals:   01/20/17 1039 01/20/17 1209  BP: 124/80 (!) 110/43  Pulse: 78 75  Resp: 16 14  Temp: 36.5 C (!) 36 C  SpO2: 100% 100%    Last Pain:  Vitals:   01/20/17 1209  TempSrc: Tympanic  PainSc: Asleep         Complications: No apparent anesthesia complications

## 2017-01-20 NOTE — Op Note (Signed)
[facility] Patient Name: Jacqueline Powers Procedure Date: 01/20/2017 11:18 AM MRN: [id number] Account #: [id number] Date of Birth: 04/20/1935 Admit Type: Outpatient Age: 82 Room: [facility] ENDO ROOM 1 Gender: Female Note Status: Finalized Procedure:            Upper GI endoscopy Indications:          Epigastric abdominal pain, Suspected gastro-esophageal                        reflux disease Providers:            Scot Jun, MD Referring MD:         Margaretann Loveless, MD (Referring MD) Medicines:            Propofol per Anesthesia Complications:        No immediate complications. Procedure:            Pre-Anesthesia Assessment:                       - After reviewing the risks and benefits, the patient                        was deemed in satisfactory condition to undergo the                        procedure.                       After obtaining informed consent, the endoscope was                        passed under direct vision. Throughout the procedure,                        the patient's blood pressure, pulse, and oxygen                        saturations were monitored continuously. The Endoscope                        was introduced through the mouth, and advanced to the                        second part of duodenum. The upper GI endoscopy was                        accomplished without difficulty. The patient tolerated                        the procedure well. Findings:      LA Grade A (one or more mucosal breaks less than 5 mm, not extending       between tops of 2 mucosal folds) esophagitis with no bleeding was found       38 cm from the incisors. Biopsies were taken with a cold forceps for       histology.      Patchy mildly erythematous mucosa without bleeding was found in the       gastric body and in the gastric antrum. Biopsies were taken with a cold       forceps for histology. Biopsies were taken with a  cold forceps for   Helicobacter pylori testing.      Patchy mildly erythematous mucosa without active bleeding and with no       stigmata of bleeding was found scattered in the duodenal bulb.      The second portion of the duodenum was normal. Impression:           - LA Grade A reflux esophagitis. Biopsied.                       - Erythematous mucosa in the gastric body and antrum.                        Biopsied.                       - Erythematous duodenopathy.                       - Normal second portion of the duodenum. Recommendation:       - Await pathology results. Scot Jun, MD 01/20/2017 11:39:53 AM This report has been signed electronically. Number of Addenda: 0 Note Initiated On: 01/20/2017 11:18 AM      [facility]

## 2017-01-20 NOTE — Anesthesia Procedure Notes (Signed)
Date/Time: 01/20/2017 11:25 AM Performed by: Ginger Carne Pre-anesthesia Checklist: Patient identified, Emergency Drugs available, Suction available, Patient being monitored and Timeout performed Patient Re-evaluated:Patient Re-evaluated prior to induction Oxygen Delivery Method: Nasal cannula Preoxygenation: Pre-oxygenation with 100% oxygen

## 2017-01-20 NOTE — Anesthesia Preprocedure Evaluation (Deleted)
Anesthesia Evaluation  Patient identified by MRN, date of birth, ID band Patient awake    Reviewed: Allergy & Precautions, NPO status , Patient's Chart, lab work & pertinent test results, reviewed documented beta blocker date and time   Airway Mallampati: II  TM Distance: >3 FB     Dental  (+) Caps   Pulmonary asthma ,    Pulmonary exam normal        Cardiovascular hypertension, Pt. on medications and Pt. on home beta blockers Normal cardiovascular exam     Neuro/Psych TIAnegative psych ROS   GI/Hepatic Neg liver ROS, hiatal hernia, GERD  ,  Endo/Other  Hypothyroidism Hyperthyroidism   Renal/GU negative Renal ROS     Musculoskeletal  (+) Arthritis , Osteoarthritis,    Abdominal Normal abdominal exam  (+)   Peds negative pediatric ROS (+)  Hematology   Anesthesia Other Findings   Reproductive/Obstetrics                            Anesthesia Physical Anesthesia Plan  ASA: III  Anesthesia Plan: General   Post-op Pain Management:    Induction:   PONV Risk Score and Plan:   Airway Management Planned: Nasal Cannula  Additional Equipment:   Intra-op Plan:   Post-operative Plan:   Informed Consent: I have reviewed the patients History and Physical, chart, labs and discussed the procedure including the risks, benefits and alternatives for the proposed anesthesia with the patient or authorized representative who has indicated his/her understanding and acceptance.   Dental advisory given  Plan Discussed with: CRNA and Surgeon  Anesthesia Plan Comments:         Anesthesia Quick Evaluation

## 2017-01-23 ENCOUNTER — Encounter: Payer: Self-pay | Admitting: Unknown Physician Specialty

## 2017-01-23 LAB — SURGICAL PATHOLOGY

## 2017-03-20 ENCOUNTER — Other Ambulatory Visit: Payer: Self-pay | Admitting: Student

## 2017-03-20 DIAGNOSIS — R1084 Generalized abdominal pain: Secondary | ICD-10-CM

## 2017-03-28 ENCOUNTER — Ambulatory Visit
Admission: RE | Admit: 2017-03-28 | Discharge: 2017-03-28 | Disposition: A | Discharge status: Home / Self Care | Payer: Medicare Other | Source: Ambulatory Visit | Attending: Student | Admitting: Student

## 2017-03-28 DIAGNOSIS — K760 Fatty (change of) liver, not elsewhere classified: Secondary | ICD-10-CM | POA: Insufficient documentation

## 2017-03-28 DIAGNOSIS — K449 Diaphragmatic hernia without obstruction or gangrene: Secondary | ICD-10-CM | POA: Diagnosis not present

## 2017-03-28 DIAGNOSIS — R935 Abnormal findings on diagnostic imaging of other abdominal regions, including retroperitoneum: Secondary | ICD-10-CM | POA: Insufficient documentation

## 2017-03-28 DIAGNOSIS — K5900 Constipation, unspecified: Secondary | ICD-10-CM | POA: Insufficient documentation

## 2017-03-28 DIAGNOSIS — R1084 Generalized abdominal pain: Secondary | ICD-10-CM | POA: Diagnosis present

## 2017-03-28 LAB — POCT I-STAT CREATININE: CREATININE: 0.9 mg/dL (ref 0.44–1.00)

## 2017-03-28 MED ORDER — IOPAMIDOL (ISOVUE-300) INJECTION 61%
100.0000 mL | Freq: Once | INTRAVENOUS | Status: AC | PRN
  Administered 2017-03-28: 100 mL via INTRAVENOUS

## 2017-04-28 ENCOUNTER — Ambulatory Visit: Payer: Medicare Other | Admitting: Anesthesiology

## 2017-04-28 ENCOUNTER — Ambulatory Visit
Admission: RE | Admit: 2017-04-28 | Discharge: 2017-04-28 | Disposition: A | Discharge status: Home / Self Care | Payer: Medicare Other | Source: Ambulatory Visit | Attending: Unknown Physician Specialty | Admitting: Unknown Physician Specialty

## 2017-04-28 ENCOUNTER — Encounter
Admission: RE | Disposition: A | Discharge status: Home / Self Care | Payer: Self-pay | Source: Ambulatory Visit | Attending: Unknown Physician Specialty

## 2017-04-28 DIAGNOSIS — F329 Major depressive disorder, single episode, unspecified: Secondary | ICD-10-CM | POA: Insufficient documentation

## 2017-04-28 DIAGNOSIS — Z86010 Personal history of colonic polyps: Secondary | ICD-10-CM | POA: Insufficient documentation

## 2017-04-28 DIAGNOSIS — I499 Cardiac arrhythmia, unspecified: Secondary | ICD-10-CM | POA: Insufficient documentation

## 2017-04-28 DIAGNOSIS — M1990 Unspecified osteoarthritis, unspecified site: Secondary | ICD-10-CM | POA: Diagnosis not present

## 2017-04-28 DIAGNOSIS — Z981 Arthrodesis status: Secondary | ICD-10-CM | POA: Diagnosis not present

## 2017-04-28 DIAGNOSIS — H409 Unspecified glaucoma: Secondary | ICD-10-CM | POA: Insufficient documentation

## 2017-04-28 DIAGNOSIS — Z885 Allergy status to narcotic agent status: Secondary | ICD-10-CM | POA: Insufficient documentation

## 2017-04-28 DIAGNOSIS — Z79899 Other long term (current) drug therapy: Secondary | ICD-10-CM | POA: Diagnosis not present

## 2017-04-28 DIAGNOSIS — E039 Hypothyroidism, unspecified: Secondary | ICD-10-CM | POA: Diagnosis not present

## 2017-04-28 DIAGNOSIS — E785 Hyperlipidemia, unspecified: Secondary | ICD-10-CM | POA: Diagnosis not present

## 2017-04-28 DIAGNOSIS — Z881 Allergy status to other antibiotic agents status: Secondary | ICD-10-CM | POA: Insufficient documentation

## 2017-04-28 DIAGNOSIS — K2970 Gastritis, unspecified, without bleeding: Secondary | ICD-10-CM | POA: Diagnosis not present

## 2017-04-28 DIAGNOSIS — M810 Age-related osteoporosis without current pathological fracture: Secondary | ICD-10-CM | POA: Insufficient documentation

## 2017-04-28 DIAGNOSIS — J45909 Unspecified asthma, uncomplicated: Secondary | ICD-10-CM | POA: Diagnosis not present

## 2017-04-28 DIAGNOSIS — K222 Esophageal obstruction: Secondary | ICD-10-CM | POA: Insufficient documentation

## 2017-04-28 DIAGNOSIS — E063 Autoimmune thyroiditis: Secondary | ICD-10-CM | POA: Diagnosis not present

## 2017-04-28 DIAGNOSIS — Z8673 Personal history of transient ischemic attack (TIA), and cerebral infarction without residual deficits: Secondary | ICD-10-CM | POA: Insufficient documentation

## 2017-04-28 DIAGNOSIS — G5600 Carpal tunnel syndrome, unspecified upper limb: Secondary | ICD-10-CM | POA: Insufficient documentation

## 2017-04-28 DIAGNOSIS — I480 Paroxysmal atrial fibrillation: Secondary | ICD-10-CM | POA: Diagnosis not present

## 2017-04-28 DIAGNOSIS — Z9849 Cataract extraction status, unspecified eye: Secondary | ICD-10-CM | POA: Insufficient documentation

## 2017-04-28 DIAGNOSIS — M479 Spondylosis, unspecified: Secondary | ICD-10-CM | POA: Diagnosis not present

## 2017-04-28 DIAGNOSIS — K449 Diaphragmatic hernia without obstruction or gangrene: Secondary | ICD-10-CM | POA: Insufficient documentation

## 2017-04-28 DIAGNOSIS — Z882 Allergy status to sulfonamides status: Secondary | ICD-10-CM | POA: Insufficient documentation

## 2017-04-28 DIAGNOSIS — K589 Irritable bowel syndrome without diarrhea: Secondary | ICD-10-CM | POA: Insufficient documentation

## 2017-04-28 DIAGNOSIS — K219 Gastro-esophageal reflux disease without esophagitis: Secondary | ICD-10-CM | POA: Insufficient documentation

## 2017-04-28 DIAGNOSIS — Z888 Allergy status to other drugs, medicaments and biological substances status: Secondary | ICD-10-CM | POA: Insufficient documentation

## 2017-04-28 HISTORY — DX: Hypothyroidism, unspecified: E03.9

## 2017-04-28 HISTORY — DX: Cardiac arrhythmia, unspecified: I49.9

## 2017-04-28 HISTORY — PX: ESOPHAGOGASTRODUODENOSCOPY (EGD) WITH PROPOFOL: SHX5813

## 2017-04-28 SURGERY — ESOPHAGOGASTRODUODENOSCOPY (EGD) WITH PROPOFOL
Anesthesia: General

## 2017-04-28 MED ORDER — SODIUM CHLORIDE 0.9 % IV SOLN: INTRAVENOUS | Status: DC

## 2017-04-28 MED ORDER — LIDOCAINE HCL (PF) 2 % IJ SOLN
INTRAMUSCULAR | Status: DC | PRN
Start: 2017-04-28 — End: 2017-04-28
  Administered 2017-04-28: 50 mg via INTRADERMAL

## 2017-04-28 MED ORDER — PROPOFOL 500 MG/50ML IV EMUL
INTRAVENOUS | Status: AC
  Filled 2017-04-28: qty 50

## 2017-04-28 MED ORDER — SODIUM CHLORIDE 0.9 % IV SOLN
INTRAVENOUS | Status: DC
  Administered 2017-04-28: 10:00:00 via INTRAVENOUS

## 2017-04-28 MED ORDER — PROPOFOL 500 MG/50ML IV EMUL
INTRAVENOUS | Status: DC | PRN
  Administered 2017-04-28: 150 ug/kg/min via INTRAVENOUS

## 2017-04-28 MED ORDER — SODIUM CHLORIDE 0.9 % IV SOLN
INTRAVENOUS | Status: DC | PRN
  Administered 2017-04-28: 10:00:00 via INTRAVENOUS

## 2017-04-28 MED ORDER — PROPOFOL 10 MG/ML IV BOLUS
INTRAVENOUS | Status: DC | PRN
  Administered 2017-04-28: 50 mg via INTRAVENOUS

## 2017-04-28 NOTE — Transfer of Care (Signed)
Immediate Anesthesia Transfer of Care Note  Patient: Jacqueline Powers  Procedure(s) Performed: ESOPHAGOGASTRODUODENOSCOPY (EGD) WITH PROPOFOL (N/A )  Patient Location: Endoscopy Unit  Anesthesia Type:General  Level of Consciousness: awake and alert   Airway & Oxygen Therapy: Patient Spontanous Breathing and Patient connected to nasal cannula oxygen  Post-op Assessment: Report given to RN and Post -op Vital signs reviewed and stable  Post vital signs: Reviewed and stable  Last Vitals:  Vitals:   04/28/17 0931 04/28/17 1030  BP: 126/72 (!) (P) 101/53  Pulse: 85 (P) 73  Resp: 16 (P) 12  Temp: (!) 36 C (!) (P) 35.5 C  SpO2:  (P) 100%    Last Pain:  Vitals:   04/28/17 1030  TempSrc: (P) Tympanic         Complications: No apparent anesthesia complications

## 2017-04-28 NOTE — H&P (Signed)
Primary Care Physician:  Margaretann LovelessKhan, Neelam S, MD Primary Gastroenterologist:  Dr. Mechele CollinElliott  Pre-Procedure History & Physical: HPI:  Jacqueline DouglasMarianne M Powers is a 82 y.o. female is here for an endoscopy.   Past Medical History:  Diagnosis Date  . Arthritis   . Asthma   . Carpal tunnel syndrome   . Colonic polyp   . Degenerative joint disease of cervical and lumbar spine   . Dysrhythmia   . GERD (gastroesophageal reflux disease)   . Glaucoma   . Hashimoto's thyroiditis    a. 05/2002 s/p resection of thyroid nodule-->on replacement.  . Hiatal hernia   . History of depression   . Hyperlipidemia   . Hypothyroidism   . Irritable bowel syndrome   . Osteoporosis   . PAF (paroxysmal atrial fibrillation) (HCC)    a. CHA2DS2VASc = 5-->xarelto;    . Perennial allergic rhinitis   . Right knee meniscal tear   . TIA (transient ischemic attack)    a. 06/2013: LUE/LLE wkns x 15 mins, MRI neg for CVA.    Past Surgical History:  Procedure Laterality Date  . BACK SURGERY     L5  . CATARACT EXTRACTION    . CERVICAL CONE BIOPSY    . CERVICAL FUSION    . COLONOSCOPY    . COLONOSCOPY WITH PROPOFOL N/A 01/20/2017   Procedure: COLONOSCOPY WITH PROPOFOL;  Surgeon: Scot JunElliott, Asberry Lascola T, MD;  [facility];  Service: Endoscopy;  Laterality: N/A;  . ESOPHAGOGASTRODUODENOSCOPY (EGD) WITH PROPOFOL N/A 01/20/2017   Procedure: ESOPHAGOGASTRODUODENOSCOPY (EGD) WITH PROPOFOL;  Surgeon: Scot JunElliott, Carrell Palmatier T, MD;  Location[facility] END[facility]COPY;  Service: Endoscopy;  Laterality: N/A;    Prior to Admission medications   Medication Sig Start Date End Date Taking? Authorizing Provider  fluticasone (FLONASE) 50 MCG/ACT nasal spray instill 2 sprays into each nostril at bedtime if needed 02/17/14  Yes [provider]  levothyroxine (SYNTHROID, LEVOTHROID) 25 MCG tablet Take 25 mcg by mouth daily before breakfast.  06/12/14  Yes [provider]  LUMIGAN 0.01 % SOLN Place 1 drop into both eyes daily. 05/15/15   Yes [provider]  metoprolol succinate (TOPROL-XL) 25 MG 24 hr tablet Take 1 tablet (25 mg total) by mouth daily. 08/03/16  Yes Gollan, Tollie Pizzaimothy J, MD  cyclobenzaprine (FLEXERIL) 5 MG tablet Take 1 tablet (5 mg total) by mouth at bedtime. 10/26/16   Willy Eddyobinson, Patrick, MD  diltiazem (CARDIZEM) 30 MG tablet Take 1 tablet (30 mg total) by mouth 3 (three) times daily as needed. Patient not taking: Reported on 04/28/2017 03/30/16   Antonieta IbaGollan, Timothy J, MD  ezetimibe (ZETIA) 10 MG tablet Take 1 tablet (10 mg total) by mouth daily. 09/06/16 10/06/16  Antonieta IbaGollan, Timothy J, MD  HYDROcodone-acetaminophen (NORCO) 5-325 MG tablet Take 1 tablet by mouth every 4 (four) hours as needed for moderate pain. Patient not taking: Reported on 04/28/2017 10/26/16   Willy Eddyobinson, Patrick, MD  lidocaine (LIDODERM) 5 % Place 1 patch onto the skin every 12 (twelve) hours. Remove & Discard patch within 12 hours or as directed by MD Patient not taking: Reported on 04/28/2017 10/26/16 10/26/17  Willy Eddyobinson, Patrick, MD  rosuvastatin (CRESTOR) 5 MG tablet Take 1 tablet (5 mg total) by mouth daily at 6 PM. 09/06/16 12/05/16  Antonieta IbaGollan, Timothy J, MD  XARELTO 20 MG TABS tablet TAKE 1 TABLET DAILY WITH SUPPER 09/21/16   Antonieta IbaGollan, Timothy J, MD    Allergies as of 04/14/2017 - Review Complete 03/28/2017  Allergen Reaction Noted  . Amoxicillin-pot  clavulanate Nausea And Vomiting 06/19/2015  . Codeine  08/17/2012  . Other  02/10/2014  . Septra [sulfamethoxazole-trimethoprim]  08/17/2012  . Simvastatin  08/17/2012  . Sulfa antibiotics  08/17/2012    Family History  Problem Relation Age of Onset  . Hypertension Sister     Social History   Socioeconomic History  . Marital status: Widowed    Spouse name: Not on file  . Number of children: Not on file  . Years of education: Not on file  . Highest education level: Not on file  Social Needs  . Financial resource strain: Not on file  . Food insecurity - worry: Not on file  . Food insecurity  - inability: Not on file  . Transportation needs - medical: Not on file  . Transportation needs - non-medical: Not on file  Occupational History  . Not on file  Tobacco Use  . Smoking status: Never Smoker  . Smokeless tobacco: Never Used  Substance and Sexual Activity  . Alcohol use: Yes    Comment: none last 24hrs  . Drug use: No  . Sexual activity: No    Birth control/protection: None  Other Topics Concern  . Not on file  Social History Narrative  . Not on file    Review of Systems: See HPI, otherwise negative ROS  Physical Exam: BP 126/72   Pulse 85   Temp (!) 96.8 F (36 C) (Oral)   Resp 16   Ht 5\' 4"  (1.626 m)   Wt 54.4 kg (120 lb)   BMI 20.60 kg/m  General:   Alert,  pleasant and cooperative in NAD Head:  Normocephalic and atraumatic. Neck:  Supple; no masses or thyromegaly. Lungs:  Clear throughout to auscultation.    Heart:  Regular rate and rhythm. Abdomen:  Soft, nontender and nondistended. Normal bowel sounds, without guarding, and without rebound.   Neurologic:  Alert and  oriented x4;  grossly normal neurologically.  Impression/Plan: Jacqueline Powers is here for an endoscopy to be performed for Abnormal CT report of thickening of pylorus.  Risks, benefits, limitations, and alternatives regarding  endoscopy have been reviewed with the patient.  Questions have been answered.  All parties agreeable.   Lynnae PrudeELLIOTT, Dashonna Chagnon, MD  04/28/2017, 10:16 AM

## 2017-04-28 NOTE — Anesthesia Post-op Follow-up Note (Signed)
Anesthesia QCDR form completed.        

## 2017-04-28 NOTE — Op Note (Signed)
[facility]enter[facility] Patient Name: Jacqueline RoyalsMarianne Truss Procedure Date: 04/28/2017 10:14 AM MRN: [id number] Account #: [id number] Date of Birth: 02/24/1935 Admit Type: Outpatient Age: [age] [facility] ROOM 3 Gender: Female Note Status: Finalized Procedure:            Upper GI endoscopy Indications:          Dysphagia, Abnormal CT of the GI tract Providers:            Scot Junobert T. Deven Audi, MD Referring MD:         Margaretann LovelessNeelam S. Khan, MD (Referring MD) Medicines:            Propofol per Anesthesia Complications:        No immediate complications. Procedure:            Pre-Anesthesia Assessment:                       - After reviewing the risks and benefits, the patient                        was deemed in satisfactory condition to undergo the                        procedure.                       After obtaining informed consent, the endoscope was                        passed under direct vision. Throughout the procedure,                        the patient's blood pressure, pulse, and oxygen                        saturations were monitored continuously. The Endoscope                        was introduced through the mouth, and advanced to the                        second part of duodenum. The upper GI endoscopy was                        accomplished without difficulty. The patient tolerated                        the procedure well. Findings:      A mild Schatzki ring (acquired) was found at the gastroesophageal       junction. A guidewire was placed and the scope was withdrawn. Dilation       was performed with a Savary dilator with mild resistance at 17 mm.      Patchy mildly erythematous mucosa without bleeding was found in the       gastric antrum. Biopsies were taken with a cold forceps for histology.       Done very near the pylorus.The pyloric are was normal. This was the area       on CT which prompted a repeat EGD.      The examined duodenum was  normal. Impression:           -  Mild Schatzki ring. Dilated.                       - Erythematous mucosa in the antrum. Biopsied.                       - Normal examined duodenum. Recommendation:       - Await pathology results. Scot Junobert T Betsaida [place], MD 04/28/2017 10:32:27 AM This report has been signed electronically. Number of Addenda: 0 Note Initiated On: 04/28/2017 10:14 AM      [facility]

## 2017-04-28 NOTE — Anesthesia Postprocedure Evaluation (Signed)
Anesthesia Post Note  Patient: [name]  Procedure(s) Performed: ESOPHAGOGASTRODUODENOSCOPY (EGD) WITH PROPOFOL (N/A )  Patient location during evaluation: Endoscopy Anesthesia Type: General Level of consciousness: awake and alert and oriented Pain management: pain level controlled Vital Signs Assessment: post-procedure vital signs reviewed and stable Respiratory status: spontaneous breathing, nonlabored ventilation and respiratory function stable Cardiovascular status: blood pressure returned to baseline and stable Postop Assessment: no signs of nausea or vomiting Anesthetic complications: no     Last Vitals:  Vitals:   04/28/17 1040 04/28/17 1050  BP: (!) 101/53 (!) 103/55  Pulse: 74 72  Resp: 14 16  Temp:    SpO2: 100% 100%    Last Pain:  Vitals:   04/28/17 1030  TempSrc: Tympanic                 Cella Cappello

## 2017-04-28 NOTE — Anesthesia Preprocedure Evaluation (Signed)
Anesthesia Evaluation  Patient identified by MRN, date of birth, ID band Patient awake    Reviewed: Allergy & Precautions, NPO status , Patient's Chart, lab work & pertinent test results  History of Anesthesia Complications Negative for: history of anesthetic complications  Airway Mallampati: II  TM Distance: >3 FB Neck ROM: Full    Dental no notable dental hx.    Pulmonary asthma , neg sleep apnea, neg COPD,    breath sounds clear to auscultation- rhonchi (-) wheezing      Cardiovascular Exercise Tolerance: Good (-) hypertension(-) CAD, (-) Past MI and (-) Cardiac Stents + dysrhythmias Atrial Fibrillation  Rhythm:Regular Rate:Normal - Systolic murmurs and - Diastolic murmurs    Neuro/Psych TIAnegative psych ROS   GI/Hepatic Neg liver ROS, hiatal hernia, GERD  ,  Endo/Other  neg diabetesHypothyroidism   Renal/GU negative Renal ROS     Musculoskeletal  (+) Arthritis ,   Abdominal (+) - obese,   Peds  Hematology negative hematology ROS (+)   Anesthesia Other Findings Past Medical History: No date: Arthritis No date: Asthma No date: Carpal tunnel syndrome No date: Colonic polyp No date: Degenerative joint disease of cervical and lumbar spine No date: Dysrhythmia No date: GERD (gastroesophageal reflux disease) No date: Glaucoma No date: Hashimoto's thyroiditis     Comment:  a. 05/2002 s/p resection of thyroid nodule-->on               replacement. No date: Hiatal hernia No date: History of depression No date: Hyperlipidemia No date: Hypothyroidism No date: Irritable bowel syndrome No date: Osteoporosis No date: PAF (paroxysmal atrial fibrillation) (HCC)     Comment:  a. CHA2DS2VASc = 5-->xarelto;   No date: Perennial allergic rhinitis No date: Right knee meniscal tear No date: TIA (transient ischemic attack)     Comment:  a. 06/2013: LUE/LLE wkns x 15 mins, MRI neg for CVA.   Reproductive/Obstetrics                              Anesthesia Physical Anesthesia Plan  ASA: III  Anesthesia Plan: General   Post-op Pain Management:    Induction: Intravenous  PONV Risk Score and Plan: 2 and Propofol infusion  Airway Management Planned: Natural Airway  Additional Equipment:   Intra-op Plan:   Post-operative Plan:   Informed Consent: I have reviewed the patients History and Physical, chart, labs and discussed the procedure including the risks, benefits and alternatives for the proposed anesthesia with the patient or authorized representative who has indicated his/her understanding and acceptance.   Dental advisory given  Plan Discussed with: CRNA and Anesthesiologist  Anesthesia Plan Comments:         Anesthesia Quick Evaluation

## 2017-05-01 ENCOUNTER — Encounter: Payer: Self-pay | Admitting: Unknown Physician Specialty

## 2017-05-02 ENCOUNTER — Other Ambulatory Visit: Payer: Self-pay | Admitting: Cardiovascular Disease

## 2017-05-02 MED ORDER — METOPROLOL SUCCINATE ER 25 MG PO TB24: 25.0000 mg | ORAL_TABLET | Freq: Every day | ORAL | 3 refills | Status: DC

## 2017-05-11 LAB — SURGICAL PATHOLOGY

## 2017-08-22 ENCOUNTER — Emergency Department: Payer: Medicare Other

## 2017-08-22 ENCOUNTER — Encounter: Payer: Self-pay | Admitting: Emergency Medicine

## 2017-08-22 ENCOUNTER — Observation Stay
Admission: EM | Admit: 2017-08-22 | Discharge: 2017-08-24 | Disposition: A | Discharge status: Home / Self Care | Payer: Medicare Other | Attending: Internal Medicine | Admitting: Internal Medicine

## 2017-08-22 DIAGNOSIS — N289 Disorder of kidney and ureter, unspecified: Secondary | ICD-10-CM | POA: Diagnosis not present

## 2017-08-22 DIAGNOSIS — Z888 Allergy status to other drugs, medicaments and biological substances status: Secondary | ICD-10-CM | POA: Insufficient documentation

## 2017-08-22 DIAGNOSIS — Z880 Allergy status to penicillin: Secondary | ICD-10-CM | POA: Diagnosis not present

## 2017-08-22 DIAGNOSIS — R109 Unspecified abdominal pain: Secondary | ICD-10-CM | POA: Diagnosis present

## 2017-08-22 DIAGNOSIS — A09 Infectious gastroenteritis and colitis, unspecified: Secondary | ICD-10-CM | POA: Insufficient documentation

## 2017-08-22 DIAGNOSIS — N179 Acute kidney failure, unspecified: Principal | ICD-10-CM | POA: Insufficient documentation

## 2017-08-22 DIAGNOSIS — Z8673 Personal history of transient ischemic attack (TIA), and cerebral infarction without residual deficits: Secondary | ICD-10-CM | POA: Diagnosis not present

## 2017-08-22 DIAGNOSIS — Z885 Allergy status to narcotic agent status: Secondary | ICD-10-CM | POA: Diagnosis not present

## 2017-08-22 DIAGNOSIS — K219 Gastro-esophageal reflux disease without esophagitis: Secondary | ICD-10-CM | POA: Diagnosis not present

## 2017-08-22 DIAGNOSIS — Z7901 Long term (current) use of anticoagulants: Secondary | ICD-10-CM | POA: Diagnosis not present

## 2017-08-22 DIAGNOSIS — Z882 Allergy status to sulfonamides status: Secondary | ICD-10-CM | POA: Diagnosis not present

## 2017-08-22 DIAGNOSIS — Z79899 Other long term (current) drug therapy: Secondary | ICD-10-CM | POA: Insufficient documentation

## 2017-08-22 DIAGNOSIS — F329 Major depressive disorder, single episode, unspecified: Secondary | ICD-10-CM | POA: Diagnosis not present

## 2017-08-22 DIAGNOSIS — I480 Paroxysmal atrial fibrillation: Secondary | ICD-10-CM | POA: Insufficient documentation

## 2017-08-22 DIAGNOSIS — E785 Hyperlipidemia, unspecified: Secondary | ICD-10-CM | POA: Diagnosis not present

## 2017-08-22 DIAGNOSIS — E039 Hypothyroidism, unspecified: Secondary | ICD-10-CM | POA: Diagnosis not present

## 2017-08-22 DIAGNOSIS — Z79890 Hormone replacement therapy (postmenopausal): Secondary | ICD-10-CM | POA: Diagnosis not present

## 2017-08-22 DIAGNOSIS — E860 Dehydration: Secondary | ICD-10-CM

## 2017-08-22 LAB — CBC
HCT: 48.9 % — ABNORMAL HIGH (ref 35.0–47.0)
HEMOGLOBIN: 16.2 g/dL — AB (ref 12.0–16.0)
MCH: 31.1 pg (ref 26.0–34.0)
MCHC: 33.1 g/dL (ref 32.0–36.0)
MCV: 93.9 fL (ref 80.0–100.0)
PLATELETS: 215 10*3/uL (ref 150–440)
RBC: 5.2 MIL/uL (ref 3.80–5.20)
RDW: 13.9 % (ref 11.5–14.5)
WBC: 9.8 10*3/uL (ref 3.6–11.0)

## 2017-08-22 LAB — COMPREHENSIVE METABOLIC PANEL
ALBUMIN: 4.5 g/dL (ref 3.5–5.0)
ALT: 21 U/L (ref 14–54)
AST: 42 U/L — AB (ref 15–41)
Alkaline Phosphatase: 73 U/L (ref 38–126)
Anion gap: 17 — ABNORMAL HIGH (ref 5–15)
BUN: 32 mg/dL — AB (ref 6–20)
CO2: 20 mmol/L — AB (ref 22–32)
CREATININE: 1.6 mg/dL — AB (ref 0.44–1.00)
Calcium: 9 mg/dL (ref 8.9–10.3)
Chloride: 102 mmol/L (ref 101–111)
GFR calc Af Amer: 34 mL/min — ABNORMAL LOW (ref >60)
GFR calc non Af Amer: 29 mL/min — ABNORMAL LOW (ref >60)
GLUCOSE: 192 mg/dL — AB (ref 65–99)
Potassium: 4 mmol/L (ref 3.5–5.1)
SODIUM: 139 mmol/L (ref 135–145)
Total Bilirubin: 0.8 mg/dL (ref 0.3–1.2)
Total Protein: 8 g/dL (ref 6.5–8.1)

## 2017-08-22 LAB — LIPASE, BLOOD: Lipase: 27 U/L (ref 11–51)

## 2017-08-22 MED ORDER — SODIUM CHLORIDE 0.9 % IV BOLUS
1000.0000 mL | Freq: Once | INTRAVENOUS | Status: AC
  Administered 2017-08-22: 1000 mL via INTRAVENOUS

## 2017-08-22 MED ORDER — ONDANSETRON HCL 4 MG/2ML IJ SOLN
4.0000 mg | Freq: Once | INTRAMUSCULAR | Status: AC
  Administered 2017-08-22: 4 mg via INTRAVENOUS
  Filled 2017-08-22: qty 2

## 2017-08-22 MED ORDER — MORPHINE SULFATE (PF) 2 MG/ML IV SOLN
2.0000 mg | Freq: Once | INTRAVENOUS | Status: AC
  Administered 2017-08-22: 2 mg via INTRAVENOUS
  Filled 2017-08-22: qty 1

## 2017-08-22 NOTE — ED Triage Notes (Signed)
Pt arrived from home via EMS with N/V/D since Monday morning at 0400. Pt has hx/o gastritis, last episode in the summer of 2018. Pt is A&O x4 on arrival. Pt reports most pain on the RLQ of abdomin.

## 2017-08-22 NOTE — ED Provider Notes (Signed)
[facility] Emergency Department Provider Note    First MD Initiated Contact with Patient 08/22/17 2316     (approximate)  I have reviewed the triage vital signs and the nursing notes.   HISTORY  Chief Complaint Abdominal Pain    HPI Jacqueline Powers is a 83 y.o. female with below list of chronic medical conditions presents to the emergency department with acute onset of right lower quadrant abdominal pain nausea vomiting and diarrhea which began at 4 AM this morning.  Patient denies any fever afebrile on presentation.   Past Medical History:  Diagnosis Date  . Arthritis   . Asthma   . Carpal tunnel syndrome   . Colonic polyp   . Degenerative joint disease of cervical and lumbar spine   . Dysrhythmia   . GERD (gastroesophageal reflux disease)   . Glaucoma   . Hashimoto's thyroiditis    a. 05/2002 s/p resection of thyroid nodule-->on replacement.  . Hiatal hernia   . History of depression   . Hyperlipidemia   . Hypothyroidism   . Irritable bowel syndrome   . Osteoporosis   . PAF (paroxysmal atrial fibrillation) (HCC)    a. CHA2DS2VASc = 5-->xarelto;    . Perennial allergic rhinitis   . Right knee meniscal tear   . TIA (transient ischemic attack)    a. 06/2013: LUE/LLE wkns x 15 mins, MRI neg for CVA.    Patient Active Problem List   Diagnosis Date Noted  . PAF (paroxysmal atrial fibrillation) (HCC)   . Encounter for anticoagulation discussion and counseling 11/10/2014  . Atrial fibrillation (HCC) 08/04/2014  . Tachycardia 06/23/2014  . TIA (transient ischemic attack) 06/23/2014  . Hashimoto's thyroiditis 06/23/2014  . Jaw pain, non-TMJ 06/23/2014  . Neck pain 06/23/2014  . Chest pain 08/17/2012  . Hyperlipidemia 08/17/2012    Past Surgical History:  Procedure Laterality Date  . BACK SURGERY     L5  . CATARACT EXTRACTION    . CERVICAL CONE BIOPSY    . CERVICAL FUSION    . COLONOSCOPY    . COLONOSCOPY WITH PROPOFOL N/A 01/20/2017    Procedure: COLONOSCOPY WITH PROPOFOL;  Surgeon: Scot Jun, MD;  Location: [facility] ENDOSCOPY;  Service: Endoscopy;  Laterality: N/A;  . ESOPHAGOGASTRODUODENOSCOPY (EGD) WITH PROPOFOL N/A 01/20/2017   Procedure: ESOPHAGOGASTRODUODENOSCOPY (EGD) WITH PROPOFOL;  Surgeon: Scot Jun, MD;  Location: [facility] ENDOSCOPY;  Service: Endoscopy;  Laterality: N/A;  . ESOPHAGOGASTRODUODENOSCOPY (EGD) WITH PROPOFOL N/A 04/28/2017   Procedure: ESOPHAGOGASTRODUODENOSCOPY (EGD) WITH PROPOFOL;  Surgeon: Scot Jun, MD;  Location: [facility] ENDOSCOPY;  Service: Endoscopy;  Laterality: N/A;    Prior to Admission medications   Medication Sig Start Date End Date Taking? Authorizing Provider  cyclobenzaprine (FLEXERIL) 5 MG tablet Take 1 tablet (5 mg total) by mouth at bedtime. 10/26/16   Willy Eddy, MD  diltiazem (CARDIZEM) 30 MG tablet Take 1 tablet (30 mg total) by mouth 3 (three) times daily as needed. Patient not taking: Reported on 04/28/2017 03/30/16   Antonieta Iba, MD  ezetimibe (ZETIA) 10 MG tablet Take 1 tablet (10 mg total) by mouth daily. 09/06/16 10/06/16  Antonieta Iba, MD  fluticasone (FLONASE) 50 MCG/ACT nasal spray instill 2 sprays into each nostril at bedtime if needed 02/17/14   [provider]  HYDROcodone-acetaminophen (NORCO) 5-325 MG tablet Take 1 tablet by mouth every 4 (four) hours as needed for moderate pain. Patient not taking: Reported on 04/28/2017 10/26/16   Willy Eddy, MD  levothyroxine (SYNTHROID, LEVOTHROID) 25 MCG tablet Take 25 mcg by mouth daily before breakfast.  06/12/14   [provider]  lidocaine (LIDODERM) 5 % Place 1 patch onto the skin every 12 (twelve) hours. Remove & Discard patch within 12 hours or as directed by MD Patient not taking: Reported on 04/28/2017 10/26/16 10/26/17  Willy Eddy, MD  LUMIGAN 0.01 % SOLN Place 1 drop into both eyes daily. 05/15/15   [provider]  metoprolol succinate (TOPROL-XL) 25 MG 24  hr tablet Take 1 tablet (25 mg total) by mouth daily. 05/02/17   Antonieta Iba, MD  rosuvastatin (CRESTOR) 5 MG tablet Take 1 tablet (5 mg total) by mouth daily at 6 PM. 09/06/16 12/05/16  Antonieta Iba, MD  XARELTO 20 MG TABS tablet TAKE 1 TABLET DAILY WITH SUPPER 09/21/16   Antonieta Iba, MD    Allergies Amoxicillin-pot clavulanate; Codeine; Other; Septra [sulfamethoxazole-trimethoprim]; Simvastatin; and Sulfa antibiotics  Family History  Problem Relation Age of Onset  . Hypertension Sister     Social History Social History   Tobacco Use  . Smoking status: Never Smoker  . Smokeless tobacco: Never Used  Substance Use Topics  . Alcohol use: Yes    Comment: none last 24hrs  . Drug use: No    Review of Systems Constitutional: No fever/chills Eyes: No visual changes. ENT: No sore throat. Cardiovascular: Denies chest pain. Respiratory: Denies shortness of breath. Gastrointestinal: Positive for abdominal pain nausea vomiting and diarrhea.   Genitourinary: Negative for dysuria. Musculoskeletal: Negative for neck pain.  Negative for back pain. Integumentary: Negative for rash. Neurological: Negative for headaches, focal weakness or numbness.   ____________________________________________   PHYSICAL EXAM:  VITAL SIGNS: ED Triage Vitals  Enc Vitals Group     BP 08/22/17 2221 111/68     Pulse Rate 08/22/17 2221 (!) 110     Resp 08/22/17 2221 18     Temp 08/22/17 2221 98 F (36.7 C)     Temp Source 08/22/17 2221 Oral     SpO2 08/22/17 2221 100 %     Weight 08/22/17 2218 54.4 kg (120 lb)     Height --      Head Circumference --      Peak Flow --      Pain Score 08/22/17 2320 4     Pain Loc --      Pain Edu? --      Excl. in GC? --     Constitutional: Alert and oriented. Well appearing and in no acute distress. Eyes: Conjunctivae are normal.  Head: Atraumatic. Mouth/Throat: Mucous membranes are very dry.. Oropharynx non-erythematous. Neck: No stridor.     Cardiovascular: Normal rate, regular rhythm. Good peripheral circulation. Grossly normal heart sounds. Respiratory: Normal respiratory effort.  No retractions. Lungs CTAB. Gastrointestinal: Right lower quadrant tenderness to palpation.  No distention.  Musculoskeletal: No lower extremity tenderness nor edema. No gross deformities of extremities. Neurologic:  Normal speech and language. No gross focal neurologic deficits are appreciated.  Skin:  Skin is warm, dry and intact. No rash noted. Psychiatric: Mood and affect are normal. Speech and behavior are normal.  ____________________________________________   LABS (all labs ordered are listed, but only abnormal results are displayed)  Labs Reviewed  COMPREHENSIVE METABOLIC PANEL - Abnormal; Notable for the following components:      Result Value   CO2 20 (*)    Glucose, Bld 192 (*)    BUN 32 (*)    Creatinine, Ser 1.60 (*)  AST 42 (*)    GFR calc non Af Amer 29 (*)    GFR calc Af Amer 34 (*)    Anion gap 17 (*)    All other components within normal limits  CBC - Abnormal; Notable for the following components:   Hemoglobin 16.2 (*)    HCT 48.9 (*)    All other components within normal limits  GASTROINTESTINAL PANEL BY PCR, STOOL (REPLACES STOOL CULTURE)  LIPASE, BLOOD  URINALYSIS, COMPLETE (UACMP) WITH MICROSCOPIC     RADIOLOGY I, Car[place] N BROWN, personally viewed and evaluated these images (plain radiographs) as part of my medical decision making, as well as reviewing the written report by the radiologist.  ED MD interpretation: Widespread fluid filled small bowel.  Liquid stool noted throughout large intestine   Official radiology report(s): Ct Abdomen Pelvis Wo Contrast  Result Date: 08/23/2017 CLINICAL DATA:  83 year old female with nausea vomiting and diarrhea for 2 days. Right lower quadrant abdominal pain. EXAM: CT ABDOMEN AND PELVIS WITHOUT CONTRAST TECHNIQUE: Multidetector CT imaging of the abdomen and pelvis  was performed following the standard protocol without IV contrast. COMPARISON:  CT Abdomen and Pelvis 03/28/2017 and earlier. FINDINGS: Lower chest: Stable mild elevation of the right hemidiaphragm. No pericardial or pleural effusion. Mild dependent lung base atelectasis. Hepatobiliary: Negative noncontrast liver. Questionable dependent sludge in the gallbladder but no pericholecystic inflammation. Pancreas: Stable, partially atrophied. Spleen: Negative. Adrenals/Urinary Tract: Normal adrenal glands. No hydronephrosis, perinephric stranding, or nephrolithiasis. The proximal ureters are decompressed. Diminutive and unremarkable urinary bladder. Stomach/Bowel: Decompressed rectosigmoid colon. Redundant sigmoid in the pelvis. Decompressed left colon which is medial to some small bowel loops in the left abdomen. There is gas and mild retained fluid in the transverse colon which is otherwise negative. There is fluid in nondilated right colon. The terminal ileum is decompressed. Normal appendix identified on series 2, image 54. There are fluid-filled but nondilated small bowel loops throughout the pelvis. Similar caliber less fluid-filled loops of jejunum in the left abdomen. Small gastric hiatal hernia, decreased compared to October. Otherwise decompressed stomach and duodenum. No abdominal free air or free fluid. Vascular/Lymphatic: Aortoiliac calcified atherosclerosis. Vascular patency is not evaluated in the absence of IV contrast. No lymphadenopathy. Reproductive: Stable and negative. Other: No pelvic free fluid. Musculoskeletal: No acute osseous abnormality identified. IMPRESSION: 1. No inflammatory process identified in the abdomen or pelvis. Normal appendix. 2. Widespread fluid-filled but nondilated small bowel loops might reflect a degree of ileus, while fluid in the large bowel to the splenic flexure might reflect diarrhea. Electronically Signed   By: Odessa Fleming M.D.   On: 08/23/2017 00:23      Procedures   ____________________________________________   INITIAL IMPRESSION / ASSESSMENT AND PLAN / ED COURSE  As part of my medical decision making, I reviewed the following data within the electronic MEDICAL RECORD NUMBER   83 year-old female presented with above-stated history and physical exam concerning for infectious diarrhea with dehydration.  Also consider possibility of appendicitis diverticulitis or other intra-abdominal pathology such CT scan of the abdomen was performed which revealed fluid-filled small bowel and liquid stool throughout the large bowel.  Patient received 2 L IV normal saline however remains tachycardic with nausea.  Patient also given a total of 8 mg of IV Zofran.  Patient discussed with Dr. Anne Hahn for hospital admission for evaluation and management of diarrheal illness with dehydration renal insufficiency most likely secondary to dehydration.     ____________________________________________  FINAL CLINICAL IMPRESSION(S) / ED DIAGNOSES  Final diagnoses:  Dehydration  Infectious diarrhea  Renal insufficiency     MEDICATIONS GIVEN DURING THIS VISIT:  Medications  sodium chloride 0.9 % bolus 1,000 mL (0 mLs Intravenous Stopped 08/23/17 0034)  morphine 2 MG/ML injection 2 mg (2 mg Intravenous Given 08/22/17 2333)  ondansetron (ZOFRAN) injection 4 mg (4 mg Intravenous Given 08/22/17 2333)     ED Discharge Orders    None       Note:  This document was prepared using Dragon voice recognition software and may include unintentional dictation errors.    Darci CurrentBrown, [place] N, MD 08/23/17 [id number]

## 2017-08-23 ENCOUNTER — Other Ambulatory Visit: Payer: Self-pay

## 2017-08-23 DIAGNOSIS — N179 Acute kidney failure, unspecified: Secondary | ICD-10-CM | POA: Diagnosis present

## 2017-08-23 LAB — URINALYSIS, COMPLETE (UACMP) WITH MICROSCOPIC
Bacteria, UA: NONE SEEN
Bilirubin Urine: NEGATIVE
Glucose, UA: NEGATIVE mg/dL
Hgb urine dipstick: NEGATIVE
Ketones, ur: NEGATIVE mg/dL
Leukocytes, UA: NEGATIVE
Nitrite: NEGATIVE
Protein, ur: NEGATIVE mg/dL
Specific Gravity, Urine: 1.016 (ref 1.005–1.030)
pH: 5 (ref 5.0–8.0)

## 2017-08-23 LAB — BASIC METABOLIC PANEL
ANION GAP: 10 (ref 5–15)
BUN: 31 mg/dL — ABNORMAL HIGH (ref 6–20)
CALCIUM: 7.7 mg/dL — AB (ref 8.9–10.3)
CHLORIDE: 110 mmol/L (ref 101–111)
CO2: 21 mmol/L — AB (ref 22–32)
Creatinine, Ser: 1.1 mg/dL — ABNORMAL HIGH (ref 0.44–1.00)
GFR calc non Af Amer: 46 mL/min — ABNORMAL LOW (ref >60)
GFR, EST AFRICAN AMERICAN: 53 mL/min — AB (ref >60)
GLUCOSE: 116 mg/dL — AB (ref 65–99)
POTASSIUM: 4.3 mmol/L (ref 3.5–5.1)
Sodium: 141 mmol/L (ref 135–145)

## 2017-08-23 LAB — TSH: TSH: 0.365 u[IU]/mL (ref 0.350–4.500)

## 2017-08-23 MED ORDER — ONDANSETRON HCL 4 MG PO TABS
4.0000 mg | ORAL_TABLET | Freq: Four times a day (QID) | ORAL | Status: DC | PRN
  Administered 2017-08-23: 4 mg via ORAL
  Filled 2017-08-23: qty 1

## 2017-08-23 MED ORDER — SODIUM CHLORIDE 0.9 % IV SOLN
Freq: Once | INTRAVENOUS | Status: AC
  Administered 2017-08-23: 02:00:00 via INTRAVENOUS

## 2017-08-23 MED ORDER — ACETAMINOPHEN 325 MG PO TABS
650.0000 mg | ORAL_TABLET | Freq: Four times a day (QID) | ORAL | Status: DC | PRN
  Administered 2017-08-23 – 2017-08-24 (×2): 650 mg via ORAL
  Filled 2017-08-23 (×2): qty 2

## 2017-08-23 MED ORDER — MORPHINE SULFATE (PF) 2 MG/ML IV SOLN
2.0000 mg | Freq: Once | INTRAVENOUS | Status: AC
  Administered 2017-08-23: 2 mg via INTRAVENOUS

## 2017-08-23 MED ORDER — LATANOPROST 0.005 % OP SOLN
1.0000 [drp] | Freq: Every day | OPHTHALMIC | Status: DC
  Administered 2017-08-23: 1 [drp] via OPHTHALMIC
  Filled 2017-08-23: qty 2.5

## 2017-08-23 MED ORDER — ALUM & MAG HYDROXIDE-SIMETH 200-200-20 MG/5ML PO SUSP
15.0000 mL | Freq: Four times a day (QID) | ORAL | Status: DC | PRN
  Administered 2017-08-23: 20:00:00 15 mL via ORAL
  Filled 2017-08-23: qty 30

## 2017-08-23 MED ORDER — HYDROXYZINE HCL 25 MG PO TABS
25.0000 mg | ORAL_TABLET | Freq: Every evening | ORAL | Status: DC | PRN
  Filled 2017-08-23: qty 1

## 2017-08-23 MED ORDER — LEVOTHYROXINE SODIUM 25 MCG PO TABS
25.0000 ug | ORAL_TABLET | Freq: Every day | ORAL | Status: DC
  Administered 2017-08-24: 06:00:00 25 ug via ORAL
  Filled 2017-08-23 (×2): qty 1

## 2017-08-23 MED ORDER — RIVAROXABAN 15 MG PO TABS
15.0000 mg | ORAL_TABLET | Freq: Every day | ORAL | Status: DC
  Administered 2017-08-23: 15 mg via ORAL
  Filled 2017-08-23 (×2): qty 1

## 2017-08-23 MED ORDER — ACETAMINOPHEN 650 MG RE SUPP: 650.0000 mg | Freq: Four times a day (QID) | RECTAL | Status: DC | PRN

## 2017-08-23 MED ORDER — ONDANSETRON HCL 4 MG/2ML IJ SOLN: 4.0000 mg | Freq: Four times a day (QID) | INTRAMUSCULAR | Status: DC | PRN

## 2017-08-23 MED ORDER — DOCUSATE SODIUM 100 MG PO CAPS
100.0000 mg | ORAL_CAPSULE | Freq: Two times a day (BID) | ORAL | Status: DC
  Filled 2017-08-23: qty 1

## 2017-08-23 MED ORDER — METOPROLOL SUCCINATE ER 25 MG PO TB24
25.0000 mg | ORAL_TABLET | Freq: Every day | ORAL | Status: DC
Start: 2017-08-23 — End: 2017-08-24
  Administered 2017-08-23 – 2017-08-24 (×2): 25 mg via ORAL
  Filled 2017-08-23 (×2): qty 1

## 2017-08-23 MED ORDER — FLUTICASONE PROPIONATE 50 MCG/ACT NA SUSP
2.0000 | Freq: Every evening | NASAL | Status: DC | PRN
  Filled 2017-08-23: qty 16

## 2017-08-23 MED ORDER — DORZOLAMIDE HCL-TIMOLOL MAL 2-0.5 % OP SOLN
1.0000 [drp] | Freq: Two times a day (BID) | OPHTHALMIC | Status: DC
  Administered 2017-08-23 – 2017-08-24 (×3): 1 [drp] via OPHTHALMIC
  Filled 2017-08-23: qty 10

## 2017-08-23 MED ORDER — PANTOPRAZOLE SODIUM 40 MG PO TBEC
40.0000 mg | DELAYED_RELEASE_TABLET | Freq: Every day | ORAL | Status: DC
Start: 2017-08-23 — End: 2017-08-24
  Administered 2017-08-24: 06:00:00 40 mg via ORAL
  Filled 2017-08-23 (×2): qty 1

## 2017-08-23 MED ORDER — EZETIMIBE 10 MG PO TABS
10.0000 mg | ORAL_TABLET | Freq: Every day | ORAL | Status: DC
  Filled 2017-08-23: qty 1

## 2017-08-23 MED ORDER — MORPHINE SULFATE (PF) 2 MG/ML IV SOLN
INTRAVENOUS | Status: AC
  Filled 2017-08-23: qty 1

## 2017-08-23 MED ORDER — CITALOPRAM HYDROBROMIDE 20 MG PO TABS
10.0000 mg | ORAL_TABLET | Freq: Every day | ORAL | Status: DC
  Filled 2017-08-23: qty 1

## 2017-08-23 MED ORDER — SODIUM CHLORIDE 0.9 % IV SOLN
INTRAVENOUS | Status: DC
  Administered 2017-08-23 – 2017-08-24 (×3): via INTRAVENOUS

## 2017-08-23 MED ORDER — ROSUVASTATIN CALCIUM 10 MG PO TABS: 5.0000 mg | ORAL_TABLET | Freq: Every day | ORAL | Status: DC

## 2017-08-23 NOTE — ED Notes (Signed)
Patient transport to 117

## 2017-08-23 NOTE — ED Notes (Signed)
Patient is resting comfortably at this time with no signs of distress present. Equal, unlabored rise and fall of chest noted within normal rate. VS stable. Will continue to monitor.   

## 2017-08-23 NOTE — ED Notes (Signed)
ED Provider at bedside. 

## 2017-08-23 NOTE — ED Notes (Signed)
Patient transported to CT 

## 2017-08-23 NOTE — Progress Notes (Signed)
Patient admitted w/ abdominal pain w/ AKI Scr 1.60 (BL 0.8 - 0.9) Patient takes xarelto 20 mg daily w/ supper for afib. Considering patient is in AKI spoke to MD to recommend decreasing dose to 15 mg daily w/ supper for CrCl 15 - 50 ml/min.  MD notified and agrees with plan.  Thomasene Rippleavid Atreus Hasz, PharmD, BCPS Clinical Pharmacist 08/23/2017

## 2017-08-23 NOTE — H&P (Signed)
Jacqueline Powers is an 83 y.o. female.   Chief Complaint: Abdominal pain HPI: Patient with past medical history of asthma, hypothyroidism, paroxysmal atrial fibrillation and irritable bowel syndrome presents to the emergency department complaining of abdominal pain.  Patient states that is different than her usual IBS.  She is also had diarrhea for the last 2 days.  She denies fever but admits to nausea and vomiting.  Emesis has been nonbloody nonbilious.  CT of her abdomen showed possible transition point indicative of ileus but also fluid within the colon compatible with infectious diarrhea.  Due to the above findings and ongoing symptoms the emergency department staff called the hospitalist service for admission.  Past Medical History:  Diagnosis Date  . Arthritis   . Asthma   . Carpal tunnel syndrome   . Colonic polyp   . Degenerative joint disease of cervical and lumbar spine   . Dysrhythmia   . GERD (gastroesophageal reflux disease)   . Glaucoma   . Hashimoto's thyroiditis    a. 05/2002 s/p resection of thyroid nodule-->on replacement.  . Hiatal hernia   . History of depression   . Hyperlipidemia   . Hypothyroidism   . Irritable bowel syndrome   . Osteoporosis   . PAF (paroxysmal atrial fibrillation) (HCC)    a. CHA2DS2VASc = 5-->xarelto;    . Perennial allergic rhinitis   . Right knee meniscal tear   . TIA (transient ischemic attack)    a. 06/2013: LUE/LLE wkns x 15 mins, MRI neg for CVA.    Past Surgical History:  Procedure Laterality Date  . BACK SURGERY     L5  . CATARACT EXTRACTION    . CERVICAL CONE BIOPSY    . CERVICAL FUSION    . COLONOSCOPY    . COLONOSCOPY WITH PROPOFOL N/A 01/20/2017   Procedure: COLONOSCOPY WITH PROPOFOL;  Surgeon: Manya Silvas, MD;  Location: [facility] ENDOSCOPY;  Service: Endoscopy;  Laterality: N/A;  . ESOPHAGOGASTRODUODENOSCOPY (EGD) WITH PROPOFOL N/A 01/20/2017   Procedure: ESOPHAGOGASTRODUODENOSCOPY (EGD) WITH PROPOFOL;  Surgeon: Manya Silvas, MD;  Location: [facility] ENDOSCOPY;  Service: Endoscopy;  Laterality: N/A;  . ESOPHAGOGASTRODUODENOSCOPY (EGD) WITH PROPOFOL N/A 04/28/2017   Procedure: ESOPHAGOGASTRODUODENOSCOPY (EGD) WITH PROPOFOL;  Surgeon: Manya Silvas, MD;  Location: [facility] ENDOSCOPY;  Service: Endoscopy;  Laterality: N/A;    Family History  Problem Relation Age of Onset  . Hypertension Sister    Social History:  reports that she has never smoked. She has never used smokeless tobacco. She reports that she drinks alcohol. She reports that she does not use drugs.  Allergies:  Allergies  Allergen Reactions  . Amoxicillin-Pot Clavulanate Nausea And Vomiting    Has patient had a PCN reaction causing immediate rash, facial/tongue/throat swelling, SOB or lightheadedness with hypotension: No Has patient had a PCN reaction causing severe rash involving mucus membranes or skin necrosis: No Has patient had a PCN reaction that required hospitalization: No Has patient had a PCN reaction occurring within the last 10 years: Yes  If all of the above answers are "NO", then may proceed with Cephalosporin use.   . Codeine Nausea And Vomiting  . Other Other (See Comments)    Other reaction(s): Unknown GLAUCOMA DROPS  . Septra [Sulfamethoxazole-Trimethoprim] Other (See Comments)    Reaction: unknown  . Simvastatin Other (See Comments)    "did something to muscles"  . Sulfa Antibiotics Other (See Comments)    Reaction: unknown    Medications Prior to Admission  Medication Sig  Dispense Refill  . citalopram (CELEXA) 10 MG tablet Take 10 mg by mouth daily.  2  . dorzolamide-timolol (COSOPT) 22.3-6.8 MG/ML ophthalmic solution Place 1 drop into the right eye 2 (two) times daily.    Marland Kitchen ezetimibe (ZETIA) 10 MG tablet Take 10 mg by mouth daily.    . fluticasone (FLONASE) 50 MCG/ACT nasal spray Place 2 sprays into both nostrils at bedtime as needed for allergies.     . hydrOXYzine (ATARAX/VISTARIL) 25 MG tablet Take 25 mg by  mouth at bedtime as needed for sleep.    Marland Kitchen levothyroxine (SYNTHROID, LEVOTHROID) 25 MCG tablet Take 25 mcg by mouth daily before breakfast.     . LUMIGAN 0.01 % SOLN Place 1 drop into both eyes at bedtime.     . metoprolol succinate (TOPROL-XL) 25 MG 24 hr tablet Take 1 tablet (25 mg total) by mouth daily. 90 tablet 3  . pantoprazole (PROTONIX) 40 MG tablet Take 40 mg by mouth daily.    . rosuvastatin (CRESTOR) 5 MG tablet Take 5 mg by mouth daily at 6 PM.    . XARELTO 20 MG TABS tablet TAKE 1 TABLET DAILY WITH SUPPER 90 tablet 3  . cyclobenzaprine (FLEXERIL) 5 MG tablet Take 1 tablet (5 mg total) by mouth at bedtime. (Patient not taking: Reported on 08/23/2017) 12 tablet 0  . diltiazem (CARDIZEM) 30 MG tablet Take 1 tablet (30 mg total) by mouth 3 (three) times daily as needed. (Patient not taking: Reported on 04/28/2017) 90 tablet 4  . ezetimibe (ZETIA) 10 MG tablet Take 1 tablet (10 mg total) by mouth daily. 90 tablet 3  . HYDROcodone-acetaminophen (NORCO) 5-325 MG tablet Take 1 tablet by mouth every 4 (four) hours as needed for moderate pain. (Patient not taking: Reported on 04/28/2017) 6 tablet 0  . lidocaine (LIDODERM) 5 % Place 1 patch onto the skin every 12 (twelve) hours. Remove & Discard patch within 12 hours or as directed by MD (Patient not taking: Reported on 04/28/2017) 10 patch 0  . rosuvastatin (CRESTOR) 5 MG tablet Take 1 tablet (5 mg total) by mouth daily at 6 PM. 90 tablet 3    Results for orders placed or performed during the hospital encounter of 08/22/17 (from the past 48 hour(s))  Lipase, blood     Status: None   Collection Time: 08/22/17 10:20 PM  Result Value Ref Range   Lipase 27 11 - 51 U/L    Comment: Performed at [facility], [address] [place], [place] [address]  Comprehensive metabolic panel     Status: Abnormal   Collection Time: 08/22/17 10:20 PM  Result Value Ref Range   Sodium 139 135 - 145 mmol/L   Potassium 4.0 3.5 - 5.1 mmol/L   Chloride  102 101 - 111 mmol/L   CO2 20 (L) 22 - 32 mmol/L   Glucose, Bld 192 (H) 65 - 99 mg/dL   BUN 32 (H) 6 - 20 mg/dL   Creatinine, Ser 1.60 (H) 0.44 - 1.00 mg/dL   Calcium 9.0 8.9 - 10.3 mg/dL   Total Protein 8.0 6.5 - 8.1 g/dL   Albumin 4.5 3.5 - 5.0 g/dL   AST 42 (H) 15 - 41 U/L   ALT 21 14 - 54 U/L   Alkaline Phosphatase 73 38 - 126 U/L   Total Bilirubin 0.8 0.3 - 1.2 mg/dL   GFR calc non Af Amer 29 (L) >60 mL/min   GFR calc Af Amer 34 (L) >60 mL/min  Comment: (NOTE) The eGFR has been calculated using the CKD EPI equation. This calculation has not been validated in all clinical situations. eGFR's persistently <60 mL/min signify possible Chronic Kidney Disease.    Anion gap 17 (H) 5 - 15    Comment: Performed at [facility], [address] [place], [place] [address]  CBC     Status: Abnormal   Collection Time: 08/22/17 10:20 PM  Result Value Ref Range   WBC 9.8 3.6 - 11.0 K/uL   RBC 5.20 3.80 - 5.20 MIL/uL   Hemoglobin 16.2 (H) 12.0 - 16.0 g/dL   HCT 48.9 (H) 35.0 - 47.0 %   MCV 93.9 80.0 - 100.0 fL   MCH 31.1 26.0 - 34.0 pg   MCHC 33.1 32.0 - 36.0 g/dL   RDW 13.9 11.5 - 14.5 %   Platelets 215 150 - 440 K/uL    Comment: Performed at [facility], [address] [place],  [address]   Ct Abdomen Pelvis Wo Contrast  Result Date: 08/23/2017 CLINICAL DATA:  82-year-old female with nausea vomiting and diarrhea for 2 days. Right lower quadrant abdominal pain. EXAM: CT ABDOMEN AND PELVIS WITHOUT CONTRAST TECHNIQUE: Multidetector CT imaging of the abdomen and pelvis was performed following the standard protocol without IV contrast. COMPARISON:  CT Abdomen and Pelvis 03/28/2017 and earlier. FINDINGS: Lower chest: Stable mild elevation of the right hemidiaphragm. No pericardial or pleural effusion. Mild dependent lung base atelectasis. Hepatobiliary: Negative noncontrast liver. Questionable dependent sludge in the gallbladder but no pericholecystic  inflammation. Pancreas: Stable, partially atrophied. Spleen: Negative. Adrenals/Urinary Tract: Normal adrenal glands. No hydronephrosis, perinephric stranding, or nephrolithiasis. The proximal ureters are decompressed. Diminutive and unremarkable urinary bladder. Stomach/Bowel: Decompressed rectosigmoid colon. Redundant sigmoid in the pelvis. Decompressed left colon which is medial to some small bowel loops in the left abdomen. There is gas and mild retained fluid in the transverse colon which is otherwise negative. There is fluid in nondilated right colon. The terminal ileum is decompressed. Normal appendix identified on series 2, image 54. There are fluid-filled but nondilated small bowel loops throughout the pelvis. Similar caliber less fluid-filled loops of jejunum in the left abdomen. Small gastric hiatal hernia, decreased compared to October. Otherwise decompressed stomach and duodenum. No abdominal free air or free fluid. Vascular/Lymphatic: Aortoiliac calcified atherosclerosis. Vascular patency is not evaluated in the absence of IV contrast. No lymphadenopathy. Reproductive: Stable and negative. Other: No pelvic free fluid. Musculoskeletal: No acute osseous abnormality identified. IMPRESSION: 1. No inflammatory process identified in the abdomen or pelvis. Normal appendix. 2. Widespread fluid-filled but nondilated small bowel loops might reflect a degree of ileus, while fluid in the large bowel to the splenic flexure might reflect diarrhea. Electronically Signed   By: H  Hall M.D.   On: 08/23/2017 00:23    Review of Systems  Constitutional: Negative for chills and fever.  HENT: Negative for sore throat and tinnitus.   Eyes: Negative for blurred vision and redness.  Respiratory: Negative for cough and shortness of breath.   Cardiovascular: Negative for chest pain, palpitations, orthopnea and PND.  Gastrointestinal: Negative for abdominal pain, diarrhea, nausea and vomiting.  Genitourinary: Negative  for dysuria, frequency and urgency.  Musculoskeletal: Negative for joint pain and myalgias.  Skin: Negative for rash.       No lesions  Neurological: Negative for speech change, focal weakness and weakness.  Endo/Heme/Allergies: Does not bruise/bleed easily.       No temperature intolerance  Psychiatric/Behavioral: Negative for depression and   suicidal ideas.    Blood pressure (!) 106/58, pulse 78, temperature 98 F (36.7 C), temperature source Oral, resp. rate 16, height 5' 3" (1.6 m), weight 53.5 kg (118 lb), SpO2 95 %. Physical Exam  Vitals reviewed. Constitutional: She is oriented to person, place, and time. She appears well-developed and well-nourished. No distress.  HENT:  Head: Normocephalic and atraumatic.  Mouth/Throat: Oropharynx is clear and moist.  Eyes: Pupils are equal, round, and reactive to light. Conjunctivae and EOM are normal. No scleral icterus.  Neck: Normal range of motion. Neck supple. No JVD present. No tracheal deviation present. No thyromegaly present.  Cardiovascular: Normal rate, regular rhythm and normal heart sounds. Exam reveals no gallop and no friction rub.  No murmur heard. Respiratory: Effort normal and breath sounds normal.  GI: Soft. Bowel sounds are normal. She exhibits no distension and no mass. There is tenderness. There is no rebound and no guarding.  Genitourinary:  Genitourinary Comments: Deferred  Musculoskeletal: Normal range of motion. She exhibits no edema.  Lymphadenopathy:    She has no cervical adenopathy.  Neurological: She is alert and oriented to person, place, and time. No cranial nerve deficit. She exhibits normal muscle tone.  Skin: Skin is warm and dry. No rash noted. No erythema.  Psychiatric: She has a normal mood and affect. Her behavior is normal. Judgment and thought content normal.     Assessment/Plan This is an 82-year-old female admitted for acute kidney injury. 1.  Acute kidney injury: Secondary to dehydration due to  diarrhea.  Hydrate with intravenous fluid.  Avoid nephrotoxic agents 2.  Gastroenteritis: GI panel pending.  Supportive care.   3.  Ileus: Bowel rest; clear liquid diet.  Advance tolerated. 4.  Hypothyroidism: Check TSH; continue Synthroid 5.  Atrial fibrillation: Paroxysmal; rate controlled.  Continue metoprolol. Xarelto for anticoagulation 6.  Hyperlipidemia: Continue statin therapy as well as Zetia 7.  Depression: Stable; continue Celexa 8.  DVT prophylaxis: Full dose anti-correlation as above 9.  GI prophylaxis: None The patient is a full code.  Time spent on admission orders and patient care approximately 45 minutes    Diamond,  Michael S, MD 08/23/2017, 5:56 AM   

## 2017-08-24 LAB — BASIC METABOLIC PANEL
Anion gap: 8 (ref 5–15)
BUN: 21 mg/dL — ABNORMAL HIGH (ref 6–20)
CO2: 20 mmol/L — AB (ref 22–32)
CREATININE: 0.77 mg/dL (ref 0.44–1.00)
Calcium: 7.9 mg/dL — ABNORMAL LOW (ref 8.9–10.3)
Chloride: 111 mmol/L (ref 101–111)
GFR calc non Af Amer: >60 mL/min (ref >60)
Glucose, Bld: 99 mg/dL (ref 65–99)
Potassium: 3.6 mmol/L (ref 3.5–5.1)
Sodium: 139 mmol/L (ref 135–145)

## 2017-08-24 MED ORDER — LOPERAMIDE HCL 2 MG PO TABS: 2.0000 mg | ORAL_TABLET | Freq: Four times a day (QID) | ORAL | 0 refills | Status: DC | PRN

## 2017-08-24 NOTE — Care Management CC44 (Signed)
Condition Code 44 Documentation Completed  Patient Details  Name: Jacqueline Powers MRN: [id number] Date of Birth: 08/25/1934   Condition Code 44 given:  Yes Patient signature on Condition Code 44 notice:  Yes Documentation of 2 MD's agreement:  Yes Code 44 added to claim:  Yes    Jacqueline MemosLisa M Dael Howland, RN 08/24/2017, 3:37 PM

## 2017-08-24 NOTE — Plan of Care (Signed)

## 2017-08-24 NOTE — Care Management Obs Status (Signed)
MEDICARE OBSERVATION STATUS NOTIFICATION   Patient Details  Name: Jacqueline Powers MRN: [id number] Date of Birth: 05/02/1935   Medicare Observation Status Notification Given:  Yes    Jacqueline MemosLisa M Buena Boehm, RN 08/24/2017, 3:37 PM

## 2017-08-28 ENCOUNTER — Other Ambulatory Visit
Admission: RE | Admit: 2017-08-28 | Discharge: 2017-08-28 | Disposition: A | Discharge status: Home / Self Care | Payer: Medicare Other | Source: Ambulatory Visit | Attending: Student | Admitting: Student

## 2017-08-28 DIAGNOSIS — K529 Noninfective gastroenteritis and colitis, unspecified: Secondary | ICD-10-CM | POA: Insufficient documentation

## 2017-08-28 LAB — GASTROINTESTINAL PANEL BY PCR, STOOL (REPLACES STOOL CULTURE)
Adenovirus F40/41: NOT DETECTED
Astrovirus: NOT DETECTED
CRYPTOSPORIDIUM: NOT DETECTED
Campylobacter species: NOT DETECTED
Cyclospora cayetanensis: NOT DETECTED
ENTAMOEBA HISTOLYTICA: NOT DETECTED
ENTEROAGGREGATIVE E COLI (EAEC): NOT DETECTED
Enteropathogenic E coli (EPEC): NOT DETECTED
Enterotoxigenic E coli (ETEC): NOT DETECTED
GIARDIA LAMBLIA: NOT DETECTED
NOROVIRUS GI/GII: DETECTED — AB
Plesimonas shigelloides: NOT DETECTED
Rotavirus A: NOT DETECTED
SALMONELLA SPECIES: NOT DETECTED
SHIGELLA/ENTEROINVASIVE E COLI (EIEC): NOT DETECTED
Sapovirus (I, II, IV, and V): NOT DETECTED
Shiga like toxin producing E coli (STEC): NOT DETECTED
VIBRIO CHOLERAE: NOT DETECTED
Vibrio species: NOT DETECTED
YERSINIA ENTEROCOLITICA: NOT DETECTED

## 2017-09-09 NOTE — Discharge Summary (Signed)
[facility] [place] [facility]   PATIENT NAME: Jacqueline Powers    MR#:  [id number]  DATE OF BIRTH:  [birth year]  DATE OF ADMISSION:  08/22/2017 ADMITTING PHYSICIAN: Arnaldo Natal, MD  DATE OF DISCHARGE: 08/24/2017  5:23 PM  PRIMARY CARE PHYSICIAN: Margaretann Loveless, MD   ADMISSION DIAGNOSIS:  Dehydration [E86.0] Renal insufficiency [N28.9] Infectious diarrhea [A09]  DISCHARGE DIAGNOSIS:  Active Problems:   AKI (acute kidney injury) (HCC)   SECONDARY DIAGNOSIS:   Past Medical History:  Diagnosis Date  . Arthritis   . Asthma   . Carpal tunnel syndrome   . Colonic polyp   . Degenerative joint disease of cervical and lumbar spine   . Dysrhythmia   . GERD (gastroesophageal reflux disease)   . Glaucoma   . Hashimoto's thyroiditis    a. 05/2002 s/p resection of thyroid nodule-->on replacement.  . Hiatal hernia   . History of depression   . Hyperlipidemia   . Hypothyroidism   . Irritable bowel syndrome   . Osteoporosis   . PAF (paroxysmal atrial fibrillation) (HCC)    a. CHA2DS2VASc = 5-->xarelto;    . Perennial allergic rhinitis   . Right knee meniscal tear   . TIA (transient ischemic attack)    a. 06/2013: LUE/LLE wkns x 15 mins, MRI neg for CVA.     ADMITTING HISTORY  HPI: Patient with past medical history of asthma, hypothyroidism, paroxysmal atrial fibrillation and irritable bowel syndrome presents to the emergency department complaining of abdominal pain.  Patient states that is different than her usual IBS.  She is also had diarrhea for the last 2 days.  She denies fever but admits to nausea and vomiting.  Emesis has been nonbloody nonbilious.  CT of her abdomen showed possible transition point indicative of ileus but also fluid within the colon compatible with infectious diarrhea.  Due to the above findings and ongoing symptoms the emergency department staff called the hospitalist service for admission.  HOSPITAL COURSE:   * AKI Due to  dehydration from diarrhea.  Started on IV fluids and resolved.  *Diarrhea. No further diarrhea in the hospital.  CT raised concern for ileus but patient did not have any vomiting or abdominal pain.  Normal bowel movement.  Resolved.  Patient felt better by the day of discharge and has tolerated diet and has requested to be discharged home.  Discharge home in stable condition.   CONSULTS OBTAINED:    DRUG ALLERGIES:   Allergies  Allergen Reactions  . Amoxicillin-Pot Clavulanate Nausea And Vomiting    Has patient had a PCN reaction causing immediate rash, facial/tongue/throat swelling, SOB or lightheadedness with hypotension: No Has patient had a PCN reaction causing severe rash involving mucus membranes or skin necrosis: No Has patient had a PCN reaction that required hospitalization: No Has patient had a PCN reaction occurring within the last 10 years: Yes  If all of the above answers are "NO", then may proceed with Cephalosporin use.   . Codeine Nausea And Vomiting  . Other Other (See Comments)    Other reaction(s): Unknown GLAUCOMA DROPS  . Septra [Sulfamethoxazole-Trimethoprim] Other (See Comments)    Reaction: unknown  . Simvastatin Other (See Comments)    "did something to muscles"  . Sulfa Antibiotics Other (See Comments)    Reaction: unknown    DISCHARGE MEDICATIONS:   Allergies as of 08/24/2017      Reactions   Amoxicillin-pot Clavulanate Nausea And Vomiting   Has patient had a  PCN reaction causing immediate rash, facial/tongue/throat swelling, SOB or lightheadedness with hypotension: No Has patient had a PCN reaction causing severe rash involving mucus membranes or skin necrosis: No Has patient had a PCN reaction that required hospitalization: No Has patient had a PCN reaction occurring within the last 10 years: Yes  If all of the above answers are "NO", then may proceed with Cephalosporin use.   Codeine Nausea And Vomiting   Other Other (See Comments)   Other  reaction(s): Unknown GLAUCOMA DROPS   Septra [sulfamethoxazole-trimethoprim] Other (See Comments)   Reaction: unknown   Simvastatin Other (See Comments)   "did something to muscles"   Sulfa Antibiotics Other (See Comments)   Reaction: unknown      Medication List    STOP taking these medications   citalopram 10 MG tablet Commonly known as:  CELEXA   cyclobenzaprine 5 MG tablet Commonly known as:  FLEXERIL   diltiazem 30 MG tablet Commonly known as:  CARDIZEM   ezetimibe 10 MG tablet Commonly known as:  ZETIA   HYDROcodone-acetaminophen 5-325 MG tablet Commonly known as:  NORCO   lidocaine 5 % Commonly known as:  LIDODERM     TAKE these medications   dorzolamide-timolol 22.3-6.8 MG/ML ophthalmic solution Commonly known as:  COSOPT Place 1 drop into the right eye 2 (two) times daily.   fluticasone 50 MCG/ACT nasal spray Commonly known as:  FLONASE Place 2 sprays into both nostrils at bedtime as needed for allergies.   hydrOXYzine 25 MG tablet Commonly known as:  ATARAX/VISTARIL Take 25 mg by mouth at bedtime as needed for sleep.   levothyroxine 25 MCG tablet Commonly known as:  SYNTHROID, LEVOTHROID Take 25 mcg by mouth daily before breakfast.   loperamide 2 MG tablet Commonly known as:  IMODIUM A-D Take 1 tablet (2 mg total) by mouth 4 (four) times daily as needed for diarrhea or loose stools.   LUMIGAN 0.01 % Soln Generic drug:  bimatoprost Place 1 drop into both eyes at bedtime.   metoprolol succinate 25 MG 24 hr tablet Commonly known as:  TOPROL-XL Take 1 tablet (25 mg total) by mouth daily.   pantoprazole 40 MG tablet Commonly known as:  PROTONIX Take 40 mg by mouth daily.   rosuvastatin 5 MG tablet Commonly known as:  CRESTOR Take 5 mg by mouth daily at 6 PM. What changed:  Another medication with the same name was removed. Continue taking this medication, and follow the directions you see here.   XARELTO 20 MG Tabs tablet Generic drug:   rivaroxaban TAKE 1 TABLET DAILY WITH SUPPER       Today   VITAL SIGNS:  Blood pressure 121/72, pulse 81, temperature 98.8 F (37.1 C), temperature source Oral, resp. rate 16, height 5\' 3"  (1.6 m), weight 61.9 kg (136 lb 7.4 oz), SpO2 96 %.  I/O:  No intake or output data in the 24 hours ending 09/09/17 1519  PHYSICAL EXAMINATION:  Physical Exam  GENERAL:  83 y.o.-year-old patient lying in the bed with no acute distress.  LUNGS: Normal breath sounds bilaterally, no wheezing, rales,rhonchi or crepitation. No use of accessory muscles of respiration.  CARDIOVASCULAR: S1, S2 normal. No murmurs, rubs, or gallops.  ABDOMEN: Soft, non-tender, non-distended. Bowel sounds present. No organomegaly or mass.  NEUROLOGIC: Moves all 4 extremities. PSYCHIATRIC: The patient is alert and oriented x 3.  SKIN: No obvious rash, lesion, or ulcer.   DATA REVIEW:   CBC No results for input(s): WBC, HGB, HCT,  PLT in the last 168 hours.  Chemistries  No results for input(s): NA, K, CL, CO2, GLUCOSE, BUN, CREATININE, CALCIUM, MG, AST, ALT, ALKPHOS, BILITOT in the last 168 hours.  Invalid input(s): GFRCGP  Cardiac Enzymes No results for input(s): TROPONINI in the last 168 hours.  Microbiology Results  No results found for this or any previous visit.  RADIOLOGY:  No results found.  Follow up with PCP in 1 week.  Management plans discussed with the patient, family and they are in agreement.  CODE STATUS:  Code Status History    Date Active Date Inactive Code Status Order ID Comments User Context   08/23/2017 0403 08/24/2017 2028 Full Code [id number]  Arnaldo Natal, MD Inpatient      TOTAL TIME TAKING CARE OF THIS PATIENT ON DAY OF DISCHARGE: more than 30 minutes.   Orie Fisherman M.D on 09/09/2017 at 3:19 PM  Between 7am to 6pm - Pager - [phone]  After 6pm go to [email or web address] - password [email or web address] [facility]  [facility]  Office  [phone]  CC: Primary care  physician; Margaretann Loveless, MD  Note: This dictation was prepared with Dragon dictation along with smaller phrase technology. Any transcriptional errors that result from this process are unintentional.

## 2017-09-15 ENCOUNTER — Other Ambulatory Visit: Payer: Self-pay | Admitting: Cardiovascular Disease

## 2017-09-18 NOTE — Telephone Encounter (Signed)
Please review for refill, Thanks !  

## 2017-09-18 NOTE — Telephone Encounter (Signed)
Refill Request.  

## 2017-09-21 ENCOUNTER — Other Ambulatory Visit: Payer: Self-pay | Admitting: Cardiovascular Disease

## 2017-10-08 NOTE — Progress Notes (Signed)
Cardiology Office Note  Date:  10/09/2017   ID:  Oma, Marzan [birth year], MRN [id number]  PCP:  Margaretann Loveless, MD   Chief Complaint  Patient presents with  . OTHER    12 month f/u no complaints today. Meds reviewed verbally with pt.    HPI:  Ms. Dominy is a pleasant 83 year old woman with a history of  hyperlipidemia,  Hashimoto's thyroiditis with elevated TSH, osteoporosis,  remote history of chest pain,  GERD,  previously evaluated for jaw pain, neck pain, tachycardia CT coronary calcium score (score of 17) history of neck  surgery, fusion  30 day monitor documented atrial fibrillation.  She presents today for follow-up of her atrial fibrillation   Difficult several months, Reports that she had a severe fall, fractured some teeth and broke her right arm  ER norovirus, 08/22/2017 Hospital records reviewed with the patient in detail Dehydrated, CR 1.6  Denies any recent episodes of atrial fibrillation She does have diltiazem to take as needed but has not had to take this  EKG personally reviewed by myself on todays visit Shows normal sinus rhythm rate 67 bpm no significant ST or T-wave changes  Other past medical history reviewed visit in the emergency room 03/26/16: atrial fib rate 150 bpm She was sleeping when she was woken up by severe tachycardia EKG showing atrial fibrillation, converted to NSR w/o intervention  PreviousCT scan imaging reviewed with her showing mild coronary calcification, mild descending aortic calcification   Last episode of atrial fibrillation 03/26/2016  Prior to that had episode in August 2017  lasting 5-10 minutes Typically episodes are rare, do not last very long  Has not been taking her lipitor, feels it is affecting her thinking  30 day monitor shows atrial fibrillation on February 10 lasting for several hours, 9:30 at night until at least 1 AM on 07/10/2014   TIA February 2015.  She had left upper extremity, left lower  extremity weakness that resolved in 15 minutes CT scan and MRI did not document stroke. Carotid ultrasound showed mild disease on one side, minimal on the other She was started on aspirin and discharged home. No further episodes of TIA or stroke over the past year.  Previous episodes of tachycardia, jaw pain, neck pain lasting 45 minutes up to one hour. Repeat episode 06/05/2014 with heart rate up to 140 bpm. Again some jaw pain. She reports having an episode in the past where she had jaw pain, heart rate up to 100.   PMH:   has a past medical history of Arthritis, Asthma, Carpal tunnel syndrome, Colonic polyp, Degenerative joint disease of cervical and lumbar spine, Dysrhythmia, GERD (gastroesophageal reflux disease), Glaucoma, Hashimoto's thyroiditis, Hiatal hernia, History of depression, Hyperlipidemia, Hypothyroidism, Irritable bowel syndrome, Osteoporosis, PAF (paroxysmal atrial fibrillation) (HCC), Perennial allergic rhinitis, Right knee meniscal tear, and TIA (transient ischemic attack).  PSH:    Past Surgical History:  Procedure Laterality Date  . BACK SURGERY     L5  . CATARACT EXTRACTION    . CERVICAL CONE BIOPSY    . CERVICAL FUSION    . COLONOSCOPY    . COLONOSCOPY WITH PROPOFOL N/A 01/20/2017   Procedure: COLONOSCOPY WITH PROPOFOL;  Surgeon: Scot Jun, MD;  Location: [facility] ENDOSCOPY;  Service: Endoscopy;  Laterality: N/A;  . ESOPHAGOGASTRODUODENOSCOPY (EGD) WITH PROPOFOL N/A 01/20/2017   Procedure: ESOPHAGOGASTRODUODENOSCOPY (EGD) WITH PROPOFOL;  Surgeon: Scot Jun, MD;  Location: [facility] ENDOSCOPY;  Service: Endoscopy;  Laterality: N/A;  . ESOPHAGOGASTRODUODENOSCOPY (EGD)  WITH PROPOFOL N/A 04/28/2017   Procedure: ESOPHAGOGASTRODUODENOSCOPY (EGD) WITH PROPOFOL;  Surgeon: Scot Jun, MD;  Location: [facility] ENDOSCOPY;  Service: Endoscopy;  Laterality: N/A;    Current Outpatient Medications  Medication Sig Dispense Refill  . dorzolamide-timolol (COSOPT)  22.3-6.8 MG/ML ophthalmic solution Place 1 drop into the right eye 2 (two) times daily.    . fluticasone (FLONASE) 50 MCG/ACT nasal spray Place 2 sprays into both nostrils at bedtime as needed for allergies.     . hydrOXYzine (ATARAX/VISTARIL) 25 MG tablet Take 25 mg by mouth at bedtime as needed for sleep.    Marland Kitchen levothyroxine (SYNTHROID, LEVOTHROID) 25 MCG tablet Take 25 mcg by mouth daily before breakfast.     . LUMIGAN 0.01 % SOLN Place 1 drop into both eyes at bedtime.     . metoprolol succinate (TOPROL-XL) 25 MG 24 hr tablet Take 1 tablet (25 mg total) by mouth daily. 90 tablet 3  . pantoprazole (PROTONIX) 40 MG tablet Take 40 mg by mouth daily.    . rosuvastatin (CRESTOR) 5 MG tablet TAKE 1 TABLET DAILY AT 6 P.M. 90 tablet 3  . XARELTO 20 MG TABS tablet TAKE 1 TABLET DAILY WITH SUPPER 90 tablet 0   No current facility-administered medications for this visit.      Allergies:   Amoxicillin-pot clavulanate; Codeine; Other; Septra [sulfamethoxazole-trimethoprim]; Simvastatin; and Sulfa antibiotics   Social History:  The patient  reports that she has never smoked. She has never used smokeless tobacco. She reports that she drinks alcohol. She reports that she does not use drugs.   Family History:   family history includes Hypertension in her sister.    Review of Systems: Review of Systems  Constitutional: Negative.   Respiratory: Negative.   Cardiovascular: Negative.        Tachycardia  Gastrointestinal: Negative.   Musculoskeletal: Negative.   Neurological: Negative.   Psychiatric/Behavioral: Negative.   All other systems reviewed and are negative.    PHYSICAL EXAM: VS:  BP 108/60 (BP Location: Left Arm, Patient Position: Sitting, Cuff Size: Normal)   Pulse 67   Ht 5' 3.5" (1.613 m)   Wt 120 lb 8 oz (54.7 kg)   BMI 21.01 kg/m  , BMI Body mass index is 21.01 kg/m. Constitutional:  oriented to person, place, and time. No distress.  HENT:  Head: Normocephalic and atraumatic.   Eyes:  no discharge. No scleral icterus.  Neck: Normal range of motion. Neck supple. No JVD present.  Cardiovascular: Normal rate, regular rhythm, normal heart sounds and intact distal pulses. Exam reveals no gallop and no friction rub. No edema No murmur heard. Pulmonary/Chest: Effort normal and breath sounds normal. No stridor. No respiratory distress.  no wheezes.  no rales.  no tenderness.  Abdominal: Soft.  no distension.  no tenderness.  Musculoskeletal: Normal range of motion.  no  tenderness or deformity.  Neurological:  normal muscle tone. Coordination normal. No atrophy Skin: Skin is warm and dry. No rash noted. not diaphoretic.  Psychiatric:  normal mood and affect. behavior is normal. Thought content normal.   Recent Labs: 08/22/2017: ALT 21; Hemoglobin 16.2; Platelets 215 08/23/2017: TSH 0.365 08/24/2017: BUN 21; Creatinine, Ser 0.77; Potassium 3.6; Sodium 139    Lipid Panel Lab Results  Component Value Date   CHOL 140 09/08/2016   HDL 62 09/08/2016   LDLCALC 64 09/08/2016   TRIG 71 09/08/2016      Wt Readings from Last 3 Encounters:  10/09/17 120 lb 8 oz (54.7  kg)  08/24/17 136 lb 7.4 oz (61.9 kg)  04/28/17 120 lb (54.4 kg)       ASSESSMENT AND PLAN:   Paroxysmal atrial fibrillation (HCC) - Plan: EKG 12-Lead No recent episodes, no changes to her medications Currently on metoprolol She does have diltiazem for any breakthrough arrhythmia  Other specified transient cerebral ischemias On anticoagulation No recent bleeding  Hyperlipidemia Tolerating Crestor mild aortic and coronary atherosclerosis  Cholesterol at goal  Chest pain, unspecified chest pain type Denies any significant chest pain on today's visit  No further workup at this time  Encounter for anticoagulation discussion and counseling Tolerating Xarelto 20 mg daily    Total encounter time more than 25 minutes  Greater than 50% was spent in counseling and coordination of care with the  patient   Disposition:   F/U  12 months   Orders Placed This Encounter  Procedures  . EKG 12-Lead     Signed, Dossie Arbour, M.D., Ph.D. 10/09/2017  [facility] [place], [place] [phone]

## 2017-10-09 ENCOUNTER — Encounter: Payer: Self-pay | Admitting: Cardiovascular Disease

## 2017-10-09 ENCOUNTER — Ambulatory Visit (INDEPENDENT_AMBULATORY_CARE_PROVIDER_SITE_OTHER): Payer: Medicare Other | Admitting: Cardiovascular Disease

## 2017-10-09 VITALS — BP 108/60 | HR 67 | Ht 63.5 in | Wt 120.5 lb

## 2017-10-09 DIAGNOSIS — E782 Mixed hyperlipidemia: Secondary | ICD-10-CM | POA: Diagnosis not present

## 2017-10-09 DIAGNOSIS — Z7189 Other specified counseling: Secondary | ICD-10-CM

## 2017-10-09 DIAGNOSIS — R0789 Other chest pain: Secondary | ICD-10-CM | POA: Diagnosis not present

## 2017-10-09 DIAGNOSIS — N179 Acute kidney failure, unspecified: Secondary | ICD-10-CM

## 2017-10-09 DIAGNOSIS — I480 Paroxysmal atrial fibrillation: Secondary | ICD-10-CM | POA: Diagnosis not present

## 2017-10-09 DIAGNOSIS — G459 Transient cerebral ischemic attack, unspecified: Secondary | ICD-10-CM | POA: Diagnosis not present

## 2017-10-09 NOTE — Patient Instructions (Signed)

## 2017-10-12 ENCOUNTER — Other Ambulatory Visit: Payer: Self-pay | Admitting: Cardiovascular Disease

## 2017-12-18 ENCOUNTER — Other Ambulatory Visit: Payer: Self-pay | Admitting: Cardiovascular Disease

## 2017-12-18 NOTE — Telephone Encounter (Signed)
Please review for refill, Thanks !  

## 2018-03-17 ENCOUNTER — Other Ambulatory Visit: Payer: Self-pay | Admitting: Cardiovascular Disease

## 2018-06-05 ENCOUNTER — Other Ambulatory Visit: Payer: Self-pay | Admitting: Student

## 2018-06-05 DIAGNOSIS — R1013 Epigastric pain: Secondary | ICD-10-CM

## 2018-06-05 DIAGNOSIS — R110 Nausea: Secondary | ICD-10-CM

## 2018-06-05 DIAGNOSIS — R140 Abdominal distension (gaseous): Secondary | ICD-10-CM

## 2018-06-19 ENCOUNTER — Ambulatory Visit
Admission: RE | Admit: 2018-06-19 | Discharge: 2018-06-19 | Disposition: A | Discharge status: Home / Self Care | Payer: Medicare Other | Source: Ambulatory Visit | Attending: Student | Admitting: Student

## 2018-06-19 DIAGNOSIS — R110 Nausea: Secondary | ICD-10-CM | POA: Insufficient documentation

## 2018-06-19 DIAGNOSIS — R1013 Epigastric pain: Secondary | ICD-10-CM | POA: Insufficient documentation

## 2018-06-19 DIAGNOSIS — R140 Abdominal distension (gaseous): Secondary | ICD-10-CM | POA: Diagnosis present

## 2018-06-19 LAB — POCT I-STAT CREATININE: Creatinine, Ser: 1 mg/dL (ref 0.44–1.00)

## 2018-06-19 MED ORDER — IOPAMIDOL (ISOVUE-300) INJECTION 61%
85.0000 mL | Freq: Once | INTRAVENOUS | Status: AC | PRN
  Administered 2018-06-19: 85 mL via INTRAVENOUS

## 2018-06-28 ENCOUNTER — Other Ambulatory Visit: Payer: Self-pay | Admitting: Student

## 2018-06-28 DIAGNOSIS — R935 Abnormal findings on diagnostic imaging of other abdominal regions, including retroperitoneum: Secondary | ICD-10-CM

## 2018-06-28 DIAGNOSIS — K839 Disease of biliary tract, unspecified: Secondary | ICD-10-CM

## 2018-06-29 DIAGNOSIS — E038 Other specified hypothyroidism: Secondary | ICD-10-CM | POA: Insufficient documentation

## 2018-06-29 DIAGNOSIS — M8589 Other specified disorders of bone density and structure, multiple sites: Secondary | ICD-10-CM | POA: Insufficient documentation

## 2018-07-10 ENCOUNTER — Ambulatory Visit
Admission: RE | Admit: 2018-07-10 | Discharge: 2018-07-10 | Disposition: A | Discharge status: Home / Self Care | Payer: Medicare Other | Source: Ambulatory Visit | Attending: Student | Admitting: Student

## 2018-07-10 ENCOUNTER — Other Ambulatory Visit: Payer: Self-pay | Admitting: Student

## 2018-07-10 DIAGNOSIS — K839 Disease of biliary tract, unspecified: Secondary | ICD-10-CM | POA: Insufficient documentation

## 2018-07-10 DIAGNOSIS — R935 Abnormal findings on diagnostic imaging of other abdominal regions, including retroperitoneum: Secondary | ICD-10-CM | POA: Insufficient documentation

## 2018-07-10 MED ORDER — GADOBUTROL 1 MMOL/ML IV SOLN
5.0000 mL | Freq: Once | INTRAVENOUS | Status: AC | PRN
  Administered 2018-07-10: 5 mL via INTRAVENOUS

## 2018-07-13 ENCOUNTER — Other Ambulatory Visit: Payer: Self-pay | Admitting: Student

## 2018-07-13 DIAGNOSIS — R935 Abnormal findings on diagnostic imaging of other abdominal regions, including retroperitoneum: Secondary | ICD-10-CM

## 2018-07-24 ENCOUNTER — Ambulatory Visit
Admission: RE | Admit: 2018-07-24 | Discharge: 2018-07-24 | Disposition: A | Discharge status: Home / Self Care | Payer: Medicare Other | Source: Ambulatory Visit | Attending: Student | Admitting: Student

## 2018-07-24 DIAGNOSIS — R935 Abnormal findings on diagnostic imaging of other abdominal regions, including retroperitoneum: Secondary | ICD-10-CM | POA: Diagnosis present

## 2018-07-24 LAB — POCT I-STAT CREATININE: CREATININE: 0.9 mg/dL (ref 0.44–1.00)

## 2018-07-24 MED ORDER — IOHEXOL 350 MG/ML SOLN
75.0000 mL | Freq: Once | INTRAVENOUS | Status: AC | PRN
  Administered 2018-07-24: 75 mL via INTRAVENOUS

## 2018-08-09 ENCOUNTER — Ambulatory Visit (INDEPENDENT_AMBULATORY_CARE_PROVIDER_SITE_OTHER): Payer: Medicare Other | Admitting: Obstetrics and Gynecology

## 2018-08-09 ENCOUNTER — Encounter: Payer: Self-pay | Admitting: Obstetrics and Gynecology

## 2018-08-09 ENCOUNTER — Other Ambulatory Visit: Payer: Self-pay

## 2018-08-09 VITALS — BP 135/76 | HR 69 | Ht 63.0 in | Wt 129.8 lb

## 2018-08-09 DIAGNOSIS — N83209 Unspecified ovarian cyst, unspecified side: Secondary | ICD-10-CM | POA: Diagnosis not present

## 2018-08-09 NOTE — Progress Notes (Signed)
HPI:      [name] is a 84 y.o. [phone] who LMP was No LMP recorded. Patient is postmenopausal.  Subjective:   She presents today after being found to have a very small ovarian cyst by CT.  She is undergoing work-up for liver issues/biliary system and during the work-up she underwent 2 CTs and an MRI.  1 of the CTs revealed a 1.2 cm cyst.  The patient has no symptoms, no postmenopausal bleeding or no other pelvic complaints.    Hx: The following portions of the patient's history were reviewed and updated as appropriate:             She  has a past medical history of Arthritis, Asthma, Carpal tunnel syndrome, Colonic polyp, Degenerative joint disease of cervical and lumbar spine, Dysrhythmia, GERD (gastroesophageal reflux disease), Glaucoma, Hashimoto's thyroiditis, Hiatal hernia, History of depression, Hyperlipidemia, Hypothyroidism, Irritable bowel syndrome, Osteoporosis, PAF (paroxysmal atrial fibrillation) (HCC), Perennial allergic rhinitis, Right knee meniscal tear, and TIA (transient ischemic attack). She does not have any pertinent problems on file. She  has a past surgical history that includes Cervical fusion; Back surgery; Colonoscopy; Cervical cone biopsy; Cataract extraction; Colonoscopy with propofol (N/A, 01/20/2017); Esophagogastroduodenoscopy (egd) with propofol (N/A, 01/20/2017); and Esophagogastroduodenoscopy (egd) with propofol (N/A, 04/28/2017). Her family history includes Hypertension in her sister. She  reports that she has never smoked. She has never used smokeless tobacco. She reports current alcohol use. She reports that she does not use drugs. She has a current medication list which includes the following prescription(s): dorzolamide-timolol, fluticasone, levothyroxine, lumigan, metoprolol succinate, pantoprazole, rivaroxaban, and rosuvastatin. She is allergic to amoxicillin-pot clavulanate; codeine; other; septra [sulfamethoxazole-trimethoprim]; simvastatin; and  sulfa antibiotics.       Review of Systems:  Review of Systems  Constitutional: Denied constitutional symptoms, night sweats, recent illness, fatigue, fever, insomnia and weight loss.  Eyes: Denied eye symptoms, eye pain, photophobia, vision change and visual disturbance.  Ears/Nose/Throat/Neck: Denied ear, nose, throat or neck symptoms, hearing loss, nasal discharge, sinus congestion and sore throat.  Cardiovascular: Denied cardiovascular symptoms, arrhythmia, chest pain/pressure, edema, exercise intolerance, orthopnea and palpitations.  Respiratory: Denied pulmonary symptoms, asthma, pleuritic pain, productive sputum, cough, dyspnea and wheezing.  Gastrointestinal: Denied, gastro-esophageal reflux, melena, nausea and vomiting.  Genitourinary: Denied genitourinary symptoms including symptomatic vaginal discharge, pelvic relaxation issues, and urinary complaints.  Musculoskeletal: Denied musculoskeletal symptoms, stiffness, swelling, muscle weakness and myalgia.  Dermatologic: Denied dermatology symptoms, rash and scar.  Neurologic: Denied neurology symptoms, dizziness, headache, neck pain and syncope.  Psychiatric: Denied psychiatric symptoms, anxiety and depression.  Endocrine: Denied endocrine symptoms including hot flashes and night sweats.   Meds:   Current Outpatient Medications on File Prior to Visit  Medication Sig Dispense Refill  . dorzolamide-timolol (COSOPT) 22.3-6.8 MG/ML ophthalmic solution Place 1 drop into the right eye 2 (two) times daily.    . fluticasone (FLONASE) 50 MCG/ACT nasal spray Place 2 sprays into both nostrils at bedtime as needed for allergies.     Marland Kitchen levothyroxine (SYNTHROID, LEVOTHROID) 25 MCG tablet Take 25 mcg by mouth daily before breakfast.     . LUMIGAN 0.01 % SOLN Place 1 drop into both eyes at bedtime.     . metoprolol succinate (TOPROL-XL) 25 MG 24 hr tablet TAKE 1 TABLET DAILY 90 tablet 3  . pantoprazole (PROTONIX) 40 MG tablet Take 40 mg by mouth  daily.    . rivaroxaban (XARELTO) 20 MG TABS tablet Take 1 tablet (20 mg total) by mouth daily  with supper. 90 tablet 2  . rosuvastatin (CRESTOR) 5 MG tablet TAKE 1 TABLET DAILY AT 6 P.M. 90 tablet 3   No current facility-administered medications on file prior to visit.     Objective:     Vitals:   08/09/18 1040  BP: 135/76  Pulse: 69              CT scan reviewed in detail directly with the patient.  Assessment:    [id number] Patient Active Problem List   Diagnosis Date Noted  . AKI (acute kidney injury) (HCC) 08/23/2017  . PAF (paroxysmal atrial fibrillation) (HCC)   . Encounter for anticoagulation discussion and counseling 11/10/2014  . Atrial fibrillation (HCC) 08/04/2014  . Tachycardia 06/23/2014  . TIA (transient ischemic attack) 06/23/2014  . Hashimoto's thyroiditis 06/23/2014  . Jaw pain, non-TMJ 06/23/2014  . Neck pain 06/23/2014  . Chest pain 08/17/2012  . Hyperlipidemia 08/17/2012     1. Cyst of ovary, unspecified laterality     Very small ovarian cyst   Plan:            1.  Asymptomatic ovarian cyst of this size requires no work-up even in a postmenopausal woman.  I have reassured her regarding these findings.  She is to contact us if her symptoms change. Orders No orders of the defined types were placed in this encounter.   No orders of the defined types were placed in this encounter.     F/U  Return in about 1 week (around 08/16/2018) for Annual Physical. I spent 12 minutes involved in the care of this patient of which greater than 50% was spent discussing ovarian cyst, medical history of biliary obstruction and continued work-up.  All questions answered.  Elonda Husky, M.D. 08/09/2018 11:23 AM

## 2018-08-09 NOTE — Progress Notes (Signed)
Patient comes in today for GYN appointment. She has a ovarian cyst that was seen on her CT scan.

## 2018-10-23 ENCOUNTER — Other Ambulatory Visit: Payer: Self-pay | Admitting: Cardiovascular Disease

## 2018-12-04 ENCOUNTER — Telehealth: Payer: Self-pay | Admitting: Cardiovascular Disease

## 2018-12-04 NOTE — Telephone Encounter (Signed)

## 2018-12-04 NOTE — Progress Notes (Signed)
Cardiology Office Note  Date:  12/05/2018   ID:  Jacqueline, Powers 1934-09-13, MRN [id number]  PCP:  Baxter Hire, MD   Chief Complaint  Patient presents with  . other    Past due 12 month follow up. Patient denies chest pain and SOB. Meds review verbally with patient.     HPI:  Jacqueline Powers is a pleasant 84 year old woman with a history of  hyperlipidemia,  Hashimoto's thyroiditis with elevated TSH, osteoporosis,  remote history of chest pain,  GERD,  previously evaluated for jaw pain, neck pain, tachycardia CT coronary calcium score (score of 17) history of neck  surgery, fusion  30 day monitor documented atrial fibrillation.  Prior TIA She presents today for follow-up of her atrial fibrillation   "Stomach bleeding/discomfort", Seen by Dr. Cain Saupe to [facility] Referred to biliary, biliary is stable Was having some right upper quadrant discomfort She feels it is her small intestines  Zetia made stomach hurt, stop the medication Takes crestor QOD Total chol 230  Rare palpitations, maybe atrial fib, not long lasting  ER norovirus, 08/22/2017 Dehydrated, CR 1.6  Prior fall, recovered  EKG personally reviewed by myself on todays visit Shows normal sinus rhythm rate 70 bpm no significant ST or T-wave changes  Other past medical history reviewed visit in the emergency room 03/26/16: atrial fib rate 150 bpm She was sleeping when she was woken up by severe tachycardia EKG showing atrial fibrillation, converted to NSR w/o intervention  PreviousCT scan imaging reviewed with her showing mild coronary calcification, mild descending aortic calcification   30 day monitor shows atrial fibrillation on February 10 lasting for several hours, 9:30 at night until at least 1 AM on 07/10/2014   TIA February 2015.  She had left upper extremity, left lower extremity weakness that resolved in 15 minutes CT scan and MRI did not document stroke. Carotid ultrasound showed mild  disease on one side, minimal on the other She was started on aspirin and discharged home. No further episodes of TIA or stroke over the past year.  Previous episodes of tachycardia, jaw pain, neck pain lasting 45 minutes up to one hour. Repeat episode 06/05/2014 with heart rate up to 140 bpm. Again some jaw pain. She reports having an episode in the past where she had jaw pain, heart rate up to 100.   PMH:   has a past medical history of Arthritis, Asthma, Carpal tunnel syndrome, Colonic polyp, Degenerative joint disease of cervical and lumbar spine, Dysrhythmia, GERD (gastroesophageal reflux disease), Glaucoma, Hashimoto's thyroiditis, Hiatal hernia, History of depression, Hyperlipidemia, Hypothyroidism, Irritable bowel syndrome, Osteoporosis, PAF (paroxysmal atrial fibrillation) ([place]), Perennial allergic rhinitis, Right knee meniscal tear, and TIA (transient ischemic attack).  PSH:    Past Surgical History:  Procedure Laterality Date  . BACK SURGERY     L5  . CATARACT EXTRACTION    . CERVICAL CONE BIOPSY    . CERVICAL FUSION    . COLONOSCOPY    . COLONOSCOPY WITH PROPOFOL N/A 01/20/2017   Procedure: COLONOSCOPY WITH PROPOFOL;  Surgeon: Manya Silvas, MD;  Location: [facility] ENDOSCOPY;  Service: Endoscopy;  Laterality: N/A;  . ESOPHAGOGASTRODUODENOSCOPY (EGD) WITH PROPOFOL N/A 01/20/2017   Procedure: ESOPHAGOGASTRODUODENOSCOPY (EGD) WITH PROPOFOL;  Surgeon: Manya Silvas, MD;  Location: [facility] ENDOSCOPY;  Service: Endoscopy;  Laterality: N/A;  . ESOPHAGOGASTRODUODENOSCOPY (EGD) WITH PROPOFOL N/A 04/28/2017   Procedure: ESOPHAGOGASTRODUODENOSCOPY (EGD) WITH PROPOFOL;  Surgeon: Manya Silvas, MD;  Location: [facility] ENDOSCOPY;  Service: Endoscopy;  Laterality:  N/A;    Current Outpatient Medications  Medication Sig Dispense Refill  . dorzolamide-timolol (COSOPT) 22.3-6.8 MG/ML ophthalmic solution Place 1 drop into the right eye 2 (two) times daily.    . fluticasone (FLONASE) 50 MCG/ACT  nasal spray Place 2 sprays into both nostrils at bedtime as needed for allergies.     Marland Kitchen. levothyroxine (SYNTHROID, LEVOTHROID) 25 MCG tablet Take 25 mcg by mouth daily before breakfast.     . LUMIGAN 0.01 % SOLN Place 1 drop into both eyes at bedtime.     . metoprolol succinate (TOPROL-XL) 25 MG 24 hr tablet TAKE 1 TABLET DAILY 90 tablet 0  . pantoprazole (PROTONIX) 40 MG tablet Take 40 mg by mouth daily.    . rivaroxaban (XARELTO) 20 MG TABS tablet Take 1 tablet (20 mg total) by mouth daily with supper. 90 tablet 3  . rosuvastatin (CRESTOR) 5 MG tablet TAKE 1 TABLET DAILY AT 6 P.M. 90 tablet 3  . diltiazem (CARDIZEM) 30 MG tablet Take 1 tablet (30 mg total) by mouth 3 (three) times daily as needed (As needed for fast heart rates.). 90 tablet 3   No current facility-administered medications for this visit.      Allergies:   Amoxicillin-pot clavulanate, Codeine, Other, Septra [sulfamethoxazole-trimethoprim], Simvastatin, and Sulfa antibiotics   Social History:  The patient  reports that she has never smoked. She has never used smokeless tobacco. She reports current alcohol use. She reports that she does not use drugs.   Family History:   family history includes Hypertension in her sister.    Review of Systems: Review of Systems  Constitutional: Negative.   Respiratory: Negative.   Cardiovascular: Negative.        Tachycardia  Gastrointestinal: Negative.   Musculoskeletal: Negative.   Neurological: Negative.   Psychiatric/Behavioral: Negative.   All other systems reviewed and are negative.    PHYSICAL EXAM: VS:  BP 130/70 (BP Location: Left Arm, Patient Position: Sitting, Cuff Size: Normal)   Pulse 70   Ht 5\' 3"  (1.6 m)   Wt 132 lb (59.9 kg)   BMI 23.38 kg/m  , BMI Body mass index is 23.38 kg/m. Constitutional:  oriented to person, place, and time. No distress.  HENT:  Head: Normocephalic and atraumatic.  Eyes:  no discharge. No scleral icterus.  Neck: Normal range of  motion. Neck supple. No JVD present.  Cardiovascular: Normal rate, regular rhythm, normal heart sounds and intact distal pulses. Exam reveals no gallop and no friction rub. No edema No murmur heard. Pulmonary/Chest: Effort normal and breath sounds normal. No stridor. No respiratory distress.  no wheezes.  no rales.  no tenderness.  Abdominal: Soft.  no distension.  no tenderness.  Musculoskeletal: Normal range of motion.  no  tenderness or deformity.  Neurological:  normal muscle tone. Coordination normal. No atrophy Skin: Skin is warm and dry. No rash noted. not diaphoretic.  Psychiatric:  normal mood and affect. behavior is normal. Thought content normal.   Recent Labs: 07/24/2018: Creatinine, Ser 0.90    Lipid Panel Lab Results  Component Value Date   CHOL 140 09/08/2016   HDL 62 09/08/2016   LDLCALC 64 09/08/2016   TRIG 71 09/08/2016      Wt Readings from Last 3 Encounters:  12/05/18 132 lb (59.9 kg)  08/09/18 129 lb 12.8 oz (58.9 kg)  10/09/17 120 lb 8 oz (54.7 kg)       ASSESSMENT AND PLAN:   Paroxysmal atrial fibrillation (HCC) - Plan: EKG 12-Lead  Diltiazem for breakthrough arrhythmia, stay on metoprolol and anticoagulation  Other specified transient cerebral ischemias On anticoagulation Prior TIAs, none recently Stable  Hyperlipidemia Tolerating Crestor Recommend she try to take this more frequently mild aortic and coronary atherosclerosis  Previous total cholesterol 5 years ago 160 now up to 230  Chest pain, unspecified chest pain type No chest pain  Encounter for anticoagulation discussion and counseling Tolerating Xarelto 20 mg daily    Total encounter time more than 25 minutes  Greater than 50% was spent in counseling and coordination of care with the patient   Disposition:   F/U  12 months   Orders Placed This Encounter  Procedures  . EKG 12-Lead     Signed, Dossie Arbourim Arhianna Ebey, M.D., Ph.D. 12/05/2018  [facility],  [place] [phone]

## 2018-12-05 ENCOUNTER — Ambulatory Visit (INDEPENDENT_AMBULATORY_CARE_PROVIDER_SITE_OTHER): Payer: Medicare Other | Admitting: Cardiovascular Disease

## 2018-12-05 ENCOUNTER — Other Ambulatory Visit: Payer: Self-pay

## 2018-12-05 VITALS — BP 130/70 | HR 70 | Ht 63.0 in | Wt 132.0 lb

## 2018-12-05 DIAGNOSIS — N179 Acute kidney failure, unspecified: Secondary | ICD-10-CM

## 2018-12-05 DIAGNOSIS — G459 Transient cerebral ischemic attack, unspecified: Secondary | ICD-10-CM | POA: Diagnosis not present

## 2018-12-05 DIAGNOSIS — E782 Mixed hyperlipidemia: Secondary | ICD-10-CM | POA: Diagnosis not present

## 2018-12-05 DIAGNOSIS — R0789 Other chest pain: Secondary | ICD-10-CM

## 2018-12-05 DIAGNOSIS — I480 Paroxysmal atrial fibrillation: Secondary | ICD-10-CM

## 2018-12-05 MED ORDER — DILTIAZEM HCL 30 MG PO TABS: 30.0000 mg | ORAL_TABLET | Freq: Three times a day (TID) | ORAL | 3 refills | Status: DC | PRN

## 2018-12-05 MED ORDER — RIVAROXABAN 20 MG PO TABS: 20.0000 mg | ORAL_TABLET | Freq: Every day | ORAL | 3 refills | Status: DC

## 2018-12-05 NOTE — Patient Instructions (Signed)

## 2019-01-16 ENCOUNTER — Other Ambulatory Visit: Payer: Self-pay | Admitting: Cardiovascular Disease

## 2019-03-22 ENCOUNTER — Other Ambulatory Visit: Payer: Self-pay

## 2019-03-22 ENCOUNTER — Encounter (INDEPENDENT_AMBULATORY_CARE_PROVIDER_SITE_OTHER): Payer: Self-pay | Admitting: Vascular Surgery

## 2019-03-22 ENCOUNTER — Ambulatory Visit (INDEPENDENT_AMBULATORY_CARE_PROVIDER_SITE_OTHER): Payer: Medicare Other | Admitting: Vascular Surgery

## 2019-03-22 VITALS — BP 133/76 | HR 72 | Resp 16 | Wt 131.8 lb

## 2019-03-22 DIAGNOSIS — R1084 Generalized abdominal pain: Secondary | ICD-10-CM | POA: Diagnosis not present

## 2019-03-22 DIAGNOSIS — R109 Unspecified abdominal pain: Secondary | ICD-10-CM | POA: Insufficient documentation

## 2019-03-22 DIAGNOSIS — I480 Paroxysmal atrial fibrillation: Secondary | ICD-10-CM

## 2019-03-22 DIAGNOSIS — E782 Mixed hyperlipidemia: Secondary | ICD-10-CM | POA: Diagnosis not present

## 2019-03-22 DIAGNOSIS — I700 Atherosclerosis of aorta: Secondary | ICD-10-CM | POA: Insufficient documentation

## 2019-03-22 NOTE — Patient Instructions (Signed)
Chronic Mesenteric Ischemia  Chronic mesenteric ischemia is poor blood flow (circulation) in the vessels that supply blood to the stomach, intestines, and liver (mesenteric organs). When the blood supply is severely restricted, these organs cannot work properly. This condition is also called mesenteric angina, or intestinal angina. This condition is a long-term (chronic) condition. It happens when an artery or vein that provides blood to the mesenteric organs gradually becomes blocked or narrows over time, restricting the blood supply to these organs. What are the causes? This condition is commonly caused by fatty deposits that build up in an artery (plaque), which can narrow the artery and restrict blood flow. Other causes include:  Weakened areas in blood vessel walls (aneurysms).  Conditions that cause twisting or inflammation of blood vessels, such as fibromuscular dysplasia or arteritis.  A disorder in which blood clots form in the veins (venous thrombosis).  Scarring and thickening (fibrosis) of blood vessels caused by radiation therapy.  A tear in the aorta, the body's main artery (aortic dissection).  Blood vessel problems after illegal drug use, such as use of cocaine.  Tumors in the nervous system (neurofibromatosis).  Certain autoimmune diseases, such as lupus. What increases the risk? The following factors may make you more likely to develop this condition:  Being female.  Being over age 50, especially if you have a history of heart problems.  Smoking.  Having congestive heart failure.  Having an irregular heartbeat (arrhythmia).  Having a history of heart attack or stroke.  Having diabetes.  Having high cholesterol.  Having high blood pressure (hypertension).  Being overweight or obese.  Having kidney disease (renal disease) that requires dialysis. What are the signs or symptoms? Symptoms of this condition include:  Pain or cramps in the abdomen that  develop 15-60 minutes after a meal. This pain may last for 1-3 hours. Some people may develop a fear of eating because of this symptom.  Weight loss.  Diarrhea.  Bloody stool.  Nausea.  Vomiting.  Bloating.  Abdominal pain after stress or with exercise. How is this diagnosed? This condition is diagnosed based on:  Your medical history.  A physical exam.  Tests, such as: ? Ultrasound. ? CT scan. ? Blood tests. ? Urine tests. ? An imaging test that involves injecting a dye into your arteries to show blood flow through blood vessels (angiogram). This can help to show if there are any blockages in the vessels that lead to the intestines. ? Passing a small probe through the mouth and into the stomach to measure the output of carbon dioxide (gastric tonometry). This can help to indicate whether there is decreased blood flow to the stomach and intestines. How is this treated? This condition may be treated with:  Dietary changes such as eating smaller, low-fat, meals more frequently.  Lifestyle changes to treat underlying conditions that contribute to the disease, such as high cholesterol and high blood pressure.  Medicines to reduce blood clotting and increase blood flow.  Surgery to remove the blockage, repair arteries or veins, and restore blood flow. This may involve: ? Angioplasty. This is surgery to widen the affected artery, reduce the blockage, and sometimes insert a small, mesh tube (stent). ? Bypass surgery. This may be done to go around (bypass) the blockage and reconnect healthy arteries or veins. ? Placing a stent in the affected area. This may be done to help keep blocked arteries open. Follow these instructions at home: Eating and drinking   Eat a heart-healthy diet. This   includes fresh fruits and vegetables, whole grains, and lean proteins like chicken, fish, and beans.  Avoid foods that contain a lot of: ? Salt (sodium). ? Sugar. ? Saturated fat (such as  red meat). ? Trans fat (such as in fried foods).  Stay hydrated. Drink enough fluid to keep your urine pale yellow. Lifestyle  Stay active and get regular exercise as told by your health care provider. Aim for 150 minutes of moderate activity or 75 minutes of vigorous activity a week. Ask your health care provider what activities and forms of exercise are safe for you.  Maintain a healthy weight.  Work with your health care provider to manage your cholesterol.  Manage any other health problems you have, such as high blood pressure, diabetes, or heart rhythm problems.  Do not use any products that contain nicotine or tobacco, such as cigarettes, e-cigarettes, and chewing tobacco. If you need help quitting, ask your health care provider. General instructions  Take over-the-counter and prescription medicines only as told by your health care provider.  Keep all follow-up visits as told by your health care provider. This is important.  You may need to take actions to prevent or treat constipation, such as: ? Drink enough fluid to keep your urine pale yellow. ? Take over-the-counter or prescription medicines. ? Eat foods that are high in fiber, such as beans, whole grains, and fresh fruits and vegetables. ? Limit foods that are high in fat and processed sugars, such as fried or sweet foods. Contact a health care provider if:  Your symptoms do not improve or they return after treatment.  You have a fever.  You are constipated. Get help right away if you:  Have severe abdominal pain.  Have severe chest pain.  Have shortness of breath.  Feel weak or dizzy.  Have fast or irregular heartbeats (palpitations).  Have numbness or weakness in your face, arm, or leg.  Are confused.  Have trouble speaking or people have trouble understanding what you are saying.  Have trouble urinating.  Have blood in your stool.  Have severe nausea, vomiting, or persistent diarrhea. These  symptoms may represent a serious problem that is an emergency. Do not wait to see if the symptoms will go away. Get medical help right away. Call your local emergency services (911 in the [place] Do not drive yourself to the hospital. Summary  Mesenteric ischemia is poor circulation in the vessels that supply blood to the the stomach, intestines, and liver (mesenteric organs).  This condition happens when an artery or vein that provides blood to the mesenteric organs gradually becomes blocked or narrow, restricting the blood supply to the organs.  This condition is commonly caused by fatty deposits that build up in an artery (plaque), which can narrow the artery and restrict blood flow.  You are more likely to develop this condition if you are over age 50 and have a history of heart problems, high blood pressure, diabetes, or high cholesterol.  This condition is usually treated with medicines, dietary and lifestyle changes, and surgery to remove the blockage, repair arteries or veins, and restore blood flow. This information is not intended to replace advice given to you by your health care provider. Make sure you discuss any questions you have with your health care provider. Document Released: 01/03/2011 Document Revised: 01/19/2018 Document Reviewed: 01/19/2018 Elsevier Patient Education  2020 [employer]  

## 2019-03-22 NOTE — Assessment & Plan Note (Signed)
Rate controlled and on anticoagulation. 

## 2019-03-22 NOTE — Assessment & Plan Note (Signed)
lipid control important in reducing the progression of atherosclerotic disease. Continue statin therapy  

## 2019-03-22 NOTE — Progress Notes (Signed)
Patient ID: Jacqueline Powers Kandel, female   DOB: 08/03/1934, 84 y.o.   MRN: [id number]  Chief Complaint  Patient presents with  . New Patient (Initial Visit)    ref Letitia LibraJohnston for Mesenteric stenosis    HPI Jacqueline Powers Ace is a 84 y.o. female.  I am asked to see the patient by Dr. 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  The patient has had postprandial abdominal pain on and off for about 3 years.  It has gotten worse in the past couple of months.  She had an episode earlier this year where she lost almost 20 pounds and was unable to eat.  She has changed her dietary habits significantly because of this.  She denies any trauma or injury.  She has some questionable biliary findings that could explain her symptoms, but she had a CT scan performed earlier this year that showed SMA disease.  I have independently reviewed the scan.  The disease looks moderate at worst at that time but that has been many months ago.  She does have pain after most meals.  She does have some degree of food fear.     Past Medical History:  Diagnosis Date  . Arthritis   . Asthma   . Carpal tunnel syndrome   . Colonic polyp   . Degenerative joint disease of cervical and lumbar spine   . Dysrhythmia   . GERD (gastroesophageal reflux disease)   . Glaucoma   . Hashimoto's thyroiditis    a. 05/2002 s/p resection of thyroid nodule-->on replacement.  . Hiatal hernia   . History of depression   . Hyperlipidemia   . Hypothyroidism   . Irritable bowel syndrome   . Osteoporosis   . PAF (paroxysmal atrial fibrillation) (HCC)    a. CHA2DS2VASc = 5-->xarelto;    . Perennial allergic rhinitis   . Right knee meniscal tear   . TIA (transient ischemic attack)    a. 06/2013: LUE/LLE wkns x 15 mins, MRI neg for CVA.    Past Surgical History:  Procedure Laterality Date  . BACK SURGERY     L5  . CATARACT EXTRACTION    . CERVICAL CONE BIOPSY    . CERVICAL FUSION    . COLONOSCOPY    . COLONOSCOPY  WITH PROPOFOL N/A 01/20/2017   Procedure: COLONOSCOPY WITH PROPOFOL;  Surgeon: Scot JunElliott, Robert T, MD;  [facility];  Service: Endoscopy;  Laterality: N/A;  . ESOPHAGOGASTRODUODENOSCOPY (EGD) WITH PROPOFOL N/A 01/20/2017   Procedure: ESOPHAGOGASTRODUODENOSCOPY (EGD) WITH PROPOFOL;  Surgeon: Scot JunElliott, Robert T, MD;  [facility];  Service: Endoscopy;  Laterality: N/A;  . ESOPHAGOGASTRODUODENOSCOPY (EGD) WITH PROPOFOL N/A 04/28/2017   Procedure: ESOPHAGOGASTRODUODENOSCOPY (EGD) WITH PROPOFOL;  Surgeon: Scot JunElliott, Robert T, MD;  [facility];  Service: Endoscopy;  Laterality: N/A;     Family History  Problem Relation Age of Onset  . Hypertension Sister   no bleeding disorders, clotting disorders, or autoimmune diseases  Social History Social History   Tobacco Use  . Smoking status: Never Smoker  . Smokeless tobacco: Never Used  Substance Use Topics  . Alcohol use: Yes    Comment: none last 24hrs  . Drug use: No    Allergies  Allergen Reactions  . Amoxicillin-Pot Clavulanate Nausea And Vomiting    Has patient had a PCN reaction causing immediate rash, facial/tongue/throat swelling, SOB or lightheadedness with hypotension: No Has patient had a PCN reaction causing severe rash involving mucus membranes or skin  necrosis: No Has patient had a PCN reaction that required hospitalization: No Has patient had a PCN reaction occurring within the last 10 years: Yes  If all of the above answers are "NO", then may proceed with Cephalosporin use.   . Codeine Nausea And Vomiting  . Other Other (See Comments)    Other reaction(s): Unknown GLAUCOMA DROPS  . Septra [Sulfamethoxazole-Trimethoprim] Other (See Comments)    Reaction: unknown  . Simvastatin Other (See Comments)    "did something to muscles"  . Sulfa Antibiotics Other (See Comments)    Reaction: unknown    Current Outpatient Medications  Medication Sig Dispense Refill  . azelastine (ASTELIN) 0.1 %  nasal spray Place into both nostrils 2 (two) times daily. Use in each nostril as directed    . dorzolamide-timolol (COSOPT) 22.3-6.8 MG/ML ophthalmic solution Place 1 drop into the right eye 2 (two) times daily.    Marland Kitchen ezetimibe (ZETIA) 10 MG tablet Take 10 mg by mouth daily.    Marland Kitchen levothyroxine (SYNTHROID, LEVOTHROID) 25 MCG tablet Take 25 mcg by mouth daily before breakfast.     . LUMIGAN 0.01 % SOLN Place 1 drop into both eyes at bedtime.     . metoprolol succinate (TOPROL-XL) 25 MG 24 hr tablet TAKE 1 TABLET DAILY 90 tablet 3  . pantoprazole (PROTONIX) 40 MG tablet Take 40 mg by mouth daily.    . rivaroxaban (XARELTO) 20 MG TABS tablet Take 1 tablet (20 mg total) by mouth daily with supper. 90 tablet 3  . rosuvastatin (CRESTOR) 5 MG tablet TAKE 1 TABLET DAILY AT 6 P.Powers. 90 tablet 3  . diltiazem (CARDIZEM) 30 MG tablet Take 1 tablet (30 mg total) by mouth 3 (three) times daily as needed (As needed for fast heart rates.). (Patient not taking: Reported on 03/22/2019) 90 tablet 3  . fluticasone (FLONASE) 50 MCG/ACT nasal spray Place 2 sprays into both nostrils at bedtime as needed for allergies.      No current facility-administered medications for this visit.       REVIEW OF SYSTEMS (Negative unless checked)  Constitutional: [x] Weight loss  [] Fever  [] Chills Cardiac: [] Chest pain   [] Chest pressure   [] Palpitations   [] Shortness of breath when laying flat   [] Shortness of breath at rest   [] Shortness of breath with exertion. Vascular:  [] Pain in legs with walking   [] Pain in legs at rest   [] Pain in legs when laying flat   [] Claudication   [] Pain in feet when walking  [] Pain in feet at rest  [] Pain in feet when laying flat   [] History of DVT   [] Phlebitis   [] Swelling in legs   [] Varicose veins   [] Non-healing ulcers Pulmonary:   [] Uses home oxygen   [] Productive cough   [] Hemoptysis   [] Wheeze  [] COPD   [] Asthma Neurologic:  [] Dizziness  [] Blackouts   [] Seizures   [] History of stroke   [] History  of TIA  [] Aphasia   [] Temporary blindness   [] Dysphagia   [] Weakness or numbness in arms   [] Weakness or numbness in legs Musculoskeletal:  [] Arthritis   [] Joint swelling   [] Joint pain   [] Low back pain Hematologic:  [] Easy bruising  [] Easy bleeding   [] Hypercoagulable state   [] Anemic  [] Hepatitis Gastrointestinal:  [] Blood in stool   [] Vomiting blood  [] Gastroesophageal reflux/heartburn   [x] Abdominal pain Genitourinary:  [] Chronic kidney disease   [] Difficult urination  [] Frequent urination  [] Burning with urination   [] Hematuria Skin:  [] Rashes   [] Ulcers   []   Wounds Psychological:  [] History of anxiety   []  History of major depression.    Physical Exam BP 133/76 (BP Location: Right Arm)   Pulse 72   Resp 16   Wt 131 lb 12.8 oz (59.8 kg)   BMI 23.35 kg/Powers  Gen:  WD/WN, NAD. Appears far younger than stated age. Head: [employer]/AT, No temporalis wasting.  Ear/Nose/Throat: Hearing grossly intact, nares w/o erythema or drainage, oropharynx w/o Erythema/Exudate Eyes: Conjunctiva clear, sclera non-icteric  Neck: trachea midline.  No JVD.  Pulmonary:  Good air movement, respirations not labored, no use of accessory muscles  Cardiac: irregularly irregular Vascular:  Vessel Right Left  Radial Palpable Palpable                                   Gastrointestinal:. No masses, surgical incisions, or scars. Mild RUQ tenderness Musculoskeletal: Powers/S 5/5 throughout.  Extremities without ischemic changes.  No deformity or atrophy. No edema. Neurologic: Sensation grossly intact in extremities.  Symmetrical.  Speech is fluent. Motor exam as listed above. Psychiatric: Judgment intact, Mood & affect appropriate for pt's clinical situation. Dermatologic: No rashes or ulcers noted.  No cellulitis or open wounds.    Radiology No results found.  Labs No results found for this or any previous visit (from the past 2160 hour(s)).  Assessment/Plan:  No problem-specific Assessment & Plan notes found  for this encounter.      03/22/2019, 9:51 AM   This note was created with Dragon medical transcription system.  Any errors from dictation are unintentional.

## 2019-03-22 NOTE — Assessment & Plan Note (Signed)
The patient has abdominal pain that is not entirely clear in its etiology.  She has had some biliary work-up this has been only partially revealing.  Her symptoms are somewhat concerning for chronic mesenteric ischemia although a CT scan performed within the last year showed only moderate SMA disease.  Isolated moderate SMA disease is not likely to cause the symptoms, but certainly her disease could have progressed.  She also has atrial fibrillation could have had an embolus that made things worse recently.  At this point, I think a mesenteric duplex would be in her best interest for further evaluation.  Pending the results of this, angiogram can be considered if significant disease does appear to be present.  We have discussed the pathophysiology and natural history of mesenteric disease in some detail today.

## 2019-04-08 ENCOUNTER — Ambulatory Visit (INDEPENDENT_AMBULATORY_CARE_PROVIDER_SITE_OTHER): Payer: Medicare Other | Admitting: Nurse Practitioner

## 2019-04-08 ENCOUNTER — Encounter (INDEPENDENT_AMBULATORY_CARE_PROVIDER_SITE_OTHER): Payer: Self-pay | Admitting: Nurse Practitioner

## 2019-04-08 ENCOUNTER — Other Ambulatory Visit: Payer: Self-pay

## 2019-04-08 ENCOUNTER — Ambulatory Visit (INDEPENDENT_AMBULATORY_CARE_PROVIDER_SITE_OTHER): Payer: Medicare Other

## 2019-04-08 VITALS — BP 133/69 | HR 77 | Resp 18 | Ht 63.0 in | Wt 131.0 lb

## 2019-04-08 DIAGNOSIS — R1084 Generalized abdominal pain: Secondary | ICD-10-CM | POA: Diagnosis not present

## 2019-04-08 DIAGNOSIS — K551 Chronic vascular disorders of intestine: Secondary | ICD-10-CM | POA: Diagnosis not present

## 2019-04-08 DIAGNOSIS — E782 Mixed hyperlipidemia: Secondary | ICD-10-CM | POA: Diagnosis not present

## 2019-04-08 NOTE — Progress Notes (Signed)
SUBJECTIVE:  Patient ID: Jacqueline Powers, female    DOB: August 11, 1934, 84 y.o.   MRN: [id number] Chief Complaint  Patient presents with  . Follow-up    HPI  Jacqueline Powers is a 84 y.o. female that presents today for follow-up noninvasive studies to evaluate SMA disease as well as chronic abdominal pain.  The patient states that she has had some postprandial abdominal pain on and off for the last 3 years.  It is gotten worse the last couple of months.  She also does note that she has lost approximately 20 pounds due to not being able to eat.  She is also changed her dietary habits however this is not helped.  The patient does endorse having some postprandial pain as well as postprandial nausea.  She has not yet had any vomiting at this point.  Previous CT scan done in February did show mild to moderate disease of the SMA.  Independent review by Dr. Lucky Cowboy showed moderate atherosclerosis.  Today the patient underwent noninvasive studies which show a normal celiac artery, splenic artery and hepatic artery.  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  Past Medical History:  Diagnosis Date  . Arthritis   . Asthma   . Carpal tunnel syndrome   . Colonic polyp   . Degenerative joint disease of cervical and lumbar spine   . Dysrhythmia   . GERD (gastroesophageal reflux disease)   . Glaucoma   . Hashimoto's thyroiditis    a. 05/2002 s/p resection of thyroid nodule-->on replacement.  . Hiatal hernia   . History of depression   . Hyperlipidemia   . Hypothyroidism   . Irritable bowel syndrome   . Osteoporosis   . PAF (paroxysmal atrial fibrillation) (HCC)    a. CHA2DS2VASc = 5-->xarelto;    . Perennial allergic rhinitis   . Right knee meniscal tear   . TIA (transient ischemic attack)    a. 06/2013: LUE/LLE wkns x 15 mins, MRI neg for CVA.    Past Surgical History:  Procedure Laterality Date  . BACK SURGERY     L5  . CATARACT EXTRACTION    . CERVICAL CONE  BIOPSY    . CERVICAL FUSION    . COLONOSCOPY    . COLONOSCOPY WITH PROPOFOL N/A 01/20/2017   Procedure: COLONOSCOPY WITH PROPOFOL;  Surgeon: Manya Silvas, MD;  Location: [facility] ENDOSCOPY;  Service: Endoscopy;  Laterality: N/A;  . ESOPHAGOGASTRODUODENOSCOPY (EGD) WITH PROPOFOL N/A 01/20/2017   Procedure: ESOPHAGOGASTRODUODENOSCOPY (EGD) WITH PROPOFOL;  Surgeon: Manya Silvas, MD;  Location: [facility] ENDOSCOPY;  Service: Endoscopy;  Laterality: N/A;  . ESOPHAGOGASTRODUODENOSCOPY (EGD) WITH PROPOFOL N/A 04/28/2017   Procedure: ESOPHAGOGASTRODUODENOSCOPY (EGD) WITH PROPOFOL;  Surgeon: Manya Silvas, MD;  Location: [facility] ENDOSCOPY;  Service: Endoscopy;  Laterality: N/A;    Social History   Socioeconomic History  . Marital status: Widowed    Spouse name: Not on file  . Number of children: Not on file  . Years of education: Not on file  . Highest education level: Not on file  Occupational History  . Not on file  Social Needs  . Financial resource strain: Not on file  . Food insecurity    Worry: Not on file    Inability: Not on file  . Transportation needs    Medical: Not on file    Non-medical: Not on file  Tobacco Use  . Smoking status: Never Smoker  . Smokeless tobacco: Never Used  Substance and Sexual Activity  . Alcohol use: Yes    Comment: none last 24hrs  . Drug use: No  . Sexual activity: Never    Birth control/protection: None  Lifestyle  . Physical activity    Days per week: Not on file    Minutes per session: Not on file  . Stress: Not on file  Relationships  . Social Musician on phone: Not on file    Gets together: Not on file    Attends religious service: Not on file    Active member of club or organization: Not on file    Attends meetings of clubs or organizations: Not on file    Relationship status: Not on file  . Intimate partner violence    Fear of current or ex partner: Not on file    Emotionally abused: Not on file    Physically abused:  Not on file    Forced sexual activity: Not on file  Other Topics Concern  . Not on file  Social History Narrative  . Not on file    Family History  Problem Relation Age of Onset  . Hypertension Sister     Allergies  Allergen Reactions  . Amoxicillin-Pot Clavulanate Nausea And Vomiting    Has patient had a PCN reaction causing immediate rash, facial/tongue/throat swelling, SOB or lightheadedness with hypotension: No Has patient had a PCN reaction causing severe rash involving mucus membranes or skin necrosis: No Has patient had a PCN reaction that required hospitalization: No Has patient had a PCN reaction occurring within the last 10 years: Yes  If all of the above answers are "NO", then may proceed with Cephalosporin use.   . Codeine Nausea And Vomiting  . Other Other (See Comments)    Other reaction(s): Unknown GLAUCOMA DROPS  . Septra [Sulfamethoxazole-Trimethoprim] Other (See Comments)    Reaction: unknown  . Simvastatin Other (See Comments)    "did something to muscles"  . Sulfa Antibiotics Other (See Comments)    Reaction: unknown     Review of Systems   Review of Systems: Negative Unless Checked Constitutional: [x] Weight loss  [] Fever  [] Chills Cardiac: [] Chest pain   []  Atrial Fibrillation  [] Palpitations   [] Shortness of breath when laying flat   [] Shortness of breath with exertion. [] Shortness of breath at rest Vascular:  [] Pain in legs with walking   [] Pain in legs with standing [] Pain in legs when laying flat   [] Claudication    [] Pain in feet when laying flat    [] History of DVT   [] Phlebitis   [] Swelling in legs   [] Varicose veins   [] Non-healing ulcers Pulmonary:   [] Uses home oxygen   [] Productive cough   [] Hemoptysis   [] Wheeze  [] COPD   [] Asthma Neurologic:  [] Dizziness   [] Seizures  [] Blackouts [] History of stroke   [] History of TIA  [] Aphasia   [] Temporary Blindness   [] Weakness or numbness in arm   [] Weakness or numbness in leg Musculoskeletal:    [] Joint swelling   [] Joint pain   [] Low back pain  []  History of Knee Replacement [x] Arthritis [] back Surgeries  []  Spinal Stenosis    Hematologic:  [] Easy bruising  [] Easy bleeding   [] Hypercoagulable state   [] Anemic Gastrointestinal:  [] Diarrhea   [] Vomiting  [] Gastroesophageal reflux/heartburn   [] Difficulty swallowing. [x] Abdominal pain Genitourinary:  [] Chronic kidney disease   [] Difficult urination  [] Anuric   [] Blood in urine [] Frequent urination  [] Burning with urination   [] Hematuria Skin:  [] Rashes   []   Ulcers [] Wounds Psychological:  [] History of anxiety   []  History of major depression  []  Memory Difficulties      OBJECTIVE:   Physical Exam  BP 133/69 (BP Location: Right Arm)   Pulse 77   Resp 18   Ht 5\' 3"  (1.6 m)   Wt 131 lb (59.4 kg)   BMI 23.21 kg/m   Gen: WD/WN, NAD Head: [place]/AT, No temporalis wasting.  Ear/Nose/Throat: Hearing grossly intact, nares w/o erythema or drainage Eyes: PER, EOMI, sclera nonicteric.  Neck: Supple, no masses.  No JVD.  Pulmonary:  Good air movement, no use of accessory muscles.  Cardiac: RRR Vascular:  Vessel Right Left  Radial Palpable Palpable   Gastrointestinal: soft, non-distended. No guarding/no peritoneal signs.  Musculoskeletal: M/S 5/5 throughout.  No deformity or atrophy.  Neurologic: Pain and light touch intact in extremities.  Symmetrical.  Speech is fluent. Motor exam as listed above. Psychiatric: Judgment intact, Mood & affect appropriate for pt's clinical situation. Dermatologic: No Venous rashes. No Ulcers Noted.  No changes consistent with cellulitis. Lymph : No Cervical lymphadenopathy, no lichenification or skin changes of chronic lymphedema.       ASSESSMENT AND PLAN:  1. Chronic mesenteric ischemia (HCC) I had a long discussion with the patient regarding atherosclerosis as well as risk factors as well as what superior mesenteric stenosis entails.  We discussed the possibility of undergoing an angiogram however at  this time the patient does not wish to move forward with angiogram.  Given the fact that her symptoms are not necessarily constant this is acceptable.  Advised the patient that if her pain becomes worse or she starts to notice vomiting after eating that that may indicate worsening of her disease and that we may need to proceed with intervention.  Otherwise we will have the patient follow-up in 3 months for repeat noninvasive studies to evaluate the progression of her disease.  2. Mixed hyperlipidemia Continue statin as ordered and reviewed, no changes at this time    Current Outpatient Medications on File Prior to Visit  Medication Sig Dispense Refill  . azelastine (ASTELIN) 0.1 % nasal spray Place into both nostrils 2 (two) times daily. Use in each nostril as directed    . diltiazem (CARDIZEM) 30 MG tablet Take 1 tablet (30 mg total) by mouth 3 (three) times daily as needed (As needed for fast heart rates.). 90 tablet 3  . dorzolamide-timolol (COSOPT) 22.3-6.8 MG/ML ophthalmic solution Place 1 drop into the right eye 2 (two) times daily.    Marland Kitchen. ezetimibe (ZETIA) 10 MG tablet Take 10 mg by mouth daily.    . fluticasone (FLONASE) 50 MCG/ACT nasal spray Place 2 sprays into both nostrils at bedtime as needed for allergies.     Marland Kitchen. levothyroxine (SYNTHROID, LEVOTHROID) 25 MCG tablet Take 25 mcg by mouth daily before breakfast.     . LUMIGAN 0.01 % SOLN Place 1 drop into both eyes at bedtime.     . metoprolol succinate (TOPROL-XL) 25 MG 24 hr tablet TAKE 1 TABLET DAILY 90 tablet 3  . pantoprazole (PROTONIX) 40 MG tablet Take 40 mg by mouth daily.    . rivaroxaban (XARELTO) 20 MG TABS tablet Take 1 tablet (20 mg total) by mouth daily with supper. 90 tablet 3  . rosuvastatin (CRESTOR) 5 MG tablet TAKE 1 TABLET DAILY AT 6 P.M. 90 tablet 3   No current facility-administered medications on file prior to visit.     There are no Patient Instructions on file  for this visit. No follow-ups on file.   Georgiana Spinner, NP  This note was completed with Office manager.  Any errors are purely unintentional.

## 2019-05-09 IMAGING — CT CT CTA ABD/PEL W/CM AND/OR W/O CM
2 of 9 series · 11 of 46 positions shown, 15 images · IV contrast (omnipaque)
Comparison: Prior MRI abdomen/pelvis 07/10/2018; prior CT scan of
the abdomen and pelvis 06/19/2018 and 03/28/2017

CLINICAL DATA: 83-year-old female with epigastric pain and weight
loss concerning for chronic mesenteric ischemia.

EXAM:
CT ANGIOGRAPHY ABDOMEN AND PELVIS WITH CONTRAST AND WITHOUT CONTRAST
TECHNIQUE: Multidetector CT imaging of the abdomen and pelvis was performed
using the standard protocol during bolus administration of
intravenous contrast. Multiplanar reconstructed images and MIPs were
obtained and reviewed to evaluate the vascular anatomy.
CONTRAST:  75mL OMNIPAQUE IOHEXOL 350 MG/ML SOLN

[Series 7: axial venous · axial · portal-venous · 0.66mm/px · z∈[-466,-92]mm · 9 of 222 slices shown, 13 images]
[im 18/222  soft-tissue]
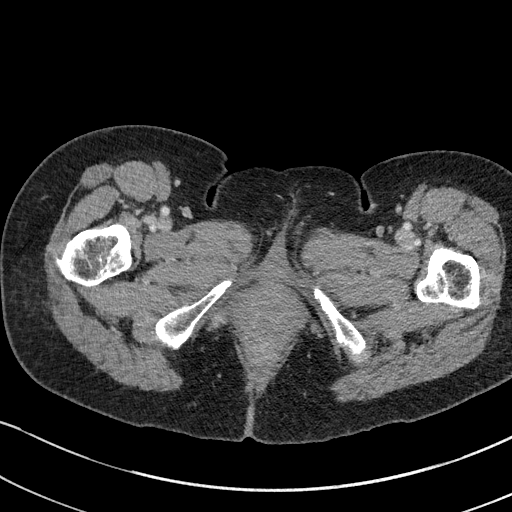
[im 18/222  bone]
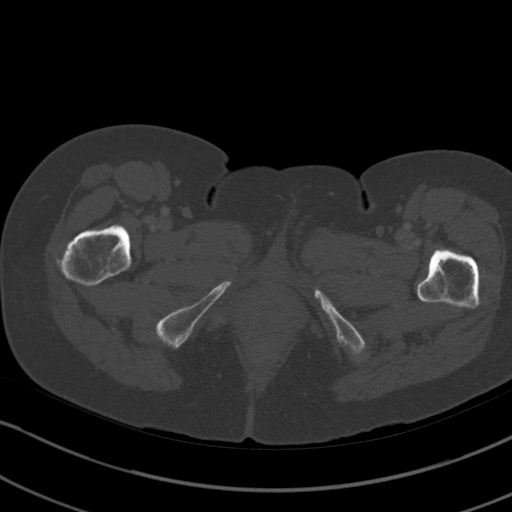
[im 52/222  soft-tissue]
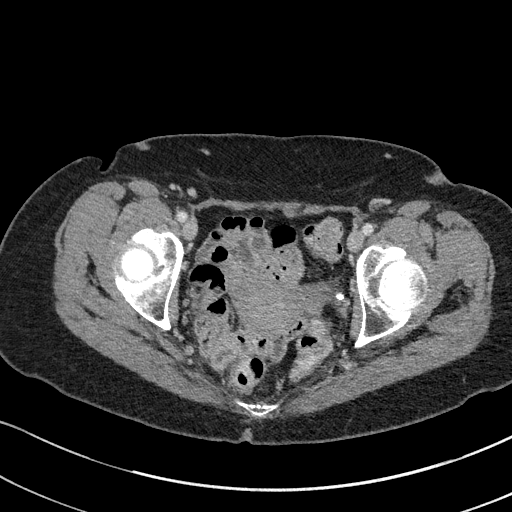
[im 69/222  soft-tissue]
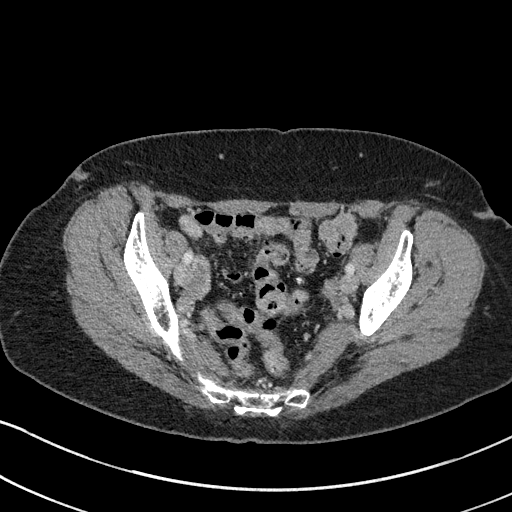
[im 103/222  soft-tissue]
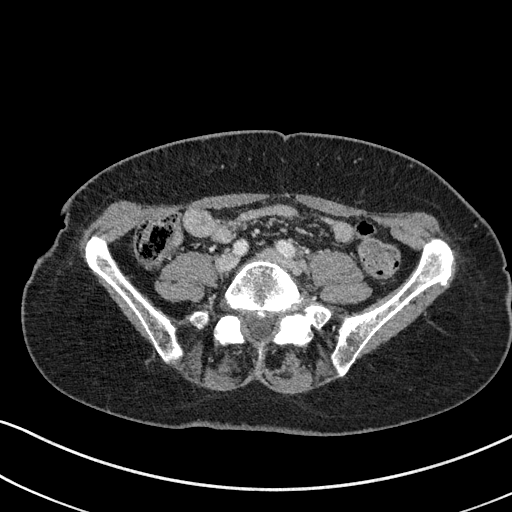
[im 120/222  soft-tissue]
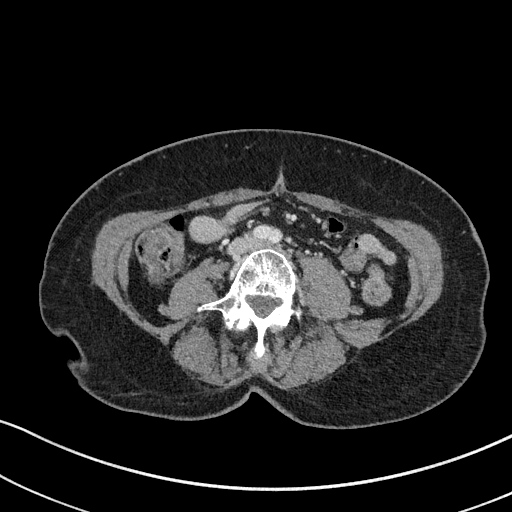
[im 154/222  soft-tissue]
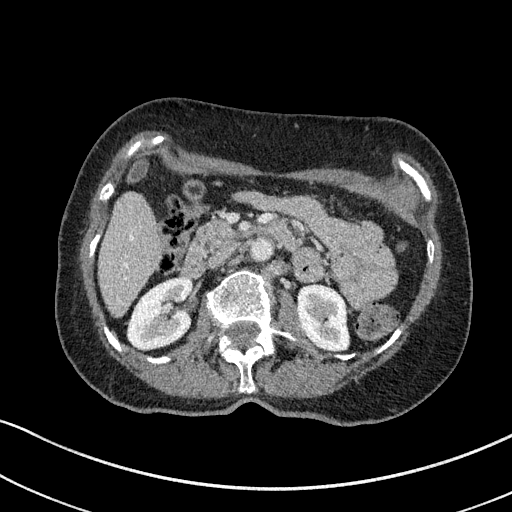
[im 154/222  lung]
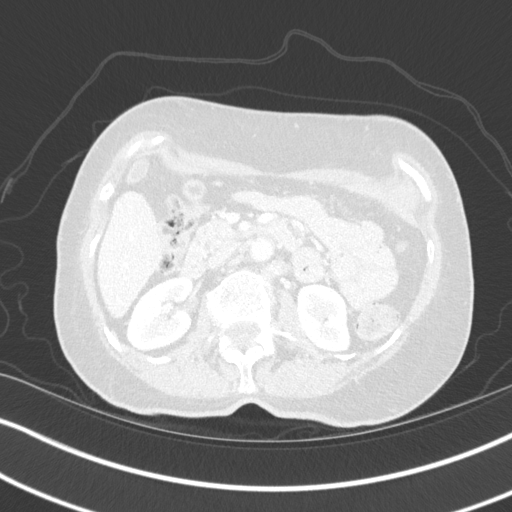
[im 171/222  soft-tissue]
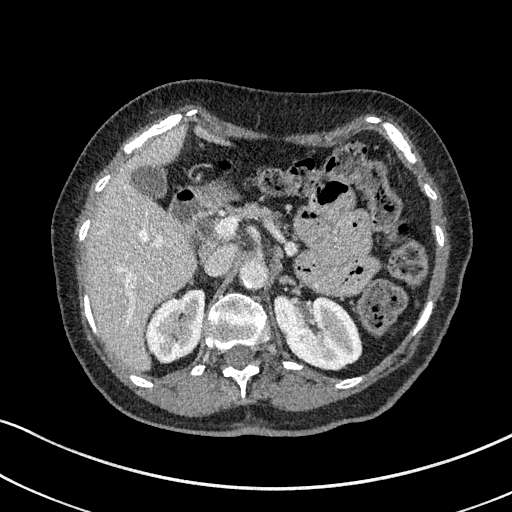
[im 171/222  lung]
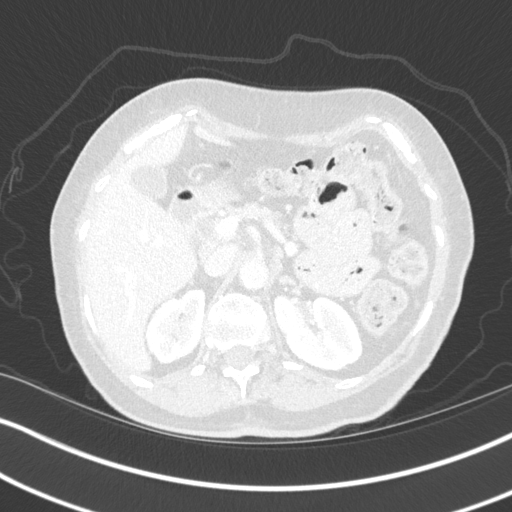
[im 188/222  lung]
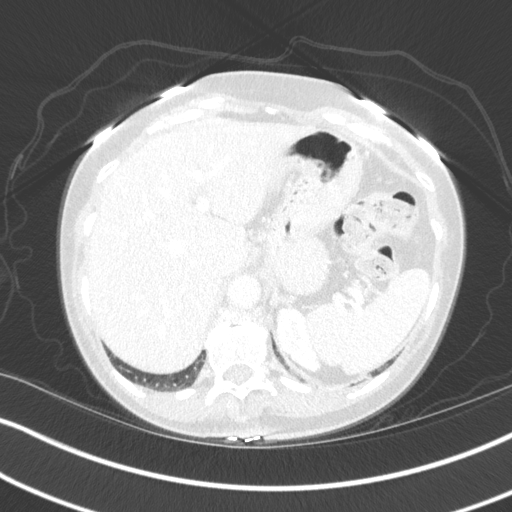
[im 205/222  soft-tissue]
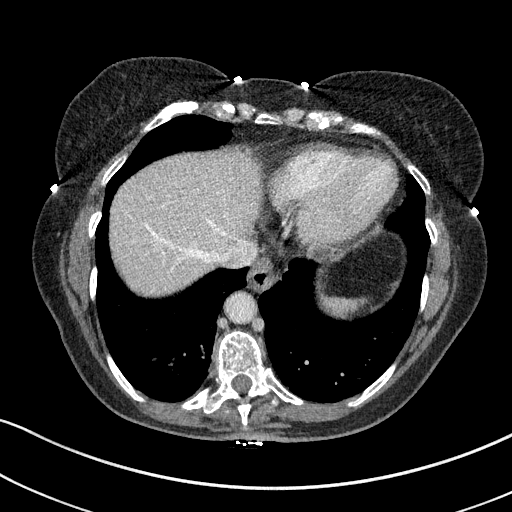
[im 205/222  lung]
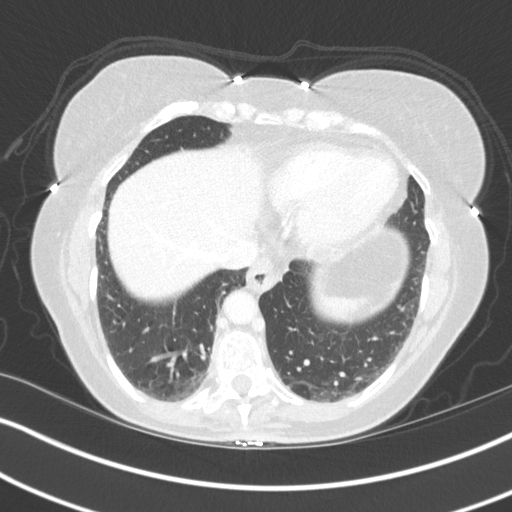

[Series 13: coronal venous mpr · coronal · portal-venous · 0.74mm/px · 2 of 114 slices shown]
[im 38/114  soft-tissue]
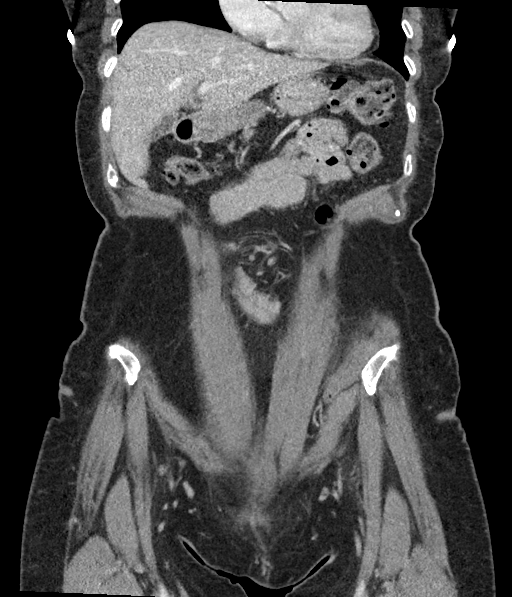
[im 76/114  soft-tissue]
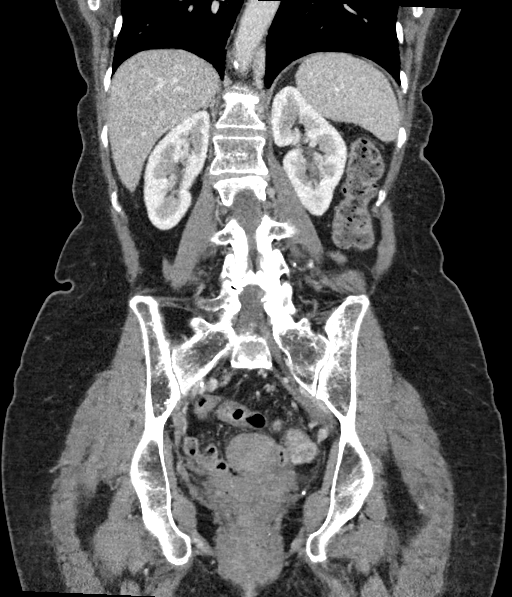

[11 of 46 positions shown; findings below may reference images not displayed]

FINDINGS: VASCULAR

Aorta: Normal caliber aorta with mild scattered calcified
atherosclerotic plaque. No evidence of dissection or ulceration.

Celiac: Patent without evidence of aneurysm, dissection, vasculitis
or significant stenosis. The left hepatic artery is replaced to the
left gastric artery.

SMA: Mild to moderate stenosis of the proximal superior mesenteric
artery measures approximately 25-50% in diameter.

Renals: Calcified plaque at the origin of the solitary right renal
artery without significant stenosis. There is a small accessory
artery to the lower pole of the right kidney. The main right renal
artery is patent without evidence of significant stenosis. No
evidence of aneurysm, dissection or fibromuscular dysplasia.

IMA: Patent without evidence of aneurysm, dissection, vasculitis or
significant stenosis.

Inflow: Patent without evidence of aneurysm, dissection, vasculitis
or significant stenosis.

Proximal Outflow: Bilateral common femoral and visualized portions
of the superficial and profunda femoral arteries are patent without
evidence of aneurysm, dissection, vasculitis or significant
stenosis.

Veins: No focal venous abnormality. The hepatic and portal veins are
widely patent as are the renal veins and mesenteric veins.

Review of the MIP images confirms the above findings.

NON-VASCULAR

Lower chest: The lung bases are clear. Visualized cardiac structures
are within normal limits for size. No pericardial effusion. Small
hiatal hernia.

Hepatobiliary: Normal hepatic contour and morphology. No discrete
hepatic lesions. Normal appearance of the gallbladder. No intra or
extrahepatic biliary ductal dilatation. Stable mild prominence of
the extrahepatic biliary duct without progression compared to recent
prior imaging.

Pancreas: Unremarkable. No pancreatic ductal dilatation or
surrounding inflammatory changes.

Spleen: Normal in size without focal abnormality.

Adrenals/Urinary Tract: Adrenal glands are unremarkable. Kidneys are
normal, without renal calculi, focal lesion, or hydronephrosis.
Bladder is unremarkable.

Stomach/Bowel: No focal bowel wall thickening or evidence of
obstruction. Unremarkable visualized stomach and duodenum.

Lymphatic: No suspicious lymphadenopathy.

Reproductive: Unremarkable appearance of the uterus. Subcentimeter
cystic structure affiliated with the left ovary remains stable
across multiple prior studies dating back to Monday February, 2017. The
right ovary is unremarkable.

Other: No abdominal wall hernia or abnormality. No abdominopelvic
ascites.

Musculoskeletal: No acute fracture or aggressive appearing lytic or
blastic osseous lesion. Multilevel degenerative disc disease most
notable at L4-L5 and L5-S1. Minimal grade 1 anterolisthesis is
present of L4 on L5.
IMPRESSION: VASCULAR

1. Mild to moderate focal stenosis of the proximal superior
mesenteric artery. In isolation, this lesion would be extremely
unlikely to result in symptoms of chronic mesenteric ischemia.
2. Otherwise, the celiac axis and inferior mesenteric artery are
widely patent without evidence of stenosis.
3. The left hepatic artery is replaced to the left gastric artery.
4.  Aortic Atherosclerosis (8VTR5-170.0)

NON-VASCULAR

1. Mild hiatal hernia.
2. Additional ancillary findings as above without significant
interval change compared to recent prior imaging.

## 2019-05-20 ENCOUNTER — Telehealth (INDEPENDENT_AMBULATORY_CARE_PROVIDER_SITE_OTHER): Payer: Self-pay

## 2019-05-20 NOTE — Telephone Encounter (Signed)
Patient called wanting to know what was going on with her angiogram. I explained to the patient that when she was last seen on 04/08/2019 by Eulogio Ditch NP,  she did not want to have intervention at this time so she was scheduled for mesenteric ultrasound in 06/18/19 patient understood.

## 2019-06-18 ENCOUNTER — Other Ambulatory Visit: Payer: Self-pay

## 2019-06-18 ENCOUNTER — Telehealth: Payer: Self-pay | Admitting: Obstetrics and Gynecology

## 2019-06-18 ENCOUNTER — Encounter (INDEPENDENT_AMBULATORY_CARE_PROVIDER_SITE_OTHER): Payer: Self-pay | Admitting: Vascular Surgery

## 2019-06-18 ENCOUNTER — Ambulatory Visit (INDEPENDENT_AMBULATORY_CARE_PROVIDER_SITE_OTHER): Payer: Medicare Other

## 2019-06-18 ENCOUNTER — Ambulatory Visit (INDEPENDENT_AMBULATORY_CARE_PROVIDER_SITE_OTHER): Payer: Medicare Other | Admitting: Vascular Surgery

## 2019-06-18 VITALS — BP 119/77 | HR 71 | Resp 12 | Ht 63.0 in | Wt 132.0 lb

## 2019-06-18 DIAGNOSIS — R1084 Generalized abdominal pain: Secondary | ICD-10-CM | POA: Diagnosis not present

## 2019-06-18 DIAGNOSIS — K551 Chronic vascular disorders of intestine: Secondary | ICD-10-CM

## 2019-06-18 DIAGNOSIS — E782 Mixed hyperlipidemia: Secondary | ICD-10-CM

## 2019-06-18 DIAGNOSIS — I480 Paroxysmal atrial fibrillation: Secondary | ICD-10-CM | POA: Diagnosis not present

## 2019-06-18 NOTE — Telephone Encounter (Signed)
Called pt to schedule an appt lmtrc  

## 2019-06-18 NOTE — Progress Notes (Signed)
MRN : [id number]  Jacqueline Powers is a 85 y.o. (1935-02-24) female who presents with chief complaint of  Chief Complaint  Patient presents with  . Follow-up    3 Mo Mesenteric  .  History of Present Illness: Patient returns today in follow up of her mesenteric disease.  Her weight has been stable to slightly improved.  Looks like she is gained a pound since her last visit with me.  She reports no new symptoms.  She still complains of a fair bit of vague abdominal pain.  She has managed her symptoms by eating more frequent smaller meals and that seems to be doing well.  Her duplex today does not have quite as elevated velocities in her SMA as seen previously on the ultrasound and the velocities would not fall in the high-grade range.  The celiac and IMA both have good flow.  Current Outpatient Medications  Medication Sig Dispense Refill  . alum & mag hydroxide-simeth (MAALOX PLUS) 400-400-40 MG/5ML suspension Take by mouth.    Marland Kitchen azelastine (ASTELIN) 0.1 % nasal spray Place into both nostrils 2 (two) times daily. Use in each nostril as directed    . cholecalciferol (VITAMIN D3) 25 MCG (1000 UNIT) tablet Take 1,000 Units by mouth daily. 2 times a day    . dorzolamide-timolol (COSOPT) 22.3-6.8 MG/ML ophthalmic solution Place 1 drop into the right eye 2 (two) times daily.    Marland Kitchen levothyroxine (SYNTHROID, LEVOTHROID) 25 MCG tablet Take 25 mcg by mouth daily before breakfast.     . LUMIGAN 0.01 % SOLN Place 1 drop into both eyes at bedtime.     . metoprolol succinate (TOPROL-XL) 25 MG 24 hr tablet TAKE 1 TABLET DAILY 90 tablet 3  . rivaroxaban (XARELTO) 20 MG TABS tablet Take 1 tablet (20 mg total) by mouth daily with supper. 90 tablet 3  . rosuvastatin (CRESTOR) 5 MG tablet TAKE 1 TABLET DAILY AT 6 P.M. 90 tablet 3  . diltiazem (CARDIZEM) 30 MG tablet Take 1 tablet (30 mg total) by mouth 3 (three) times daily as needed (As needed for fast heart rates.). (Patient not taking: Reported on 06/18/2019)  90 tablet 3  . ezetimibe (ZETIA) 10 MG tablet Take 10 mg by mouth daily.    . fluticasone (FLONASE) 50 MCG/ACT nasal spray Place 2 sprays into both nostrils at bedtime as needed for allergies.     Marland Kitchen omeprazole (PRILOSEC) 40 MG capsule Take 40 mg by mouth daily.    . pantoprazole (PROTONIX) 40 MG tablet Take 40 mg by mouth daily.     No current facility-administered medications for this visit.    Past Medical History:  Diagnosis Date  . Arthritis   . Asthma   . Carpal tunnel syndrome   . Colonic polyp   . Degenerative joint disease of cervical and lumbar spine   . Dysrhythmia   . GERD (gastroesophageal reflux disease)   . Glaucoma   . Hashimoto's thyroiditis    a. 05/2002 s/p resection of thyroid nodule-->on replacement.  . Hiatal hernia   . History of depression   . Hyperlipidemia   . Hypothyroidism   . Irritable bowel syndrome   . Osteoporosis   . PAF (paroxysmal atrial fibrillation) (HCC)    a. CHA2DS2VASc = 5-->xarelto;    . Perennial allergic rhinitis   . Right knee meniscal tear   . TIA (transient ischemic attack)    a. 06/2013: LUE/LLE wkns x 15 mins, MRI neg for CVA.  Past Surgical History:  Procedure Laterality Date  . BACK SURGERY     L5  . CATARACT EXTRACTION    . CERVICAL CONE BIOPSY    . CERVICAL FUSION    . COLONOSCOPY    . COLONOSCOPY WITH PROPOFOL N/A 01/20/2017   Procedure: COLONOSCOPY WITH PROPOFOL;  Surgeon: Scot Jun, MD;  Location: [facility] ENDOSCOPY;  Service: Endoscopy;  Laterality: N/A;  . ESOPHAGOGASTRODUODENOSCOPY (EGD) WITH PROPOFOL N/A 01/20/2017   Procedure: ESOPHAGOGASTRODUODENOSCOPY (EGD) WITH PROPOFOL;  Surgeon: Scot Jun, MD;  Location: [facility] ENDOSCOPY;  Service: Endoscopy;  Laterality: N/A;  . ESOPHAGOGASTRODUODENOSCOPY (EGD) WITH PROPOFOL N/A 04/28/2017   Procedure: ESOPHAGOGASTRODUODENOSCOPY (EGD) WITH PROPOFOL;  Surgeon: Scot Jun, MD;  Location: [facility] ENDOSCOPY;  Service: Endoscopy;  Laterality: N/A;     Social  History   Tobacco Use  . Smoking status: Never Smoker  . Smokeless tobacco: Never Used  Substance Use Topics  . Alcohol use: Yes    Comment: none last 24hrs  . Drug use: No    Family History  Problem Relation Age of Onset  . Hypertension Sister   no bleeding or clotting disorders No aneurysms   Allergies  Allergen Reactions  . Amoxicillin-Pot Clavulanate Nausea And Vomiting    Has patient had a PCN reaction causing immediate rash, facial/tongue/throat swelling, SOB or lightheadedness with hypotension: No Has patient had a PCN reaction causing severe rash involving mucus membranes or skin necrosis: No Has patient had a PCN reaction that required hospitalization: No Has patient had a PCN reaction occurring within the last 10 years: Yes  If all of the above answers are "NO", then may proceed with Cephalosporin use.   . Codeine Nausea And Vomiting  . Other Other (See Comments)    Other reaction(s): Unknown GLAUCOMA DROPS  . Septra [Sulfamethoxazole-Trimethoprim] Other (See Comments)    Reaction: unknown  . Simvastatin Other (See Comments)    "did something to muscles"  . Sulfa Antibiotics Other (See Comments)    Reaction: unknown   REVIEW OF SYSTEMS (Negative unless checked)  Constitutional: [x] ?Weight loss  [] ?Fever  [] ?Chills Cardiac: [] ?Chest pain   [] ?Chest pressure   [] ?Palpitations   [] ?Shortness of breath when laying flat   [] ?Shortness of breath at rest   [] ?Shortness of breath with exertion. Vascular:  [] ?Pain in legs with walking   [] ?Pain in legs at rest   [] ?Pain in legs when laying flat   [] ?Claudication   [] ?Pain in feet when walking  [] ?Pain in feet at rest  [] ?Pain in feet when laying flat   [] ?History of DVT   [] ?Phlebitis   [] ?Swelling in legs   [] ?Varicose veins   [] ?Non-healing ulcers Pulmonary:   [] ?Uses home oxygen   [] ?Productive cough   [] ?Hemoptysis   [] ?Wheeze  [] ?COPD   [] ?Asthma Neurologic:  [] ?Dizziness  [] ?Blackouts   [] ?Seizures   [] ?History of  stroke   [] ?History of TIA  [] ?Aphasia   [] ?Temporary blindness   [] ?Dysphagia   [] ?Weakness or numbness in arms   [] ?Weakness or numbness in legs Musculoskeletal:  [] ?Arthritis   [] ?Joint swelling   [] ?Joint pain   [] ?Low back pain Hematologic:  [] ?Easy bruising  [] ?Easy bleeding   [] ?Hypercoagulable state   [] ?Anemic  [] ?Hepatitis Gastrointestinal:  [] ?Blood in stool   [] ?Vomiting blood  [] ?Gastroesophageal reflux/heartburn   [x] ?Abdominal pain Genitourinary:  [] ?Chronic kidney disease   [] ?Difficult urination  [] ?Frequent urination  [] ?Burning with urination   [] ?Hematuria Skin:  [] ?Rashes   [] ?Ulcers   [] ?Wounds Psychological:  [] ?History  of anxiety   [] ? History of major depression.  Physical Examination  BP 119/77 (BP Location: Right Arm)   Pulse 71   Resp 12   Ht 5\' 3"  (1.6 m)   Wt 132 lb (59.9 kg)   BMI 23.38 kg/m  Gen:  WD/WN, NAD.  Appears younger than stated age Head: /AT, No temporalis wasting. Ear/Nose/Throat: Hearing grossly intact, nares w/o erythema or drainage Eyes: Conjunctiva clear. Sclera non-icteric Neck: Supple.  Trachea midline Pulmonary:  Good air movement, no use of accessory muscles.  Cardiac: RRR, no JVD Vascular:  Vessel Right Left  Radial Palpable Palpable                                   Gastrointestinal: soft, mild tenderness to palpation diffusely throughout the abdomen. No guarding/reflex.  Musculoskeletal: M/S 5/5 throughout.  No deformity or atrophy.  No edema. Neurologic: Sensation grossly intact in extremities.  Symmetrical.  Speech is fluent.  Psychiatric: Judgment intact, Mood & affect appropriate for pt's clinical situation. Dermatologic: No rashes or ulcers noted.  No cellulitis or open wounds.       Labs No results found for this or any previous visit (from the past 2160 hour(s)).  Radiology No results found.  Assessment/Plan  Abdominal pain May be multifactorial, but certainly perfusion may be playing a role.   Symptoms are stable.  Atrial fibrillation On anticoagulation  Hyperlipidemia lipid control important in reducing the progression of atherosclerotic disease. Continue statin therapy   Chronic mesenteric ischemia (HCC) Her duplex today does not have quite as elevated velocities in her SMA as seen previously on the ultrasound and the velocities would not fall in the high-grade range.  The celiac and IMA both have good flow. At this point, with stable symptoms and these duplex findings I would not recommend proceeding with any intervention.  If she has worsening symptoms that is certainly something we can consider in the future.  I will plan a 91-month follow-up with noninvasive studies.    Leotis Pain, MD  06/18/2019 9:03 AM    This note was created with Dragon medical transcription system.  Any errors from dictation are purely unintentional

## 2019-06-18 NOTE — Assessment & Plan Note (Signed)
lipid control important in reducing the progression of atherosclerotic disease. Continue statin therapy  

## 2019-06-18 NOTE — Assessment & Plan Note (Signed)
May be multifactorial, but certainly perfusion may be playing a role.  Symptoms are stable.

## 2019-06-18 NOTE — Assessment & Plan Note (Signed)
On anticoagulation 

## 2019-06-18 NOTE — Assessment & Plan Note (Signed)
Her duplex today does not have quite as elevated velocities in her SMA as seen previously on the ultrasound and the velocities would not fall in the high-grade range.  The celiac and IMA both have good flow. At this point, with stable symptoms and these duplex findings I would not recommend proceeding with any intervention.  If she has worsening symptoms that is certainly something we can consider in the future.  I will plan a 61-month follow-up with noninvasive studies.

## 2019-07-22 ENCOUNTER — Other Ambulatory Visit: Payer: Self-pay | Admitting: Student

## 2019-07-22 DIAGNOSIS — R140 Abdominal distension (gaseous): Secondary | ICD-10-CM

## 2019-07-22 DIAGNOSIS — R102 Pelvic and perineal pain: Secondary | ICD-10-CM

## 2019-07-23 ENCOUNTER — Encounter (INDEPENDENT_AMBULATORY_CARE_PROVIDER_SITE_OTHER): Payer: Medicare Other

## 2019-07-23 ENCOUNTER — Ambulatory Visit (INDEPENDENT_AMBULATORY_CARE_PROVIDER_SITE_OTHER): Payer: Medicare Other | Admitting: Vascular Surgery

## 2019-07-30 ENCOUNTER — Ambulatory Visit
Admission: RE | Admit: 2019-07-30 | Discharge: 2019-07-30 | Disposition: A | Discharge status: Home / Self Care | Payer: Medicare Other | Source: Ambulatory Visit | Attending: Student | Admitting: Student

## 2019-07-30 ENCOUNTER — Other Ambulatory Visit: Payer: Self-pay

## 2019-07-30 DIAGNOSIS — R140 Abdominal distension (gaseous): Secondary | ICD-10-CM | POA: Insufficient documentation

## 2019-07-30 DIAGNOSIS — R102 Pelvic and perineal pain: Secondary | ICD-10-CM | POA: Insufficient documentation

## 2019-10-09 ENCOUNTER — Other Ambulatory Visit: Payer: Self-pay

## 2019-10-09 ENCOUNTER — Ambulatory Visit (INDEPENDENT_AMBULATORY_CARE_PROVIDER_SITE_OTHER): Payer: Medicare Other | Admitting: Obstetrics and Gynecology

## 2019-10-09 ENCOUNTER — Encounter: Payer: Self-pay | Admitting: Obstetrics and Gynecology

## 2019-10-09 ENCOUNTER — Other Ambulatory Visit (HOSPITAL_COMMUNITY)
Admission: RE | Admit: 2019-10-09 | Discharge: 2019-10-09 | Disposition: A | Discharge status: Home / Self Care | Payer: Medicare Other | Source: Ambulatory Visit | Attending: Obstetrics and Gynecology | Admitting: Obstetrics and Gynecology

## 2019-10-09 VITALS — BP 143/80 | HR 84 | Ht 63.0 in | Wt 133.9 lb

## 2019-10-09 DIAGNOSIS — Z780 Asymptomatic menopausal state: Secondary | ICD-10-CM | POA: Diagnosis not present

## 2019-10-09 DIAGNOSIS — Z124 Encounter for screening for malignant neoplasm of cervix: Secondary | ICD-10-CM | POA: Insufficient documentation

## 2019-10-09 DIAGNOSIS — Z01419 Encounter for gynecological examination (general) (routine) without abnormal findings: Secondary | ICD-10-CM | POA: Diagnosis not present

## 2019-10-09 DIAGNOSIS — Z1151 Encounter for screening for human papillomavirus (HPV): Secondary | ICD-10-CM | POA: Insufficient documentation

## 2019-10-09 NOTE — Progress Notes (Signed)
HPI:      Jacqueline Powers is a 85 y.o. [phone] who LMP was No LMP recorded. Patient is postmenopausal.  Subjective:   She presents today for her annual examination.  She is not been seen here in 3 years.  She has no GYN complaints.  She recently had a pelvic ultrasound which showed resolution of her previous ovarian cyst.  She has had a mammogram and within the last year which was normal.  She is requesting to stop having GYN exams unless she has symptoms. Remote history of LEEP for CIN. -Patient requesting Pap smear.    Hx: The following portions of the patient's history were reviewed and updated as appropriate:             She  has a past medical history of Arthritis, Asthma, Carpal tunnel syndrome, Colonic polyp, Degenerative joint disease of cervical and lumbar spine, Dysrhythmia, GERD (gastroesophageal reflux disease), Glaucoma, Hashimoto's thyroiditis, Hiatal hernia, History of depression, Hyperlipidemia, Hypothyroidism, Irritable bowel syndrome, Osteoporosis, PAF (paroxysmal atrial fibrillation) (HCC), Perennial allergic rhinitis, Right knee meniscal tear, and TIA (transient ischemic attack). She does not have any pertinent problems on file. She  has a past surgical history that includes Cervical fusion; Back surgery; Colonoscopy; Cervical cone biopsy; Cataract extraction; Colonoscopy with propofol (N/A, 01/20/2017); Esophagogastroduodenoscopy (egd) with propofol (N/A, 01/20/2017); and Esophagogastroduodenoscopy (egd) with propofol (N/A, 04/28/2017). Her family history includes Hypertension in her sister. She  reports that she has never smoked. She has never used smokeless tobacco. She reports current alcohol use. She reports that she does not use drugs. She has a current medication list which includes the following prescription(s): azelastine, cholecalciferol, dorzolamide-timolol, ezetimibe, fluticasone, levothyroxine, lumigan, metoprolol succinate, rivaroxaban, rosuvastatin, alum & mag  hydroxide-simeth, diltiazem, omeprazole, and pantoprazole. She is allergic to amoxicillin-pot clavulanate; codeine; other; septra [sulfamethoxazole-trimethoprim]; simvastatin; and sulfa antibiotics.       Review of Systems:  Review of Systems  Constitutional: Denied constitutional symptoms, night sweats, recent illness, fatigue, fever, insomnia and weight loss.  Eyes: Denied eye symptoms, eye pain, photophobia, vision change and visual disturbance.  Ears/Nose/Throat/Neck: Denied ear, nose, throat or neck symptoms, hearing loss, nasal discharge, sinus congestion and sore throat.  Cardiovascular: Denied cardiovascular symptoms, arrhythmia, chest pain/pressure, edema, exercise intolerance, orthopnea and palpitations.  Respiratory: Denied pulmonary symptoms, asthma, pleuritic pain, productive sputum, cough, dyspnea and wheezing.  Gastrointestinal: Denied, gastro-esophageal reflux, melena, nausea and vomiting.  Genitourinary: Denied genitourinary symptoms including symptomatic vaginal discharge, pelvic relaxation issues, and urinary complaints.  Musculoskeletal: Denied musculoskeletal symptoms, stiffness, swelling, muscle weakness and myalgia.  Dermatologic: Denied dermatology symptoms, rash and scar.  Neurologic: Denied neurology symptoms, dizziness, headache, neck pain and syncope.  Psychiatric: Denied psychiatric symptoms, anxiety and depression.  Endocrine: Denied endocrine symptoms including hot flashes and night sweats.   Meds:   Current Outpatient Medications on File Prior to Visit  Medication Sig Dispense Refill  . azelastine (ASTELIN) 0.1 % nasal spray Place into both nostrils 2 (two) times daily. Use in each nostril as directed    . cholecalciferol (VITAMIN D3) 25 MCG (1000 UNIT) tablet Take 1,000 Units by mouth daily. 2 times a day    . dorzolamide-timolol (COSOPT) 22.3-6.8 MG/ML ophthalmic solution Place 1 drop into the right eye 2 (two) times daily.    Marland Kitchen ezetimibe (ZETIA) 10 MG  tablet Take 10 mg by mouth daily.    . fluticasone (FLONASE) 50 MCG/ACT nasal spray Place 2 sprays into both nostrils at bedtime as needed for allergies.     Marland Kitchen  levothyroxine (SYNTHROID, LEVOTHROID) 25 MCG tablet Take 25 mcg by mouth daily before breakfast.     . LUMIGAN 0.01 % SOLN Place 1 drop into both eyes at bedtime.     . metoprolol succinate (TOPROL-XL) 25 MG 24 hr tablet TAKE 1 TABLET DAILY 90 tablet 3  . rivaroxaban (XARELTO) 20 MG TABS tablet Take 1 tablet (20 mg total) by mouth daily with supper. 90 tablet 3  . rosuvastatin (CRESTOR) 5 MG tablet TAKE 1 TABLET DAILY AT 6 P.M. 90 tablet 3  . alum & mag hydroxide-simeth (MAALOX PLUS) 400-400-40 MG/5ML suspension Take by mouth.    . diltiazem (CARDIZEM) 30 MG tablet Take 1 tablet (30 mg total) by mouth 3 (three) times daily as needed (As needed for fast heart rates.). (Patient not taking: Reported on 10/09/2019) 90 tablet 3  . omeprazole (PRILOSEC) 40 MG capsule Take 40 mg by mouth daily.    . pantoprazole (PROTONIX) 40 MG tablet Take 40 mg by mouth daily.     No current facility-administered medications on file prior to visit.    Objective:     Vitals:   10/09/19 1013  BP: (!) 143/80  Pulse: 84              Physical examination General NAD, Conversant  HEENT Atraumatic; Op clear with mmm.  Normo-cephalic. Pupils reactive. Anicteric sclerae  Thyroid/Neck Smooth without nodularity or enlargement. Normal ROM.  Neck Supple.  Skin No rashes, lesions or ulceration. Normal palpated skin turgor. No nodularity.  Breasts: No masses or discharge.  Symmetric.  No axillary adenopathy.  Lungs: Clear to auscultation.No rales or wheezes. Normal Respiratory effort, no retractions.  Heart: NSR.  No murmurs or rubs appreciated. No periferal edema  Abdomen: Soft.  Non-tender.  No masses.  No HSM. No hernia  Extremities: Moves all appropriately.  Normal ROM for age. No lymphadenopathy.  Neuro: Oriented to PPT.  Normal mood. Normal affect.      Pelvic:   Vulva: Normal appearance.  No lesions.  Vagina: No lesions or abnormalities noted.  Significant vaginal atrophy  Support: Normal pelvic support.  Urethra No masses tenderness or scarring.  Meatus Normal size without lesions or prolapse.  Cervix: Normal appearance.  No lesions.  Obviously status post procedure.  Anus: Normal exam.  No lesions.  Perineum: Normal exam.  No lesions.        Bimanual   Uterus: Normal size.  Non-tender.  Mobile.  AV.  Adnexae: No masses.  Non-tender to palpation.  Cul-de-sac: Negative for abnormality.      Assessment:    [id number] Patient Active Problem List   Diagnosis Date Noted  . Chronic mesenteric ischemia ([place]) 04/08/2019  . Aortic atherosclerosis ([place]) 03/22/2019  . Abdominal pain 03/22/2019  . Hypothyroidism due to Hashimoto's thyroiditis 06/29/2018  . Osteopenia of multiple sites 06/29/2018  . AKI (acute kidney injury) ([place]) 08/23/2017  . Closed fracture of upper end of humerus 10/28/2016  . PAF (paroxysmal atrial fibrillation) ([place])   . Chronic asthma, mild persistent, uncomplicated 54/62/7035  . Osteoporosis 04/07/2015  . Encounter for anticoagulation discussion and counseling 11/10/2014  . Atrial fibrillation ([place]) 08/04/2014  . Tachycardia 06/23/2014  . TIA (transient ischemic attack) 06/23/2014  . Hashimoto's thyroiditis 06/23/2014  . Jaw pain, non-TMJ 06/23/2014  . Neck pain 06/23/2014  . Glaucoma 12/02/2012  . Sinusitis 12/02/2012  . Vitreous floaters of left eye 12/02/2012  . Chest pain 08/17/2012  . Hyperlipidemia 08/17/2012     1. Well woman exam with  routine gynecological exam     Patient with no GYN problems at this time.  If her Pap is normal likely needs no further Paps or mammograms. (Aged out)   Plan:            1.  Basic Screening Recommendations The basic screening recommendations for asymptomatic women were discussed with the patient during her visit.  The age-appropriate recommendations were  discussed with her and the rational for the tests reviewed.  When I am informed by the patient that another primary care physician has previously obtained the age-appropriate tests and they are up-to-date, only outstanding tests are ordered and referrals given as necessary.  Abnormal results of tests will be discussed with her when all of her results are completed.  Routine preventative health maintenance measures emphasized: Exercise/Diet/Weight control, Tobacco Warnings, Alcohol/Substance use risks and Stress Management Pap cotest performed -patient up-to-date on mammography Orders No orders of the defined types were placed in this encounter.   No orders of the defined types were placed in this encounter.       F/U  Return for F/U  - Patient's choice.  Elonda Husky, M.D. 10/09/2019 10:49 AM

## 2019-10-09 NOTE — Addendum Note (Signed)
Addended by: Dorian Pod on: 10/09/2019 11:25 AM   Modules accepted: Orders

## 2019-10-10 LAB — CYTOLOGY - PAP
Comment: NEGATIVE
Diagnosis: NEGATIVE
High risk HPV: NEGATIVE

## 2019-11-23 ENCOUNTER — Other Ambulatory Visit: Payer: Self-pay | Admitting: Cardiovascular Disease

## 2019-11-27 ENCOUNTER — Other Ambulatory Visit: Payer: Self-pay | Admitting: Cardiovascular Disease

## 2019-11-27 ENCOUNTER — Other Ambulatory Visit: Payer: Self-pay

## 2019-11-27 DIAGNOSIS — I480 Paroxysmal atrial fibrillation: Secondary | ICD-10-CM

## 2019-11-27 DIAGNOSIS — Z7901 Long term (current) use of anticoagulants: Secondary | ICD-10-CM

## 2019-11-27 NOTE — Telephone Encounter (Addendum)
Pt's wt at last ov was 60.7 kg, age 85, last ov w/ TG 12/05/18. Pt is due for f/u w/ TG and overdue for labs. Order placed for CBC & BMET to check kidney fx.  Pt is >26 y/o; if her wt is <60 kg, she will need reduced dosage of Xarelto.

## 2019-11-27 NOTE — Telephone Encounter (Signed)
Refill request

## 2019-12-09 NOTE — Progress Notes (Signed)
Cardiology Office Note  Date:  12/10/2019   ID:  Jacqueline Powers, Jacqueline Powers [birth year], MRN [id number]  PCP:  Gracelyn Nurse, MD   Chief Complaint  Patient presents with  . office visit    Pt has swelling/ fatigue/ feeling a-fib at bedtime at times. Meds verbally reviewed w/ pt.    HPI:  Jacqueline Powers is a pleasant 85 year old woman with a history of  hyperlipidemia,  Hashimoto's thyroiditis with elevated TSH, osteoporosis,  remote history of chest pain,  GERD,  previously evaluated for jaw pain, neck pain, tachycardia CT coronary calcium score (score of 17) history of neck  surgery, fusion  30 day monitor documented atrial fibrillation.  Prior TIA She presents today for follow-up of her atrial fibrillation   In follow-up today she continues to have abdominal discomfort Going to vascular department, who has been following concern for mesenteric ischemia Arteries in digestive system "not enough blood" Seen by Dr. Wyn Quaker, ABD u/s , Minimal atherosclerosis in the abdominal aorta.  Several CT scans abdomen review on today's visit dating back 2018, 2019, 2020 documenting moderate stenosis of mesenteric vessels  Medication regimen for cholesterol reviewed with her On zetia, crestor (once every three days) side effects daily Total chol 220, LDL 141, Numbers above goal  Has diltiazem 30 mg pill as needed for atrial fib Rare palpitations at nighttime  EKG personally reviewed by myself on todays visit Normal sinus rhythm rate 67 bpm no significant ST-T wave changes, no change from prior EKGs  Other past medical history reviewed visit in the emergency room 03/26/16: atrial fib rate 150 bpm She was sleeping when she was woken up by severe tachycardia EKG showing atrial fibrillation, converted to NSR w/o intervention  PreviousCT scan imaging reviewed with her showing mild coronary calcification, mild descending aortic calcification    PMH:   has a past medical history of Arthritis,  Asthma, Carpal tunnel syndrome, Colonic polyp, Degenerative joint disease of cervical and lumbar spine, Dysrhythmia, GERD (gastroesophageal reflux disease), Glaucoma, Hashimoto's thyroiditis, Hiatal hernia, History of depression, Hyperlipidemia, Hypothyroidism, Irritable bowel syndrome, Osteoporosis, PAF (paroxysmal atrial fibrillation) (HCC), Perennial allergic rhinitis, Right knee meniscal tear, and TIA (transient ischemic attack).  PSH:    Past Surgical History:  Procedure Laterality Date  . BACK SURGERY     L5  . CATARACT EXTRACTION    . CERVICAL CONE BIOPSY    . CERVICAL FUSION    . COLONOSCOPY    . COLONOSCOPY WITH PROPOFOL N/A 01/20/2017   Procedure: COLONOSCOPY WITH PROPOFOL;  Surgeon: Scot Jun, MD;  Location: [facility] ENDOSCOPY;  Service: Endoscopy;  Laterality: N/A;  . ESOPHAGOGASTRODUODENOSCOPY (EGD) WITH PROPOFOL N/A 01/20/2017   Procedure: ESOPHAGOGASTRODUODENOSCOPY (EGD) WITH PROPOFOL;  Surgeon: Scot Jun, MD;  Location[facility] ENDOSCOPY;  Service: Endoscopy;  Laterality: N/A;  . ESOPHAGOGASTRODUODENOSCOPY (EGD) WITH PROPOFOL N/A 04/28/2017   Procedure: ESOPHAGOGASTRODUODENOSCOPY (EGD) WITH PROPOFOL;  Surgeon: Scot Jun, MD;  Location: [facility] ENDOSCOPY;  Service: Endoscopy;  Laterality: N/A;    Current Outpatient Medications  Medication Sig Dispense Refill  . alum & mag hydroxide-simeth (MAALOX PLUS) 400-400-40 MG/5ML suspension Take by mouth.    Marland Kitchen azelastine (ASTELIN) 0.1 % nasal spray Place into both nostrils 2 (two) times daily. Use in each nostril as directed    . cholecalciferol (VITAMIN D3) 25 MCG (1000 UNIT) tablet Take 1,000 Units by mouth daily. 2 times a day    . diltiazem (CARDIZEM) 30 MG tablet Take 1 tablet (30 mg total) by mouth 3 (three)  times daily as needed (As needed for fast heart rates.). 90 tablet 3  . dorzolamide-timolol (COSOPT) 22.3-6.8 MG/ML ophthalmic solution Place 1 drop into the right eye 2 (two) times daily.    Marland Kitchen ezetimibe (ZETIA)  10 MG tablet Take 10 mg by mouth daily.    . fluticasone (FLONASE) 50 MCG/ACT nasal spray Place 2 sprays into both nostrils at bedtime as needed for allergies.     Marland Kitchen levothyroxine (SYNTHROID, LEVOTHROID) 25 MCG tablet Take 25 mcg by mouth daily before breakfast.     . LUMIGAN 0.01 % SOLN Place 1 drop into both eyes at bedtime.     . metoprolol succinate (TOPROL-XL) 25 MG 24 hr tablet TAKE 1 TABLET BY MOUTH  DAILY 90 tablet 0  . omeprazole (PRILOSEC) 40 MG capsule Take 40 mg by mouth daily.    . pantoprazole (PROTONIX) 40 MG tablet Take 40 mg by mouth daily.    . rosuvastatin (CRESTOR) 5 MG tablet TAKE 1 TABLET DAILY AT 6 P.M. 90 tablet 3  . XARELTO 20 MG TABS tablet TAKE 1 TABLET BY MOUTH  DAILY WITH SUPPER 30 tablet 0   No current facility-administered medications for this visit.     Allergies:   Amoxicillin-pot clavulanate, Codeine, Other, Septra [sulfamethoxazole-trimethoprim], Simvastatin, and Sulfa antibiotics   Social History:  The patient  reports that she has never smoked. She has never used smokeless tobacco. She reports current alcohol use. She reports that she does not use drugs.   Family History:   family history includes Hypertension in her sister.    Review of Systems: Review of Systems  Constitutional: Negative.   Respiratory: Negative.   Cardiovascular: Negative.        Tachycardia  Gastrointestinal: Negative.   Musculoskeletal: Negative.   Neurological: Negative.   Psychiatric/Behavioral: Negative.   All other systems reviewed and are negative.    PHYSICAL EXAM: VS:  BP 130/82 (BP Location: Left Arm, Patient Position: Sitting, Cuff Size: Normal)   Pulse 67   Wt 130 lb 4 oz (59.1 kg)   SpO2 98%   BMI 23.07 kg/m  , BMI Body mass index is 23.07 kg/m. Constitutional:  oriented to person, place, and time. No distress.  HENT:  Head: Normocephalic and atraumatic.  Eyes:  no discharge. No scleral icterus.  Neck: Normal range of motion. Neck supple. No JVD  present.  Cardiovascular: Normal rate, regular rhythm, normal heart sounds and intact distal pulses. Exam reveals no gallop and no friction rub. No edema No murmur heard. Pulmonary/Chest: Effort normal and breath sounds normal. No stridor. No respiratory distress.  no wheezes.  no rales.  no tenderness.  Abdominal: Soft.  no distension.  no tenderness.  Musculoskeletal: Normal range of motion.  no  tenderness or deformity.  Neurological:  normal muscle tone. Coordination normal. No atrophy Skin: Skin is warm and dry. No rash noted. not diaphoretic.  Psychiatric:  normal mood and affect. behavior is normal. Thought content normal.   Recent Labs: No results found for requested labs within last 8760 hours.    Lipid Panel Lab Results  Component Value Date   CHOL 140 09/08/2016   HDL 62 09/08/2016   LDLCALC 64 09/08/2016   TRIG 71 09/08/2016      Wt Readings from Last 3 Encounters:  12/10/19 130 lb 4 oz (59.1 kg)  10/09/19 133 lb 14.4 oz (60.7 kg)  06/18/19 132 lb (59.9 kg)     ASSESSMENT AND PLAN:   Paroxysmal atrial fibrillation (HCC) -  Plan: EKG 12-Lead Diltiazem for breakthrough arrhythmia, stay on metoprolol and anticoagulation Otherwise maintaining normal sinus rhythm, stable Tolerating anticoagulation  Other specified transient cerebral ischemias On anticoagulation Prior TIAs, no recent TIA or stroke symptoms Recommended aggressive lipid control  Hyperlipidemia/peripheral arterial disease Tolerating Crestor once every 3 days with Zetia, numbers not at goal given her PAD Recommended she look into Repatha or Praluent, we will send in a prescription if she is interested Goal LDL less than 70  PAD Long discussion concerning mesenteric disease, need for more aggressive lipid panel Discussed prior CT scans with her, she has follow-up with Dr. Wyn Quaker Goal LDL less than 70 We will work with her on getting this down to goal  Chest pain, unspecified chest pain  type Currently with no symptoms of angina. No further workup at this time. Continue current medication regimen. No strong indication for repeat scanning of her heart given minimal coronary calcification 4 years ago No new anginal symptoms  Encounter for anticoagulation discussion and counseling Tolerating Xarelto 20 mg daily    Total encounter time more than 25 minutes  Greater than 50% was spent in counseling and coordination of care with the patient     No orders of the defined types were placed in this encounter.    Signed, Dossie [place], M.D., Ph.D. 12/10/2019  [facility] [place], [place] [phone]

## 2019-12-10 ENCOUNTER — Ambulatory Visit (INDEPENDENT_AMBULATORY_CARE_PROVIDER_SITE_OTHER): Payer: Medicare Other | Admitting: Cardiovascular Disease

## 2019-12-10 ENCOUNTER — Other Ambulatory Visit: Payer: Self-pay

## 2019-12-10 ENCOUNTER — Encounter: Payer: Self-pay | Admitting: Cardiovascular Disease

## 2019-12-10 VITALS — BP 130/82 | HR 67 | Wt 130.2 lb

## 2019-12-10 DIAGNOSIS — G459 Transient cerebral ischemic attack, unspecified: Secondary | ICD-10-CM

## 2019-12-10 DIAGNOSIS — R0789 Other chest pain: Secondary | ICD-10-CM

## 2019-12-10 DIAGNOSIS — E782 Mixed hyperlipidemia: Secondary | ICD-10-CM | POA: Diagnosis not present

## 2019-12-10 DIAGNOSIS — I480 Paroxysmal atrial fibrillation: Secondary | ICD-10-CM

## 2019-12-10 NOTE — Patient Instructions (Signed)
Please read about repatha/praluent for cholesterol injection  Medication Instructions:  No changes  If you need a refill on your cardiac medications before your next appointment, please call your pharmacy.    Lab work: No new labs needed   If you have labs (blood work) drawn today and your tests are completely normal, you will receive your results only by: Marland Kitchen MyChart Message (if you have MyChart) OR . A paper copy in the mail If you have any lab test that is abnormal or we need to change your treatment, we will call you to review the results.   Testing/Procedures: No new testing needed   Follow-Up: At [facility], you and your health needs are our priority.  As part of our continuing mission to provide you with exceptional heart care, we have created designated Provider Care Teams.  These Care Teams include your primary Cardiologist (physician) and Advanced Practice Providers (APPs -  Physician Assistants and Nurse Practitioners) who all work together to provide you with the care you need, when you need it.  . You will need a follow up appointment in 12 months   . Providers on your designated Care Team:   . Nicolasa Ducking, NP . Eula Listen, PA-C . Marisue Ivan, PA-C  Any Other Special Instructions Will Be Listed Below (If Applicable).  For educational health videos Log in to : [email or web address] Or : [email or web address], password : [email or web address]

## 2019-12-17 ENCOUNTER — Encounter (INDEPENDENT_AMBULATORY_CARE_PROVIDER_SITE_OTHER): Payer: Self-pay | Admitting: Vascular Surgery

## 2019-12-17 ENCOUNTER — Ambulatory Visit (INDEPENDENT_AMBULATORY_CARE_PROVIDER_SITE_OTHER): Payer: Medicare Other | Admitting: Vascular Surgery

## 2019-12-17 ENCOUNTER — Ambulatory Visit (INDEPENDENT_AMBULATORY_CARE_PROVIDER_SITE_OTHER): Payer: Medicare Other

## 2019-12-17 VITALS — BP 127/75 | HR 60 | Resp 16 | Wt 128.6 lb

## 2019-12-17 DIAGNOSIS — I480 Paroxysmal atrial fibrillation: Secondary | ICD-10-CM

## 2019-12-17 DIAGNOSIS — R1084 Generalized abdominal pain: Secondary | ICD-10-CM

## 2019-12-17 DIAGNOSIS — K551 Chronic vascular disorders of intestine: Secondary | ICD-10-CM | POA: Diagnosis not present

## 2019-12-17 DIAGNOSIS — E782 Mixed hyperlipidemia: Secondary | ICD-10-CM

## 2019-12-17 NOTE — Progress Notes (Signed)
MRN : [id number]  Jacqueline Powers is a 85 y.o. (1935-01-30) female who presents with chief complaint of  Chief Complaint  Patient presents with  . Follow-up    ultrasound follow up  .  History of Present Illness: Patient returns today in follow up of her mesenteric disease.  She is eating well and has no current complaints with unintentional weight loss or food fear.  She does still have intermittent abdominal pain.  Her duplex today shows normal velocities throughout the celiac, SMA, and IMA without hemodynamically significant stenosis unchanged from her study 6 months ago.  Current Outpatient Medications  Medication Sig Dispense Refill  . alum & mag hydroxide-simeth (MAALOX PLUS) 400-400-40 MG/5ML suspension Take by mouth.    Marland Kitchen azelastine (ASTELIN) 0.1 % nasal spray Place into both nostrils 2 (two) times daily. Use in each nostril as directed    . cholecalciferol (VITAMIN D3) 25 MCG (1000 UNIT) tablet Take 1,000 Units by mouth daily. 2 times a day    . diltiazem (CARDIZEM) 30 MG tablet Take 1 tablet (30 mg total) by mouth 3 (three) times daily as needed (As needed for fast heart rates.). 90 tablet 3  . dorzolamide-timolol (COSOPT) 22.3-6.8 MG/ML ophthalmic solution Place 1 drop into the right eye 2 (two) times daily.    Marland Kitchen ezetimibe (ZETIA) 10 MG tablet Take 10 mg by mouth daily.    . fluticasone (FLONASE) 50 MCG/ACT nasal spray Place 2 sprays into both nostrils at bedtime as needed for allergies.     Marland Kitchen levothyroxine (SYNTHROID, LEVOTHROID) 25 MCG tablet Take 25 mcg by mouth daily before breakfast.     . LUMIGAN 0.01 % SOLN Place 1 drop into both eyes at bedtime.     . metoprolol succinate (TOPROL-XL) 25 MG 24 hr tablet TAKE 1 TABLET BY MOUTH  DAILY 90 tablet 0  . omeprazole (PRILOSEC) 40 MG capsule Take 40 mg by mouth as needed.     . rosuvastatin (CRESTOR) 5 MG tablet TAKE 1 TABLET DAILY AT 6 P.M. 90 tablet 3  . XARELTO 20 MG TABS tablet TAKE 1 TABLET BY MOUTH  DAILY WITH SUPPER 30  tablet 0   No current facility-administered medications for this visit.    Past Medical History:  Diagnosis Date  . Arthritis   . Asthma   . Carpal tunnel syndrome   . Colonic polyp   . Degenerative joint disease of cervical and lumbar spine   . Dysrhythmia   . GERD (gastroesophageal reflux disease)   . Glaucoma   . Hashimoto's thyroiditis    a. 05/2002 s/p resection of thyroid nodule-->on replacement.  . Hiatal hernia   . History of depression   . Hyperlipidemia   . Hypothyroidism   . Irritable bowel syndrome   . Osteoporosis   . PAF (paroxysmal atrial fibrillation) (HCC)    a. CHA2DS2VASc = 5-->xarelto;    . Perennial allergic rhinitis   . Right knee meniscal tear   . TIA (transient ischemic attack)    a. 06/2013: LUE/LLE wkns x 15 mins, MRI neg for CVA.    Past Surgical History:  Procedure Laterality Date  . BACK SURGERY     L5  . CATARACT EXTRACTION    . CERVICAL CONE BIOPSY    . CERVICAL FUSION    . COLONOSCOPY    . COLONOSCOPY WITH PROPOFOL N/A 01/20/2017   Procedure: COLONOSCOPY WITH PROPOFOL;  Surgeon: Scot Jun, MD;  Location: [facility] ENDOSCOPY;  Service: Endoscopy;  Laterality:  N/A;  . ESOPHAGOGASTRODUODENOSCOPY (EGD) WITH PROPOFOL N/A 01/20/2017   Procedure: ESOPHAGOGASTRODUODENOSCOPY (EGD) WITH PROPOFOL;  Surgeon: Scot Jun, MD;  Location: [facility] ENDOSCOPY;  Service: Endoscopy;  Laterality: N/A;  . ESOPHAGOGASTRODUODENOSCOPY (EGD) WITH PROPOFOL N/A 04/28/2017   Procedure: ESOPHAGOGASTRODUODENOSCOPY (EGD) WITH PROPOFOL;  Surgeon: Scot Jun, MD;  Location: [facility] ENDOSCOPY;  Service: Endoscopy;  Laterality: N/A;    Social History        Tobacco Use  . Smoking status: Never Smoker  . Smokeless tobacco: Never Used  Substance Use Topics  . Alcohol use: Yes    Comment: none last 24hrs  . Drug use: No         Family History  Problem Relation Age of Onset  . Hypertension Sister   no bleeding or clotting disorders No  aneurysms        Allergies  Allergen Reactions  . Amoxicillin-Pot Clavulanate Nausea And Vomiting    Has patient had a PCN reaction causing immediate rash, facial/tongue/throat swelling, SOB or lightheadedness with hypotension: No Has patient had a PCN reaction causing severe rash involving mucus membranes or skin necrosis: No Has patient had a PCN reaction that required hospitalization: No Has patient had a PCN reaction occurring within the last 10 years: Yes  If all of the above answers are "NO", then may proceed with Cephalosporin use.   . Codeine Nausea And Vomiting  . Other Other (See Comments)    Other reaction(s): Unknown GLAUCOMA DROPS  . Septra [Sulfamethoxazole-Trimethoprim] Other (See Comments)    Reaction: unknown  . Simvastatin Other (See Comments)    "did something to muscles"  . Sulfa Antibiotics Other (See Comments)    Reaction: unknown   REVIEW OF SYSTEMS(Negative unless checked)  Constitutional: [x] ??Weight loss[] ??Fever[] ??Chills Cardiac:[] ??Chest pain[] ??Chest pressure[] ??Palpitations [] ??Shortness of breath when laying flat [] ??Shortness of breath at rest [] ??Shortness of breath with exertion. Vascular: [] ??Pain in legs with walking[] ??Pain in legsat rest[] ??Pain in legs when laying flat [] ??Claudication [] ??Pain in feet when walking [] ??Pain in feet at rest [] ??Pain in feet when laying flat [] ??History of DVT [] ??Phlebitis [] ??Swelling in legs [] ??Varicose veins [] ??Non-healing ulcers Pulmonary: [] ??Uses home oxygen [] ??Productive cough[] ??Hemoptysis [] ??Wheeze [] ??COPD [] ??Asthma Neurologic: [] ??Dizziness [] ??Blackouts [] ??Seizures [] ??History of stroke [] ??History of TIA[] ??Aphasia [] ??Temporary blindness[] ??Dysphagia [] ??Weaknessor numbness in arms [] ??Weakness or numbnessin legs Musculoskeletal: [] ??Arthritis [] ??Joint swelling [] ??Joint pain [] ??Low back  pain Hematologic:[] ??Easy bruising[] ??Easy bleeding [] ??Hypercoagulable state [] ??Anemic [] ??Hepatitis Gastrointestinal:[] ??Blood in stool[] ??Vomiting blood[] ??Gastroesophageal reflux/heartburn[x] ??Abdominal pain Genitourinary: [] ??Chronic kidney disease [] ??Difficulturination [] ??Frequenturination [] ??Burning with urination[] ??Hematuria Skin: [] ??Rashes [] ??Ulcers [] ??Wounds Psychological: [] ??History of anxiety[] ??History of major depression.    Physical Examination  BP 127/75 (BP Location: Right Arm)   Pulse 60   Resp 16   Wt 128 lb 9.6 oz (58.3 kg)   BMI 22.78 kg/m  Gen:  WD/WN, NAD.  Appears younger than stated age Head: [place]/AT, No temporalis wasting. Ear/Nose/Throat: Hearing grossly intact, nares w/o erythema or drainage Eyes: Conjunctiva clear. Sclera non-icteric Neck: Supple.  Trachea midline Pulmonary:  Good air movement, no use of accessory muscles.  Cardiac: RRR, no JVD Vascular:  Vessel Right Left  Radial Palpable Palpable                                   Gastrointestinal: soft, non-tender/non-distended. No guarding/reflex.  Musculoskeletal: M/S 5/5 throughout.  No deformity or atrophy.  No edema. Neurologic: Sensation grossly intact in extremities.  Symmetrical.  Speech is fluent.  Psychiatric: Judgment intact, Mood & affect appropriate for  pt's clinical situation. Dermatologic: No rashes or ulcers noted.  No cellulitis or open wounds.       Labs Recent Results (from the past 2160 hour(s))  Cytology - PAP     Status: None   Collection Time: 10/09/19 11:24 AM  Result Value Ref Range   High risk HPV Negative    Adequacy      Satisfactory for evaluation; transformation zone component PRESENT.   Diagnosis      - Negative for intraepithelial lesion or malignancy (NILM)   Comment Normal Reference Range HPV - Negative     Radiology No results found.  Assessment/Plan Abdominal pain May be multifactorial, but  with 2 normal duplex examinations on her last 2 visits I do not think that visceral disease is the primary cause.  Symptoms are stable.  I am not going to plan further duplex follow-up at this point as I do not think this is the primary issue causing her abdominal pain.  I will see her back as needed.  Atrial fibrillation On anticoagulation  Hyperlipidemia lipid control important in reducing the progression of atherosclerotic disease. Continue statin therapy     Festus Barren, MD  12/17/2019 9:24 AM    This note was created with Dragon medical transcription system.  Any errors from dictation are purely unintentional

## 2020-01-07 ENCOUNTER — Other Ambulatory Visit: Payer: Self-pay | Admitting: Cardiovascular Disease

## 2020-01-07 NOTE — Telephone Encounter (Signed)
Last Xarelto refill was sent on 11/27/2019 for 30 tabs to [facility]. Xarelto 20mg  refill request received. Pt is 85 years old, weight-58.3kg on 12/17/2019, Crea-0.90 on 08/20/2019 via [facility] from [facility], last seen by Dr. [facility] on 12/10/2019, Diagnosis-Afib, CrCl-43.66ml/min; Dose is inappropriate based on dosing criteria. Will send a message to Dr. 52m.

## 2020-01-07 NOTE — Telephone Encounter (Signed)
Patient calling in stating that [facility] is trying to reach the office to okay patients Xarelto prescription  Patient is stating that she was told [facility] said only a 30 day amount was approved but patient is needing a 90 day amount  Please advise

## 2020-01-07 NOTE — Telephone Encounter (Signed)
Refill Request.  

## 2020-01-08 MED ORDER — RIVAROXABAN 15 MG PO TABS: 15.0000 mg | ORAL_TABLET | Freq: Every day | ORAL | 1 refills | Status: DC

## 2020-01-08 NOTE — Telephone Encounter (Signed)
-----   Message from Antonieta Iba, MD sent at 01/08/2020 11:09 AM EDT ----- Regarding: RE: Xarelto Dose Appropriate dose may be Xarelto 15 mg daily Let us know if you want [place] to do the refill Thx TG ----- Message ----- From: Kandace Parkins, RN Sent: 01/07/2020   4:52 PM EDT To: Antonieta Iba, MD, Marilynne Halsted, RN Subject: Xarelto Dose                                   Dr. Mariah Milling,  The pt has requested a Xarelto 20mg  refill. The pt is 85 years old, weight-58.3kg, Crea-0.90 on 08/20/2019 via [facility] from [place], [place], CrCl-43.20ml/min; Dose is inappropriate based on dosing criteria. Please advise on doseage.  Thank you, 47m, RN, BSN

## 2020-01-08 NOTE — Telephone Encounter (Signed)
Left message for pt that I am sending in Xarelto 15 mg once daily to [facility]. Asked her to call back if she would like for me to send a small amount to her local pharmacy until it arrives.

## 2020-01-09 NOTE — Telephone Encounter (Signed)
Left a message for the pt to ensure she got the message regarding her Xarelto dose change.

## 2020-02-16 ENCOUNTER — Other Ambulatory Visit: Payer: Self-pay | Admitting: Cardiovascular Disease

## 2020-04-06 ENCOUNTER — Other Ambulatory Visit: Payer: Self-pay

## 2020-04-06 MED ORDER — RIVAROXABAN 15 MG PO TABS: 15.0000 mg | ORAL_TABLET | Freq: Every day | ORAL | 1 refills | Status: DC

## 2020-04-06 NOTE — Telephone Encounter (Signed)
Pt's age 85, wt 58.3 kg, SCr 1.1, CrCl 35.66, last ov w/ TG 12/20/19.

## 2020-07-17 ENCOUNTER — Other Ambulatory Visit
Admission: RE | Admit: 2020-07-17 | Discharge: 2020-07-17 | Disposition: A | Discharge status: Home / Self Care | Payer: Medicare Other | Source: Ambulatory Visit | Attending: Gastroenterology | Admitting: Gastroenterology

## 2020-07-17 ENCOUNTER — Other Ambulatory Visit: Payer: Self-pay

## 2020-07-17 DIAGNOSIS — Z01812 Encounter for preprocedural laboratory examination: Secondary | ICD-10-CM | POA: Diagnosis present

## 2020-07-17 DIAGNOSIS — Z20822 Contact with and (suspected) exposure to covid-19: Secondary | ICD-10-CM | POA: Insufficient documentation

## 2020-07-17 LAB — SARS CORONAVIRUS 2 (TAT 6-24 HRS): SARS Coronavirus 2: NEGATIVE

## 2020-07-21 ENCOUNTER — Ambulatory Visit: Payer: Medicare Other

## 2020-07-21 ENCOUNTER — Encounter: Payer: Self-pay | Admitting: *Deleted

## 2020-07-21 ENCOUNTER — Ambulatory Visit
Admission: RE | Admit: 2020-07-21 | Discharge: 2020-07-21 | Disposition: A | Discharge status: Home / Self Care | Payer: Medicare Other | Attending: Gastroenterology | Admitting: Gastroenterology

## 2020-07-21 ENCOUNTER — Other Ambulatory Visit: Payer: Self-pay

## 2020-07-21 ENCOUNTER — Encounter
Admission: RE | Disposition: A | Discharge status: Home / Self Care | Payer: Self-pay | Source: Home / Self Care | Attending: Gastroenterology

## 2020-07-21 DIAGNOSIS — Z79899 Other long term (current) drug therapy: Secondary | ICD-10-CM | POA: Diagnosis not present

## 2020-07-21 DIAGNOSIS — Z885 Allergy status to narcotic agent status: Secondary | ICD-10-CM | POA: Diagnosis not present

## 2020-07-21 DIAGNOSIS — Z882 Allergy status to sulfonamides status: Secondary | ICD-10-CM | POA: Insufficient documentation

## 2020-07-21 DIAGNOSIS — R1013 Epigastric pain: Secondary | ICD-10-CM | POA: Diagnosis not present

## 2020-07-21 DIAGNOSIS — K625 Hemorrhage of anus and rectum: Secondary | ICD-10-CM | POA: Insufficient documentation

## 2020-07-21 DIAGNOSIS — K449 Diaphragmatic hernia without obstruction or gangrene: Secondary | ICD-10-CM | POA: Diagnosis not present

## 2020-07-21 DIAGNOSIS — I480 Paroxysmal atrial fibrillation: Secondary | ICD-10-CM | POA: Diagnosis not present

## 2020-07-21 DIAGNOSIS — Z7901 Long term (current) use of anticoagulants: Secondary | ICD-10-CM | POA: Diagnosis not present

## 2020-07-21 DIAGNOSIS — Z8673 Personal history of transient ischemic attack (TIA), and cerebral infarction without residual deficits: Secondary | ICD-10-CM | POA: Insufficient documentation

## 2020-07-21 DIAGNOSIS — Z86010 Personal history of colonic polyps: Secondary | ICD-10-CM | POA: Diagnosis not present

## 2020-07-21 DIAGNOSIS — Z79890 Hormone replacement therapy (postmenopausal): Secondary | ICD-10-CM | POA: Diagnosis not present

## 2020-07-21 DIAGNOSIS — K219 Gastro-esophageal reflux disease without esophagitis: Secondary | ICD-10-CM | POA: Diagnosis not present

## 2020-07-21 DIAGNOSIS — K640 First degree hemorrhoids: Secondary | ICD-10-CM | POA: Insufficient documentation

## 2020-07-21 DIAGNOSIS — Z880 Allergy status to penicillin: Secondary | ICD-10-CM | POA: Insufficient documentation

## 2020-07-21 DIAGNOSIS — E785 Hyperlipidemia, unspecified: Secondary | ICD-10-CM | POA: Diagnosis not present

## 2020-07-21 DIAGNOSIS — K589 Irritable bowel syndrome without diarrhea: Secondary | ICD-10-CM | POA: Insufficient documentation

## 2020-07-21 DIAGNOSIS — Z881 Allergy status to other antibiotic agents status: Secondary | ICD-10-CM | POA: Diagnosis not present

## 2020-07-21 HISTORY — PX: ESOPHAGOGASTRODUODENOSCOPY: SHX5428

## 2020-07-21 HISTORY — PX: COLONOSCOPY WITH PROPOFOL: SHX5780

## 2020-07-21 SURGERY — EGD (ESOPHAGOGASTRODUODENOSCOPY)
Anesthesia: General

## 2020-07-21 MED ORDER — PROPOFOL 10 MG/ML IV BOLUS
INTRAVENOUS | Status: DC | PRN
  Administered 2020-07-21: 10 mg via INTRAVENOUS
  Administered 2020-07-21: 40 mg via INTRAVENOUS

## 2020-07-21 MED ORDER — PROPOFOL 500 MG/50ML IV EMUL
INTRAVENOUS | Status: DC | PRN
  Administered 2020-07-21: 140 ug/kg/min via INTRAVENOUS

## 2020-07-21 MED ORDER — SODIUM CHLORIDE 0.9 % IV SOLN: INTRAVENOUS | Status: DC

## 2020-07-21 MED ORDER — GLYCOPYRROLATE 0.2 MG/ML IJ SOLN
INTRAMUSCULAR | Status: DC | PRN
  Administered 2020-07-21: .1 mg via INTRAVENOUS

## 2020-07-21 MED ORDER — PROPOFOL 500 MG/50ML IV EMUL
INTRAVENOUS | Status: AC
  Filled 2020-07-21: qty 50

## 2020-07-21 MED ORDER — PROPOFOL 500 MG/50ML IV EMUL
INTRAVENOUS | Status: AC
  Filled 2020-07-21: qty 100

## 2020-07-21 MED ORDER — LIDOCAINE HCL (CARDIAC) PF 100 MG/5ML IV SOSY
PREFILLED_SYRINGE | INTRAVENOUS | Status: DC | PRN
  Administered 2020-07-21: 50 mg via INTRAVENOUS

## 2020-07-21 MED ORDER — PROPOFOL 10 MG/ML IV BOLUS
INTRAVENOUS | Status: AC
  Filled 2020-07-21: qty 20

## 2020-07-21 NOTE — Anesthesia Preprocedure Evaluation (Addendum)
Anesthesia Evaluation  Patient identified by MRN, date of birth, ID band Patient awake    Reviewed: Allergy & Precautions, NPO status , Patient's Chart, lab work & pertinent test results  History of Anesthesia Complications Negative for: history of anesthetic complications  Airway Mallampati: II  TM Distance: >3 FB Neck ROM: Full    Dental no notable dental hx. (+) Teeth Intact   Pulmonary asthma , neg sleep apnea, neg COPD,    breath sounds clear to auscultation- rhonchi (-) wheezing      Cardiovascular Exercise Tolerance: Good (-) hypertension(-) CAD, (-) Past MI and (-) Cardiac Stents + dysrhythmias Atrial Fibrillation  Rhythm:Regular Rate:Normal - Systolic murmurs and - Diastolic murmurs    Neuro/Psych TIA Neuromuscular disease negative psych ROS   GI/Hepatic Neg liver ROS, hiatal hernia, GERD  ,  Endo/Other  neg diabetesHypothyroidism   Renal/GU Renal disease     Musculoskeletal  (+) Arthritis ,   Abdominal (+) - obese,   Peds  Hematology negative hematology ROS (+)   Anesthesia Other Findings Rectal Bleed, Dyspepsia  . Aortic atherosclerosis (CMS-HCC)  on CT scan 05/2018  . Asthma without status asthmaticus, unspecified  mild  . Atrial fibrillation (CMS-HCC)  . Atypical chest pain  Normal Thallium treadmill stress test in December 1991  . Colon polyp  . GERD (gastroesophageal reflux disease)  Manifested by chest pain, negative workup. a) Gastritis - biopsy.  . Glaucoma (increased eye pressure)  . Hashimoto's thyroiditis  . History of abnormal cervical Pap smear  . History of carpal tunnel syndrome  right hand  . History of chronic cystitis  History of trigonitis, urethral polyps, chronic cystitis, stable. Followed by Dr. Leonette Monarch.  Marland Kitchen History of depression  . Hx of adenomatous colonic polyps  . Hyperlipidemia  . Hypothyroidism  . Irritable bowel syndrome  Negative workup to include barium enema,  pelvic ultrasound, flex sig.  . Osteoarthritis  a) Cervical and lumbar disc disease b) carpal tunnel c) right knee meniscus tear  . Osteoporosis, post-menopausal  S/p Boniva with GI upset  . Perennial allergic rhinitis  . Thyroid nodule  Status post resection in January 2004, revealing Hashimoto's thyroiditis.  . Transient ischemic attack (TIA) 06/2013  . Trigonitis  . Urethral polyp     Reproductive/Obstetrics                            Anesthesia Physical  Anesthesia Plan  ASA: III  Anesthesia Plan: General   Post-op Pain Management:    Induction: Intravenous  PONV Risk Score and Plan: 2 and Propofol infusion and TIVA  Airway Management Planned: Natural Airway and Nasal Cannula  Additional Equipment:   Intra-op Plan:   Post-operative Plan:   Informed Consent: I have reviewed the patients History and Physical, chart, labs and discussed the procedure including the risks, benefits and alternatives for the proposed anesthesia with the patient or authorized representative who has indicated his/her understanding and acceptance.     Dental advisory given  Plan Discussed with: CRNA and Anesthesiologist  Anesthesia Plan Comments:         Anesthesia Quick Evaluation

## 2020-07-21 NOTE — Anesthesia Procedure Notes (Signed)
Procedure Name: MAC Date/Time: 07/21/2020 7:44 AM Performed by: Lily Peer, Summer, CRNA Pre-anesthesia Checklist: Patient identified, Suction available, Emergency Drugs available, Patient being monitored and Timeout performed Patient Re-evaluated:Patient Re-evaluated prior to induction Oxygen Delivery Method: Nasal cannula Induction Type: IV induction

## 2020-07-21 NOTE — Anesthesia Postprocedure Evaluation (Signed)
Anesthesia Post Note  Patient: DALEYSA KRISTIANSEN  Procedure(s) Performed: ESOPHAGOGASTRODUODENOSCOPY (EGD) (N/A ) COLONOSCOPY WITH PROPOFOL (N/A )  Patient location during evaluation: Phase II Anesthesia Type: General Level of consciousness: awake and alert, awake and oriented Pain management: pain level controlled Vital Signs Assessment: post-procedure vital signs reviewed and stable Respiratory status: spontaneous breathing, nonlabored ventilation and respiratory function stable Cardiovascular status: blood pressure returned to baseline and stable Postop Assessment: no apparent nausea or vomiting Anesthetic complications: no   No complications documented.   Last Vitals:  Vitals:   07/21/20 0825 07/21/20 0845  BP:  131/67  Pulse:    Resp:    Temp:    SpO2: 99%     Last Pain:  Vitals:   07/21/20 0845  TempSrc:   PainSc: 0-No pain                 Manfred Arch

## 2020-07-21 NOTE — Transfer of Care (Signed)
Immediate Anesthesia Transfer of Care Note  Patient: Jacqueline Powers  Procedure(s) Performed: ESOPHAGOGASTRODUODENOSCOPY (EGD) (N/A ) COLONOSCOPY WITH PROPOFOL (N/A )  Patient Location: Endoscopy Unit  Anesthesia Type:General  Level of Consciousness: drowsy  Airway & Oxygen Therapy: Patient Spontanous Breathing  Post-op Assessment: Report given to RN and Post -op Vital signs reviewed and stable  Post vital signs: Reviewed and stable  Last Vitals:  Vitals Value Taken Time  BP 98/55 07/21/20 0815  Temp 36.1 C 07/21/20 0815  Pulse 77 07/21/20 0815  Resp 14 07/21/20 0815  SpO2 95 % 07/21/20 0815  Vitals shown include unvalidated device data.  Last Pain:  Vitals:   07/21/20 0815  TempSrc: Temporal  PainSc: Asleep      Patients Stated Pain Goal: 0 (07/21/20 0708)  Complications: No complications documented.

## 2020-07-21 NOTE — Interval H&P Note (Signed)
History and Physical Interval Note:  07/21/2020 7:43 AM  Jacqueline Powers  has presented today for surgery, with the diagnosis of DYSPEPSIA,RECTAL BLEEDING.  The various methods of treatment have been discussed with the patient and family. After consideration of risks, benefits and other options for treatment, the patient has consented to  Procedure(s): ESOPHAGOGASTRODUODENOSCOPY (EGD) (N/A) COLONOSCOPY WITH PROPOFOL (N/A) as a surgical intervention.  The patient's history has been reviewed, patient examined, no change in status, stable for surgery.  I have reviewed the patient's chart and labs.  Questions were answered to the patient's satisfaction.     Regis Bill  Ok to proceed with EGD/Colonoscopy

## 2020-07-21 NOTE — H&P (Signed)
Outpatient short stay form Pre-procedure 07/21/2020 7:40 AM Merlyn Lot MD, MPH  Primary Physician: Dr. Letitia Libra  Reason for visit:  Dyspepsia/Rectal bleeding  History of present illness:   86 y/o lady with history of chronic abdominal pain and new onset dyspepsia here for EGD/Colonoscopy. Last dose of NOAC was 2.5 days ago. No new GI symptoms. No significant abdominal surgeries. No family history of GI malignancies.    Current Facility-Administered Medications:    0.9 %  sodium chloride infusion, , Intravenous, Continuous, Abria Vannostrand, Rossie Muskrat, MD, Last Rate: 20 mL/hr at 07/21/20 0727, New Bag at 07/21/20 0727  Medications Prior to Admission  Medication Sig Dispense Refill Last Dose   alum & mag hydroxide-simeth (MAALOX PLUS) 400-400-40 MG/5ML suspension Take by mouth.   Past Month at Unknown time   cholecalciferol (VITAMIN D3) 25 MCG (1000 UNIT) tablet Take 1,000 Units by mouth daily. 2 times a day   Past Week at Unknown time   dorzolamide-timolol (COSOPT) 22.3-6.8 MG/ML ophthalmic solution Place 1 drop into the right eye 2 (two) times daily.   07/21/2020 at 0600   ezetimibe (ZETIA) 10 MG tablet Take 10 mg by mouth daily.   Past Month at Unknown time   fluticasone (FLONASE) 50 MCG/ACT nasal spray Place 2 sprays into both nostrils at bedtime as needed for allergies.    Past Month at Unknown time   levothyroxine (SYNTHROID, LEVOTHROID) 25 MCG tablet Take 25 mcg by mouth daily before breakfast.    07/20/2020 at 0800   LUMIGAN 0.01 % SOLN Place 1 drop into both eyes at bedtime.    07/20/2020 at 2200   metoprolol succinate (TOPROL-XL) 25 MG 24 hr tablet TAKE 1 TABLET BY MOUTH  DAILY 90 tablet 2 07/20/2020 at 0800   omeprazole (PRILOSEC) 40 MG capsule Take 40 mg by mouth as needed.    Past Week at Unknown time   rosuvastatin (CRESTOR) 5 MG tablet TAKE 1 TABLET DAILY AT 6 P.M. 90 tablet 3 Past Week at Unknown time   azelastine (ASTELIN) 0.1 % nasal spray Place into both nostrils 2  (two) times daily. Use in each nostril as directed   07/18/2020   diltiazem (CARDIZEM) 30 MG tablet Take 1 tablet (30 mg total) by mouth 3 (three) times daily as needed (As needed for fast heart rates.). (Patient not taking: Reported on 07/21/2020) 90 tablet 3 Not Taking at Unknown time   Rivaroxaban (XARELTO) 15 MG TABS tablet Take 1 tablet (15 mg total) by mouth daily with supper. 90 tablet 1 07/18/2020 at 2000     Allergies  Allergen Reactions   Amoxicillin-Pot Clavulanate Nausea And Vomiting    Has patient had a PCN reaction causing immediate rash, facial/tongue/throat swelling, SOB or lightheadedness with hypotension: No Has patient had a PCN reaction causing severe rash involving mucus membranes or skin necrosis: No Has patient had a PCN reaction that required hospitalization: No Has patient had a PCN reaction occurring within the last 10 years: Yes  If all of the above answers are "NO", then may proceed with Cephalosporin use.    Codeine Nausea And Vomiting   Other Other (See Comments)    Other reaction(s): Unknown GLAUCOMA DROPS   Septra [Sulfamethoxazole-Trimethoprim] Other (See Comments)    Reaction: unknown   Simvastatin Other (See Comments)    "did something to muscles"   Sulfa Antibiotics Other (See Comments)    Reaction: unknown     Past Medical History:  Diagnosis Date   Arthritis    Asthma  Carpal tunnel syndrome    Colonic polyp    Degenerative joint disease of cervical and lumbar spine    Dysrhythmia    GERD (gastroesophageal reflux disease)    Glaucoma    Hashimoto's thyroiditis    a. 05/2002 s/p resection of thyroid nodule-->on replacement.   Hiatal hernia    History of depression    Hyperlipidemia    Hypothyroidism    Irritable bowel syndrome    Osteoporosis    PAF (paroxysmal atrial fibrillation) (HCC)    a. CHA2DS2VASc = 5-->xarelto;     Perennial allergic rhinitis    Right knee meniscal tear    TIA (transient ischemic  attack)    a. 06/2013: LUE/LLE wkns x 15 mins, MRI neg for CVA.    Review of systems:  Otherwise negative.    Physical Exam  Gen: Alert, oriented. Appears stated age.  HEENT: PERRLA. Lungs: No respiratory distress CV: RRR Abd: soft, benign, no masses Ext: No edema    Planned procedures: Proceed with EGD/colonoscopy. The patient understands the nature of the planned procedure, indications, risks, alternatives and potential complications including but not limited to bleeding, infection, perforation, damage to internal organs and possible oversedation/side effects from anesthesia. The patient agrees and gives consent to proceed.  Please refer to procedure notes for findings, recommendations and patient disposition/instructions.     Merlyn Lot MD, MPH Gastroenterology 07/21/2020  7:40 AM

## 2020-07-21 NOTE — Op Note (Signed)
[facility] Patient Name: Jacqueline Powers Procedure Date: 07/21/2020 7:43 AM MRN: [id number] Account #: [id number] Date of Birth: 09-22-1934 Admit Type: Outpatient Age: 86 Room: [facility] ENDO ROOM 1 Gender: Female Note Status: Finalized Procedure:             Upper GI endoscopy Indications:           Dyspepsia Providers:             Andrey Farmer MD, MD Referring MD:          Biagio Borg, MD (Referring MD) Medicines:             Monitored Anesthesia Care Complications:         No immediate complications. Estimated blood loss:                         Minimal. Procedure:             Pre-Anesthesia Assessment:                        - Prior to the procedure, a History and Physical was                         performed, and patient medications and allergies were                         reviewed. The patient is competent. The risks and                         benefits of the procedure and the sedation options and                         risks were discussed with the patient. All questions                         were answered and informed consent was obtained.                         Patient identification and proposed procedure were                         verified by the physician, the nurse, the anesthetist                         and the technician in the endoscopy suite. Mental                         Status Examination: alert and oriented. Airway                         Examination: normal oropharyngeal airway and neck                         mobility. Respiratory Examination: clear to                         auscultation. CV Examination: normal. Prophylactic                         Antibiotics:  The patient does not require prophylactic                         antibiotics. Prior Anticoagulants: The patient has                         taken Xarelto (rivaroxaban), last dose was 2 days                         prior to procedure. ASA Grade Assessment:  II - A                         patient with mild systemic disease. After reviewing                         the risks and benefits, the patient was deemed in                         satisfactory condition to undergo the procedure. The                         anesthesia plan was to use monitored anesthesia care                         (MAC). Immediately prior to administration of                         medications, the patient was re-assessed for adequacy                         to receive sedatives. The heart rate, respiratory                         rate, oxygen saturations, blood pressure, adequacy of                         pulmonary ventilation, and response to care were                         monitored throughout the procedure. The physical                         status of the patient was re-assessed after the                         procedure.                        After obtaining informed consent, the endoscope was                         passed under direct vision. Throughout the procedure,                         the patient's blood pressure, pulse, and oxygen                         saturations were monitored continuously. The Endoscope  was introduced through the mouth, and advanced to the                         second part of duodenum. The upper GI endoscopy was                         accomplished without difficulty. The patient tolerated                         the procedure well. Findings:      A 5 cm hiatal hernia was present.      The exam of the esophagus was otherwise normal.      The entire examined stomach was normal. Biopsies were taken with a cold       forceps for Helicobacter pylori testing. Estimated blood loss was       minimal.      The examined duodenum was normal. Impression:            - 5 cm hiatal hernia.                        - Normal stomach. Biopsied.                        - Normal examined duodenum. Recommendation:         - Perform a colonoscopy today. Procedure Code(s):     --- Professional ---                        [phone], Esophagogastroduodenoscopy, flexible,                         transoral; with biopsy, single or multiple Diagnosis Code(s):     --- Professional ---                        K44.9, Diaphragmatic hernia without obstruction or                         gangrene                        R10.13, Epigastric pain CPT copyright 2019 American Medical Association. All rights reserved. The codes documented in this report are preliminary and upon coder review may  be revised to meet current compliance requirements. Andrey Farmer MD, MD 07/21/2020 8:15:29 AM Number of Addenda: 0 Note Initiated On: 07/21/2020 7:43 AM Estimated Blood Loss:  Estimated blood loss was minimal.      [facility]

## 2020-07-21 NOTE — Op Note (Signed)
[facility] Patient Name: Jacqueline Powers Procedure Date: 07/21/2020 7:41 AM MRN: [id number] Account #: [id number] Date of Birth: 19-Mar-1935 Admit Type: Outpatient Age: 86 Room: [facility] ENDO ROOM 1 Gender: Female Note Status: Finalized Procedure:             Colonoscopy Indications:           Rectal bleeding Providers:             Andrey Farmer MD, MD Referring MD:          Biagio Borg, MD (Referring MD) Medicines:             Monitored Anesthesia Care Complications:         No immediate complications. Procedure:             Pre-Anesthesia Assessment:                        - Prior to the procedure, a History and Physical was                         performed, and patient medications and allergies were                         reviewed. The patient is competent. The risks and                         benefits of the procedure and the sedation options and                         risks were discussed with the patient. All questions                         were answered and informed consent was obtained.                         Patient identification and proposed procedure were                         verified by the physician, the nurse, the anesthetist                         and the technician in the endoscopy suite. Mental                         Status Examination: alert and oriented. Airway                         Examination: normal oropharyngeal airway and neck                         mobility. Respiratory Examination: clear to                         auscultation. CV Examination: normal. Prophylactic                         Antibiotics: The patient does not require prophylactic  antibiotics. Prior Anticoagulants: The patient has                         taken Xarelto (rivaroxaban), last dose was 2 days                         prior to procedure. ASA Grade Assessment: II - A                         patient with mild  systemic disease. After reviewing                         the risks and benefits, the patient was deemed in                         satisfactory condition to undergo the procedure. The                         anesthesia plan was to use monitored anesthesia care                         (MAC). Immediately prior to administration of                         medications, the patient was re-assessed for adequacy                         to receive sedatives. The heart rate, respiratory                         rate, oxygen saturations, blood pressure, adequacy of                         pulmonary ventilation, and response to care were                         monitored throughout the procedure. The physical                         status of the patient was re-assessed after the                         procedure.                        After obtaining informed consent, the colonoscope was                         passed under direct vision. Throughout the procedure,                         the patient's blood pressure, pulse, and oxygen                         saturations were monitored continuously. The was                         introduced through the anus and advanced to the the  terminal ileum. The colonoscopy was performed without                         difficulty. The patient tolerated the procedure well.                         The quality of the bowel preparation was good. Findings:      The perianal and digital rectal examinations were normal.      The terminal ileum appeared normal.      Non-bleeding internal hemorrhoids were found during retroflexion. The       hemorrhoids were Grade I (internal hemorrhoids that do not prolapse).      The exam was otherwise without abnormality on direct and retroflexion       views. Impression:            - The examined portion of the ileum was normal.                        - Non-bleeding internal hemorrhoids.                         - The examination was otherwise normal on direct and                         retroflexion views.                        - No specimens collected. Recommendation:        - Discharge patient to home.                        - Resume previous diet.                        - Resume Xarelto (rivaroxaban) at prior dose today.                        - Repeat colonoscopy is not recommended due to current                         age (42 years or older) for surveillance.                        - Return to referring physician as previously                         scheduled. Procedure Code(s):     --- Professional ---                        [phone], Colonoscopy, flexible; diagnostic, including                         collection of specimen(s) by brushing or washing, when                         performed (separate procedure) Diagnosis Code(s):     --- Professional ---                        K64.0, First degree hemorrhoids  K62.5, Hemorrhage of anus and rectum CPT copyright 2019 American Medical Association. All rights reserved. The codes documented in this report are preliminary and upon coder review may  be revised to meet current compliance requirements. Andrey Farmer MD, MD 07/21/2020 8:18:11 AM Number of Addenda: 0 Note Initiated On: 07/21/2020 7:41 AM Scope Withdrawal Time: 0 hours 8 minutes 48 seconds  Total Procedure Duration: 0 hours 15 minutes 36 seconds  Estimated Blood Loss:  Estimated blood loss: none.      [facility]

## 2020-07-22 ENCOUNTER — Encounter: Payer: Self-pay | Admitting: Gastroenterology

## 2020-07-22 LAB — SURGICAL PATHOLOGY

## 2020-07-23 ENCOUNTER — Telehealth: Payer: Self-pay | Admitting: Cardiovascular Disease

## 2020-07-23 NOTE — Telephone Encounter (Signed)
Patient calling  Patient had a bad headache and has been dizzy and weak Took BP and it was ~141/83 Patient would like to know if she can take an extra metoprolol  Please call to discuss

## 2020-07-23 NOTE — Telephone Encounter (Signed)
Able to return Jacqueline Powers phone call. She concern for BP evaluated at 141/83. Reports headache, slight dizziness, and weak. Jacqueline Powers states "I get like this when the weather gets crazy warm and cold". She has not taken anything for her headache. Advised if her headache can ease off, it will probably reduce her BP. Advised to try Tylenol with caffeine intake, rest for 30 mins-1 hour w/o light, sound, or distraction and see how her head feels and recheck BP after resting. Pt agreeable with plan and is grateful for advice. Advised to seek medical attention, if weakness starts to show on one side of the body then the other, extreme dizziness, CP, SHOB, and changes in her BP. Pt verbalized understanding, will rest for now, otherwise all questions or concerns were address and no additional concerns at this time, will call back for anything further.

## 2020-08-11 ENCOUNTER — Other Ambulatory Visit: Payer: Self-pay | Admitting: Gastroenterology

## 2020-08-11 ENCOUNTER — Other Ambulatory Visit (HOSPITAL_COMMUNITY): Payer: Self-pay | Admitting: Gastroenterology

## 2020-08-11 DIAGNOSIS — K8681 Exocrine pancreatic insufficiency: Secondary | ICD-10-CM

## 2020-08-17 ENCOUNTER — Ambulatory Visit (HOSPITAL_COMMUNITY)
Admission: RE | Admit: 2020-08-17 | Discharge: 2020-08-17 | Disposition: A | Discharge status: Home / Self Care | Payer: Medicare Other | Source: Ambulatory Visit | Attending: Gastroenterology | Admitting: Gastroenterology

## 2020-08-17 ENCOUNTER — Other Ambulatory Visit: Payer: Self-pay

## 2020-08-17 ENCOUNTER — Other Ambulatory Visit: Payer: Self-pay | Admitting: Gastroenterology

## 2020-08-17 DIAGNOSIS — K8681 Exocrine pancreatic insufficiency: Secondary | ICD-10-CM

## 2020-08-17 MED ORDER — GADOBUTROL 1 MMOL/ML IV SOLN
6.0000 mL | Freq: Once | INTRAVENOUS | Status: AC | PRN
  Administered 2020-08-17: 6 mL via INTRAVENOUS

## 2020-08-20 ENCOUNTER — Telehealth: Payer: Self-pay | Admitting: Cardiovascular Disease

## 2020-08-20 NOTE — Telephone Encounter (Signed)
Was able to return pt's call regarding new medication of Creon, pt wanted to make sure there was no interaction risk with her current cardiac medications. Advised pt there is always a risk with starting new medications of minor side affects and adverse reaction. Was able to check for drug interaction on [employer] and appears Creon is safe with very minor interactions. Suggested to watch for s/s of reaction and intolerance. If concerns or questions to reach out to prescribing provider. Pt verbalized understanding, nothing further at this time.

## 2020-08-20 NOTE — Telephone Encounter (Signed)
Patient states she has be prescribed Creon, and would like to discuss if she can take this with her heart medications. Please call to discuss.

## 2020-08-24 ENCOUNTER — Ambulatory Visit: Payer: Medicare Other

## 2020-09-07 ENCOUNTER — Telehealth: Payer: Self-pay | Admitting: Cardiovascular Disease

## 2020-09-07 NOTE — Telephone Encounter (Signed)
Patient with diagnosis of afib on Xarelto for anticoagulation.    Procedure: ERCP Date of procedure: 09/23/20  CHA2DS2-VASc Score = 6  This indicates a 9.7% annual risk of stroke. The patient's score is based upon: CHF History: No HTN History: No Diabetes History: No Stroke History: Yes Vascular Disease History: Yes Age Score: 2 Gender Score: 1  CrCl 65mL/min Platelet count 216K  2 day Xarelto hold will likely be required for procedure. Given pt's elevated CV risk including history of TIA, will defer to MD for input.

## 2020-09-07 NOTE — Telephone Encounter (Signed)
   [facility] Pre-operative Risk Assessment    Patient Name: Jacqueline Powers  DOB: [birth year]  MRN: [id number]   HEARTCARE STAFF: - Please ensure there is not already an duplicate clearance open for this procedure. - Under Visit Info/Reason for Call, type in Other and utilize the format Clearance MM/DD/YY or Clearance TBD. Do not use dashes or single digits. - If request is for dental extraction, please clarify the # of teeth to be extracted.  Request for surgical clearance:  PATIENT CALLING   1. What type of surgery is being performed? ERCP  2. When is this surgery scheduled? 09/23/20   3. What type of clearance is required (medical clearance vs. Pharmacy clearance to hold med vs. Both)? Pharmacy   4. Are there any medications that need to be held prior to surgery and how long?  Xarelto unknown please advise   5. Practice name and name of physician performing surgery? Dr. Mont Dutton [facility]   6. What is the office phone number? Unknown    7.   What is the office fax number? Unknown   8.   Anesthesia type (None, local, MAC, general) ?  Unknown    Clarisse Gouge 09/07/2020, 1:31 PM  _________________________________________________________________   (provider comments below)

## 2020-09-07 NOTE — Telephone Encounter (Signed)
Please comment on Xarelto 

## 2020-09-08 NOTE — Telephone Encounter (Signed)
Ok to hold xarelto 2 days, thx

## 2020-09-08 NOTE — Telephone Encounter (Signed)
I did not clear pt to hold Xarelto for 2 days - clearance was sent to Dr Mariah Milling for input as pt is at high risk of stroke if anticoagulation needs to be held for >  24 hours due to elevated CHADS2VASc score and prior TIA history. Still awaiting final clearance rec from Dr Mariah Milling.

## 2020-09-08 NOTE — Telephone Encounter (Signed)
   Patient Name: Jacqueline Powers  DOB: [birth year]  MRN: [id number]   Primary Cardiologist: No primary care provider on file.  Chart reviewed as part of pre-operative protocol coverage. Patient was contacted 09/08/2020 in reference to pre-operative risk assessment for pending surgery as outlined below.  Jacqueline Powers was last seen on 12/10/19 by Dr. Mariah Milling.  Since that day, Jacqueline Powers has done overall well from a cardiac perspective, occasional palpitations. Per Pharmacy recommendations, okay to hold Xarelto 2 days prior to procedure.   Therefore, based on ACC/AHA guidelines, the patient would be at acceptable risk for the planned procedure without further cardiovascular testing.   The patient was advised that if she develops new symptoms prior to surgery to contact our office to arrange for a follow-up visit, and she verbalized understanding.  I will route this recommendation to the requesting party via Epic fax function and remove from pre-op pool. Please call with questions.  Sloane Palmer David Stall, PA-C 09/08/2020, 8:58 AM

## 2020-09-08 NOTE — Telephone Encounter (Signed)
ADDENDUM: DRLurena Joiner ANN Shana Chute PHONE# [phone] FAX # [phone]

## 2020-09-24 ENCOUNTER — Telehealth: Payer: Self-pay | Admitting: Cardiovascular Disease

## 2020-09-24 NOTE — Telephone Encounter (Signed)
Pt c/o Shortness Of Breath: STAT if SOB developed within the last 24 hours or pt is noticeably SOB on the phone  1. Are you currently SOB (can you hear that pt is SOB on the phone)? yes  2. How long have you been experiencing SOB? This morning. Patient was intubated yesterday for a procedure and patient is experiencing SOB now  3. Are you SOB when sitting or when up moving around? Moving around  4. Are you currently experiencing any other symptoms? no

## 2020-09-24 NOTE — Telephone Encounter (Signed)
Attempted to reach back out to pt, unable to LDM, no DPR on file.

## 2020-09-24 NOTE — Telephone Encounter (Signed)
Attempted to reach out to pt again, no answer, LM on Vm to call back in to clinic. Cannot LDM d/t no DPR

## 2020-09-29 NOTE — Telephone Encounter (Signed)
Attempted to reach out to Jacqueline Powers again, no answer, LM to call back if still having SOB, cannot LDM d/t no DPR on file.

## 2020-10-05 ENCOUNTER — Other Ambulatory Visit: Payer: Self-pay | Admitting: Cardiovascular Disease

## 2020-10-05 NOTE — Telephone Encounter (Signed)
Attempted to reach back out to pt, still unable to reach pt, cannot LDM on VM (NO DPR on file). Did however, advised to call back to schedule a yearly follow-up, has not been seen since July 2021, no up-coming appt on file.

## 2020-10-05 NOTE — Telephone Encounter (Signed)
Spoke with patient and she received request from [facility] for new prescription to be sent in for her metoprolol and her xarelto. Confirmed that both had been sent in today and she scheduled her follow up appointment as well. She had no further questions at this time.

## 2020-10-05 NOTE — Telephone Encounter (Signed)
48F, 58.3KG, SCR 0.8 08/24/20, LOVW/GOLLAN 12/10/19, CCR 49

## 2020-10-05 NOTE — Telephone Encounter (Signed)
Please review for refill on xarelto 15 mg

## 2020-10-05 NOTE — Telephone Encounter (Signed)
Returning call.

## 2020-12-01 ENCOUNTER — Telehealth: Payer: Self-pay | Admitting: Cardiovascular Disease

## 2020-12-01 NOTE — Telephone Encounter (Signed)
error 

## 2020-12-02 ENCOUNTER — Telehealth: Payer: Self-pay | Admitting: Cardiovascular Disease

## 2020-12-02 NOTE — Telephone Encounter (Signed)
Fax sent to [facility] with PharmD advise from [facility] regarding taking steroids short-term.

## 2020-12-02 NOTE — Telephone Encounter (Signed)
Ok to take short term. Could cause some palpitations or increased heart rate. Ok to use prn diliazem if this happens.

## 2020-12-02 NOTE — Telephone Encounter (Signed)
Patient calling in to see if she would be safe to take a steroid medication for back pain. Patient saw Beverly Milch at [facility] who is recommending patient taking a steroid for pain  Please advise what would be allowed by calling or faxing recommendations to:  Phone: [phone]  Fax:[phone]   **this is for a steroid Rx not shot

## 2020-12-02 NOTE — Telephone Encounter (Signed)
Patient calling back stating [facility] has not received the fax and is calling them back for further instruction

## 2020-12-03 NOTE — Telephone Encounter (Signed)
Correction, the fax number given was going to the [facility] "referral" office for [facility] Correct fax number [phone] Fax successfully went through as well to [facility] with correct fax number

## 2020-12-03 NOTE — Telephone Encounter (Signed)
Noted successful fax noted at 10:40 am, conformation "successful"  Resent same fax with okay to take steroids again this am at 07:20 am.

## 2020-12-08 ENCOUNTER — Other Ambulatory Visit: Payer: Self-pay | Admitting: Internal Medicine

## 2020-12-08 DIAGNOSIS — S32010A Wedge compression fracture of first lumbar vertebra, initial encounter for closed fracture: Secondary | ICD-10-CM

## 2020-12-10 ENCOUNTER — Other Ambulatory Visit: Payer: Self-pay

## 2020-12-10 ENCOUNTER — Ambulatory Visit
Admission: RE | Admit: 2020-12-10 | Discharge: 2020-12-10 | Disposition: A | Discharge status: Home / Self Care | Payer: Medicare Other | Source: Ambulatory Visit | Attending: Internal Medicine | Admitting: Internal Medicine

## 2020-12-10 DIAGNOSIS — S32010A Wedge compression fracture of first lumbar vertebra, initial encounter for closed fracture: Secondary | ICD-10-CM | POA: Diagnosis present

## 2020-12-13 NOTE — Progress Notes (Signed)
Cardiology Office Note  Date:  12/14/2020   ID:  Jacqueline Powers, Jacqueline Powers 07/21/1934, MRN [id number]  PCP:  Gracelyn Nurse, MD   Chief Complaint  Patient presents with   Follow-up    Annual follow up and medications verbally reviewed with patient.     HPI:  Ms. Bolin is a pleasant 86 year old woman with a history of  hyperlipidemia,  Hashimoto's thyroiditis with elevated TSH, osteoporosis,  remote history of chest pain,  GERD,  previously evaluated for jaw pain, neck pain, tachycardia CT coronary calcium score (score of 17) history of neck  surgery, fusion  30 day monitor documented atrial fibrillation.  Prior TIA She presents today for follow-up of her atrial fibrillation   [place]  came home, plane ride Then lots of gardening, Now back pain, severe Reports prior history of back surgery  Had MRI Acute or subacute compression fractures at T12 and L1 with mild loss of height and no significant endplate retropulsion.   Multilevel degenerative changes as detailed above. Most notably, right foraminal disc protrusion at L4-L5 with compression of exiting L4 nerve root.  Denies significant tachypalpitations concerning for atrial fibrillation  Not taking her Zetia Cholesterol higher  EKG personally reviewed by myself on todays visit Shows normal sinus rhythm rate 70 bpm no significant ST-T wave changes  Other past medical history reviewed ABD u/s , Minimal atherosclerosis in the abdominal aorta.  Several CT scans abdomen review on today's visit dating back 2018, 2019, 2020 documenting moderate stenosis of mesenteric vessels  visit in the emergency room 03/26/16: atrial fib rate 150 bpm She was sleeping when she was woken up by severe tachycardia EKG showing atrial fibrillation, converted to NSR w/o intervention  PreviousCT scan imaging reviewed with her showing mild coronary calcification, mild descending aortic calcification     PMH:   has a past  medical history of Arthritis, Asthma, Carpal tunnel syndrome, Colonic polyp, Degenerative joint disease of cervical and lumbar spine, Dysrhythmia, GERD (gastroesophageal reflux disease), Glaucoma, Hashimoto's thyroiditis, Hiatal hernia, History of depression, Hyperlipidemia, Hypothyroidism, Irritable bowel syndrome, Osteoporosis, PAF (paroxysmal atrial fibrillation) (HCC), Perennial allergic rhinitis, Right knee meniscal tear, and TIA (transient ischemic attack).  PSH:    Past Surgical History:  Procedure Laterality Date   BACK SURGERY     L5   CATARACT EXTRACTION     CERVICAL CONE BIOPSY     CERVICAL FUSION     COLONOSCOPY     COLONOSCOPY WITH PROPOFOL N/A 01/20/2017   Procedure: COLONOSCOPY WITH PROPOFOL;  Surgeon: Scot Jun, MD;  Location[facility]COPY;  Service: Endoscopy;  Laterality: N/A;   COLONOSCOPY WITH PROPOFOL N/A 07/21/2020   Procedure: COLONOSCOPY WITH PROPOFOL;  Surgeon: Regis Bill, MD;  Location: [facility] ENDOSCOPY;  Service: Endoscopy;  Laterality: N/A;   ESOPHAGOGASTRODUODENOSCOPY N/A 07/21/2020   Procedure: ESOPHAGOGASTRODUODENOSCOPY (EGD);  Surgeon: Regis Bill, MD;  Location: [facility] ENDOSCOPY;  Service: Endoscopy;  Laterality: N/A;   ESOPHAGOGASTRODUODENOSCOPY (EGD) WITH PROPOFOL N/A 01/20/2017   Procedure: ESOPHAGOGASTRODUODENOSCOPY (EGD) WITH PROPOFOL;  Surgeon: Scot Jun, MD;  Location: [facility] ENDOSCOPY;  Service: Endoscopy;  Laterality: N/A;   ESOPHAGOGASTRODUODENOSCOPY (EGD) WITH PROPOFOL N/A 04/28/2017   Procedure: ESOPHAGOGASTRODUODENOSCOPY (EGD) WITH PROPOFOL;  Surgeon: Scot Jun, MD;  Location: [facility] ENDOSCOPY;  Service: Endoscopy;  Laterality: N/A;    Current Outpatient Medications  Medication Sig Dispense Refill   azelastine (ASTELIN) 0.1 % nasal spray Place into both nostrils 2 (two) times daily. Use in each nostril as directed  cholecalciferol (VITAMIN D3) 25 MCG (1000 UNIT) tablet Take 1,000 Units by mouth daily. 2 times a  day     dorzolamide-timolol (COSOPT) 22.3-6.8 MG/ML ophthalmic solution Place 1 drop into the right eye 2 (two) times daily.     fluticasone (FLONASE) 50 MCG/ACT nasal spray Place 2 sprays into both nostrils at bedtime as needed for allergies.      levothyroxine (SYNTHROID, LEVOTHROID) 25 MCG tablet Take 25 mcg by mouth daily before breakfast.      LUMIGAN 0.01 % SOLN Place 1 drop into both eyes at bedtime.      methocarbamol (ROBAXIN) 500 MG tablet Take 1 tablet by mouth in the morning, at noon, in the evening, and at bedtime.     metoprolol succinate (TOPROL-XL) 25 MG 24 hr tablet TAKE 1 TABLET BY MOUTH  DAILY 90 tablet 2   omeprazole (PRILOSEC) 40 MG capsule Take 40 mg by mouth as needed.      Pancrelipase, Lip-Prot-Amyl, (CREON) 24000-76000 units CPEP Creon 24,000-76,000-120,000 unit capsule,delayed release  TAKE 2 CAPSULES BY MOUTH THREE TIMES DAILY WITH MEALS     rosuvastatin (CRESTOR) 5 MG tablet TAKE 1 TABLET DAILY AT 6 P.M. (Patient taking differently: every other day.) 90 tablet 3   traMADol (ULTRAM) 50 MG tablet Take 50 mg by mouth every 6 (six) hours as needed.     XARELTO 15 MG TABS tablet TAKE 1 TABLET BY MOUTH  DAILY WITH SUPPER 90 tablet 1   diltiazem (CARDIZEM) 30 MG tablet Take 1 tablet (30 mg total) by mouth 3 (three) times daily as needed (As needed for fast heart rates.). (Patient not taking: No sig reported) 90 tablet 3   No current facility-administered medications for this visit.     Allergies:   Amoxicillin-pot clavulanate, Codeine, Other, Septra [sulfamethoxazole-trimethoprim], Simvastatin, and Sulfa antibiotics   Social History:  The patient  reports that she has never smoked. She has never used smokeless tobacco. She reports current alcohol use. She reports that she does not use drugs.   Family History:   family history includes Hypertension in her sister.    Review of Systems: Review of Systems  Constitutional: Negative.   HENT: Negative.    Respiratory:  Negative.    Cardiovascular: Negative.   Gastrointestinal: Negative.   Musculoskeletal: Negative.   Neurological: Negative.   Psychiatric/Behavioral: Negative.    All other systems reviewed and are negative.   PHYSICAL EXAM: VS:  BP 128/72 (BP Location: Left Arm, Patient Position: Sitting, Cuff Size: Normal)   Pulse 70   Ht 5\' 3"  (1.6 m)   Wt 127 lb (57.6 kg)   SpO2 94%   BMI 22.50 kg/m  , BMI Body mass index is 22.5 kg/m. Constitutional:  oriented to person, place, and time. No distress.  HENT:  Head: Grossly normal Eyes:  no discharge. No scleral icterus.  Neck: No JVD, no carotid bruits  Cardiovascular: Regular rate and rhythm, no murmurs appreciated Pulmonary/Chest: Clear to auscultation bilaterally, no wheezes or rails Abdominal: Soft.  no distension.  no tenderness.  Musculoskeletal: Normal range of motion Neurological:  normal muscle tone. Coordination normal. No atrophy Skin: Skin warm and dry Psychiatric: normal affect, pleasant  Recent Labs: No results found for requested labs within last 8760 hours.    Lipid Panel Lab Results  Component Value Date   CHOL 140 09/08/2016   HDL 62 09/08/2016   LDLCALC 64 09/08/2016   TRIG 71 09/08/2016      Wt Readings from Last 3  Encounters:  12/14/20 127 lb (57.6 kg)  07/21/20 128 lb (58.1 kg)  12/17/19 128 lb 9.6 oz (58.3 kg)     ASSESSMENT AND PLAN:   Paroxysmal atrial fibrillation (HCC) - maintaining normal sinus rhythm, stable Tolerating anticoagulation Creatinine clearance 48, on xarelto 15 mg daily  Other specified transient cerebral ischemias On anticoagulation Prior TIAs stable  Hyperlipidemia/peripheral arterial disease Restart zetia On crestor  PAD mesenteric disease,  Back on zetia, Stay on crestor No sx  GI discomfort Pancreatic issues  Chest pain, unspecified chest pain type No sx  Encounter for anticoagulation discussion and counseling Tolerating Xarelto 15 mg daily    Total  encounter time more than 25 minutes  Greater than 50% was spent in counseling and coordination of care with the patient     Orders Placed This Encounter  Procedures   EKG 12-Lead     Signed, Dossie Arbour, M.D., Ph.D. 12/14/2020  [facility] [place], [place] [phone]

## 2020-12-14 ENCOUNTER — Encounter: Payer: Self-pay | Admitting: Cardiovascular Disease

## 2020-12-14 ENCOUNTER — Other Ambulatory Visit: Payer: Self-pay

## 2020-12-14 ENCOUNTER — Ambulatory Visit (INDEPENDENT_AMBULATORY_CARE_PROVIDER_SITE_OTHER): Payer: Medicare Other | Admitting: Cardiovascular Disease

## 2020-12-14 VITALS — BP 128/72 | HR 70 | Ht 63.0 in | Wt 127.0 lb

## 2020-12-14 DIAGNOSIS — R0789 Other chest pain: Secondary | ICD-10-CM | POA: Diagnosis not present

## 2020-12-14 DIAGNOSIS — G459 Transient cerebral ischemic attack, unspecified: Secondary | ICD-10-CM

## 2020-12-14 DIAGNOSIS — I480 Paroxysmal atrial fibrillation: Secondary | ICD-10-CM | POA: Diagnosis not present

## 2020-12-14 DIAGNOSIS — Z7901 Long term (current) use of anticoagulants: Secondary | ICD-10-CM

## 2020-12-14 DIAGNOSIS — E782 Mixed hyperlipidemia: Secondary | ICD-10-CM

## 2020-12-14 MED ORDER — EZETIMIBE 10 MG PO TABS: 10.0000 mg | ORAL_TABLET | Freq: Every day | ORAL | 3 refills | Status: DC

## 2020-12-14 MED ORDER — ROSUVASTATIN CALCIUM 5 MG PO TABS: 5.0000 mg | ORAL_TABLET | Freq: Every day | ORAL | 3 refills | Status: DC

## 2020-12-14 MED ORDER — METOPROLOL SUCCINATE ER 25 MG PO TB24: 25.0000 mg | ORAL_TABLET | Freq: Every day | ORAL | 3 refills | Status: DC

## 2020-12-14 MED ORDER — RIVAROXABAN 15 MG PO TABS: 15.0000 mg | ORAL_TABLET | Freq: Every day | ORAL | 3 refills | Status: DC

## 2020-12-14 MED ORDER — DILTIAZEM HCL 30 MG PO TABS: 30.0000 mg | ORAL_TABLET | Freq: Three times a day (TID) | ORAL | 3 refills | Status: DC | PRN

## 2020-12-14 NOTE — Patient Instructions (Addendum)
Talk with Dr. Lovell Sheehan   Medication Instructions:  Restart  Zetia 10 mg Daily Stay on Crestor while on Zetia  If you need a refill on your cardiac medications before your next appointment, please call your pharmacy.   Lab work: No new labs needed  Testing/Procedures: No new testing needed  Follow-Up: At [facility], you and your health needs are our priority.  As part of our continuing mission to provide you with exceptional heart care, we have created designated Provider Care Teams.  These Care Teams include your primary Cardiologist (physician) and Advanced Practice Providers (APPs -  Physician Assistants and Nurse Practitioners) who all work together to provide you with the care you need, when you need it.  You will need a follow up appointment in 12 months  Providers on your designated Care Team:   Nicolasa Ducking, NP Eula Listen, PA-C Marisue Ivan, PA-C Cadence Oconto, [place]  COVID-19 Vaccine Information can be found at: [email or web address] For questions related to vaccine distribution or appointments, please email [email or web address] or call [phone].

## 2020-12-15 ENCOUNTER — Telehealth: Payer: Self-pay | Admitting: Cardiovascular Disease

## 2020-12-15 NOTE — Telephone Encounter (Signed)
Patient states she has decided to see Dr. Lovell Sheehan for her back and states Dr. Mariah Milling states he was going to send a note/message to Dr. Lovell Sheehan. Please call to discuss.

## 2021-03-24 ENCOUNTER — Telehealth: Payer: Self-pay | Admitting: Cardiovascular Disease

## 2021-03-24 ENCOUNTER — Other Ambulatory Visit: Payer: Self-pay | Admitting: Neurosurgery

## 2021-03-24 NOTE — Telephone Encounter (Signed)
Covering preop today. Last OV 11/2020. Patient is on Xarelto so anticipate this will need to be held. Will route to pharm then pt will need call.

## 2021-03-24 NOTE — Telephone Encounter (Signed)
   [facility] Pre-operative Risk Assessment    Patient Name: Jacqueline Powers  DOB: [birth year] MRN: [id number]  HEARTCARE STAFF:  - IMPORTANT!!!!!! Under Visit Info/Reason for Call, type in Other and utilize the format Clearance MM/DD/YY or Clearance TBD. Do not use dashes or single digits. - Please review there is not already an duplicate clearance open for this procedure. - If request is for dental extraction, please clarify the # of teeth to be extracted. - If the patient is currently at the dentist's office, call Pre-Op Callback Staff (MA/nurse) to input urgent request.  - If the patient is not currently in the dentist office, please route to the Pre-Op pool.  Request for surgical clearance:  What type of surgery is being performed? Kyphoplasty  When is this surgery scheduled? TBA  What type of clearance is required (medical clearance vs. Pharmacy clearance to hold med vs. Both)? Both  Are there any medications that need to be held prior to surgery and how long? Not listed please advise if needed  Practice name and name of physician performing surgery? Dr. Newman Pies, [facility]  What is the office phone number? [phone]   7.   What is the office fax number? [phone]  8.   Anesthesia type (None, local, MAC, general) ? General   [name] 03/24/2021, 2:36 PM  _________________________________________________________________   (provider comments below)

## 2021-03-25 NOTE — Telephone Encounter (Signed)
Patient with diagnosis of atrial fibrillation on Xarelto for anticoagulation.    Procedure: kyphoplasty Date of procedure: TBD   CHA2DS2-VASc Score = 6   This indicates a 9.7% annual risk of stroke. The patient's score is based upon: CHF History: 0 HTN History: 0 Diabetes History: 0 Stroke History: 2 Vascular Disease History: 1 Age Score: 2 Gender Score: 1    CrCl 48 Platelet count 223  Per office protocol, patient will need to hold Xarelto for 3 days prior to procedure, if cleared.      Due to history of TIA, will need to review with primary cardiologist.  Previously had 2 day hold w/o concern (08/2020).   Chart notes hx of TIA 06/2013.

## 2021-03-29 ENCOUNTER — Other Ambulatory Visit: Payer: Self-pay | Admitting: Cardiovascular Disease

## 2021-03-29 NOTE — Telephone Encounter (Signed)
Prescription refill request for Xarelto received.  Indication: PAF Last office visit: 12/14/20  Concha Se MD Weight: 57.6kg Age: 86 Scr: 0.8 on 03/02/21 CrCl: 47.60  Based on above findings Xarelto 15mg  daily is the appropriate dose.  Refill approved.

## 2021-03-29 NOTE — Telephone Encounter (Signed)
   Name: Jacqueline Powers  DOB: 01-19-1935  MRN: [id number]   Primary Cardiologist: Julien Nordmann, MD  Chart reviewed as part of pre-operative protocol coverage. Patient was contacted 03/29/2021 in reference to pre-operative risk assessment for pending surgery as outlined below.  Jacqueline Powers was last seen on 12/14/2020 by Dr. Mariah Milling.  Since that day, Jacqueline Powers has done fine from a cardiac standpoint.  Her activity has been somewhat limited since her back injury early July 2022 though she can still complete 4 METS without anginal complaints.  Therefore, based on ACC/AHA guidelines, the patient would be at acceptable risk for the planned procedure without further cardiovascular testing.   The patient was advised that if she develops new symptoms prior to surgery to contact our office to arrange for a follow-up visit, and she verbalized understanding.  Per pharmacy and Dr. Windell Hummingbird recommendations, patient can hold Xarelto 3 days prior to her upcoming kyphoplasty with plans to restart as soon as she is cleared to do so by her neurosurgeon.  I will route this recommendation to the requesting party via Epic fax function and remove from pre-op pool. Please call with questions.  Beatriz Stallion, PA-C 03/29/2021, 11:28 AM

## 2021-03-31 ENCOUNTER — Other Ambulatory Visit: Payer: Self-pay | Admitting: Neurosurgery

## 2021-04-03 NOTE — Pre-Procedure Instructions (Signed)
Jacqueline Powers  04/03/2021      [facility] [place] - [address] [place] [id number] Phone: [phone] Fax: [phone]  [id number] - [place], [place] - [address] [facility] [address] [place] [address] Phone: [phone] Fax: [phone]  [facility] - [place], [place] - [id number] [place] [address] Phone: [phone] Fax: [phone]  [facility] - [place], [place] - [address] [place] [address] Phone: [phone] Fax: [phone]  [facility] [address] - [place], [place] - [id number] [place] [id number] Phone: [phone] Fax: [phone]    Your procedure is scheduled on Nov. 10  [facility] at 11:15 A.M.  Call this number if you have problems the morning of surgery:  [phone]   Remember:  Do not eat or drink after midnight.      Take these medicines the morning of surgery with A SIP OF WATER :             Allopurinol (zyloprim)            Eye drops            Ezetimibe (zetia)            Levothyroxine (synthroid)            Metoprolol succinate (toprol-xl)            Rosuvastatin (crestor)             As needed: astelin nasal spray,diltiazem (cardizem),              7 days prior to surgery STOP taking any Aspirin and xarelto (rivaroxaban) (unless otherwise instructed by your surgeon), Aleve, Naproxen, Ibuprofen, Motrin, Advil, Goody's, BC's, all herbal medications, fish oil, and all vitamins.                Do not wear jewelry, make-up or nail polish.  Do not wear lotions, powders, or perfumes, or deodorant.  Do not shave 48 hours prior to surgery.  Men may shave face and neck.  Do not bring  valuables to the hospital.  [facility] is not responsible for any belongings or valuables.  Contacts, dentures or bridgework may not be worn into surgery.  Leave your suitcase in the car.  After surgery it may be brought to your room.  For patients admitted to the hospital, discharge time will be determined by your treatment team.  Patients discharged the day of surgery will not be allowed to drive home.    Special instructions:   [place]- Preparing For Surgery  Before surgery, you can play an important role. Because skin is not sterile, your skin needs to be as free of germs as possible. You can reduce the number of germs on your skin by washing with CHG (chlorahexidine gluconate) Soap before surgery.  CHG is an antiseptic cleaner which kills germs and bonds with the skin to continue killing germs even after washing.    Oral Hygiene is also important to reduce your risk of infection.  Remember - BRUSH YOUR TEETH THE MORNING OF SURGERY WITH YOUR REGULAR TOOTHPASTE  Please do not use if you have an allergy to CHG or  antibacterial soaps. If your skin becomes reddened/irritated stop using the CHG.  Do not shave (including legs and underarms) for at least 48 hours prior to first CHG shower. It is OK to shave your face.  Please follow these instructions carefully.   Shower the NIGHT BEFORE SURGERY and the MORNING OF SURGERY with CHG.   If you chose to wash your hair, wash your hair first as usual with your normal shampoo.  After you shampoo, rinse your hair and body thoroughly to remove the shampoo.  Use CHG as you would any other liquid soap. You can apply CHG directly to the skin and wash gently with a scrungie or a clean washcloth.   Apply the CHG Soap to your body ONLY FROM THE NECK DOWN.  Do not use on open wounds or open sores. Avoid contact with your eyes, ears, mouth and genitals (private parts). Wash Face and genitals (private parts)  with your normal soap.  Wash thoroughly,  paying special attention to the area where your surgery will be performed.  Thoroughly rinse your body with warm water from the neck down.  DO NOT shower/wash with your normal soap after using and rinsing off the CHG Soap.  Pat yourself dry with a CLEAN TOWEL.  Wear CLEAN PAJAMAS to bed the night before surgery, wear comfortable clothes the morning of surgery  Place CLEAN SHEETS on your bed the night of your first shower and DO NOT SLEEP WITH PETS.    Day of Surgery:  Do not apply any deodorants/lotions.  Please wear clean clothes to the hospital/surgery center.   Remember to brush your teeth WITH YOUR REGULAR TOOTHPASTE.   Please read over the following fact sheets that you were given.

## 2021-04-05 ENCOUNTER — Encounter (HOSPITAL_COMMUNITY): Payer: Self-pay

## 2021-04-05 ENCOUNTER — Other Ambulatory Visit (HOSPITAL_COMMUNITY): Payer: Medicare Other

## 2021-04-05 ENCOUNTER — Other Ambulatory Visit: Payer: Self-pay

## 2021-04-05 ENCOUNTER — Encounter (HOSPITAL_COMMUNITY)
Admission: RE | Admit: 2021-04-05 | Discharge: 2021-04-05 | Disposition: A | Discharge status: Home / Self Care | Payer: Medicare Other | Source: Ambulatory Visit | Attending: Neurosurgery | Admitting: Neurosurgery

## 2021-04-05 VITALS — BP 107/70 | HR 78 | Temp 98.2°F | Resp 18 | Ht 63.0 in | Wt 128.4 lb

## 2021-04-05 DIAGNOSIS — I480 Paroxysmal atrial fibrillation: Secondary | ICD-10-CM | POA: Diagnosis not present

## 2021-04-05 DIAGNOSIS — Z01818 Encounter for other preprocedural examination: Secondary | ICD-10-CM

## 2021-04-05 DIAGNOSIS — Z01812 Encounter for preprocedural laboratory examination: Secondary | ICD-10-CM | POA: Diagnosis present

## 2021-04-05 DIAGNOSIS — E782 Mixed hyperlipidemia: Secondary | ICD-10-CM

## 2021-04-05 DIAGNOSIS — Z20822 Contact with and (suspected) exposure to covid-19: Secondary | ICD-10-CM | POA: Insufficient documentation

## 2021-04-05 HISTORY — DX: Other complications of anesthesia, initial encounter: T88.59XA

## 2021-04-05 LAB — BASIC METABOLIC PANEL
Anion gap: 7 (ref 5–15)
BUN: 20 mg/dL (ref 8–23)
CO2: 28 mmol/L (ref 22–32)
Calcium: 9.1 mg/dL (ref 8.9–10.3)
Chloride: 102 mmol/L (ref 98–111)
Creatinine, Ser: 0.79 mg/dL (ref 0.44–1.00)
GFR, Estimated: >60 mL/min (ref >60)
Glucose, Bld: 97 mg/dL (ref 70–99)
Potassium: 3.9 mmol/L (ref 3.5–5.1)
Sodium: 137 mmol/L (ref 135–145)

## 2021-04-05 LAB — CBC
HCT: 41 % (ref 36.0–46.0)
Hemoglobin: 13.5 g/dL (ref 12.0–15.0)
MCH: 31.8 pg (ref 26.0–34.0)
MCHC: 32.9 g/dL (ref 30.0–36.0)
MCV: 96.5 fL (ref 80.0–100.0)
Platelets: 229 10*3/uL (ref 150–400)
RBC: 4.25 MIL/uL (ref 3.87–5.11)
RDW: 12.7 % (ref 11.5–15.5)
WBC: 6.5 10*3/uL (ref 4.0–10.5)
nRBC: 0 % (ref 0.0–0.2)

## 2021-04-05 LAB — SURGICAL PCR SCREEN
MRSA, PCR: NEGATIVE
Staphylococcus aureus: NEGATIVE

## 2021-04-05 NOTE — Progress Notes (Signed)
PCP - Dr. Marcelino Duster Cardiologist - Dr. Mariah Milling  PPM/ICD - N/a  Chest x-ray - n/a EKG - 12/14/20 Stress Test - 08/17/12 ECHO - denies Cardiac Cath - denies  Sleep Study - denies CPAP - denies  Blood Thinner Instructions: Xarelto: LD 04/03/21 (hold 3 days) Aspirin Instructions:  NPO  COVID TEST- 04/05/21; done in PAT  Anesthesia review: Yes, cardiac clearance received 03/29/21  Patient denies shortness of breath, fever, cough and chest pain at PAT appointment   All instructions explained to the patient, with a verbal understanding of the material. Patient agrees to go over the instructions while at home for a better understanding. Patient also instructed to self quarantine after being tested for COVID-19. The opportunity to ask questions was provided.

## 2021-04-05 NOTE — Pre-Procedure Instructions (Signed)
Surgical Instructions    Your procedure is scheduled on Thursday, November 10th.  Report to [facility] Main Entrance "A" at 8:15 A.M., then check in with the Admitting office.  Call this number if you have problems the morning of surgery:  [phone]   If you have any questions prior to your surgery date call [phone]: Open Monday-Friday 8am-4pm    Remember:  Do not eat or drink after midnight the night before your surgery    Take these medicines the morning of surgery with A SIP OF WATER    Allopurinol (zyloprim)             Eye drops             Ezetimibe (zetia)             Levothyroxine (synthroid)             Metoprolol succinate (toprol-xl)             Rosuvastatin (crestor)              As needed: astelin nasal spray,diltiazem (cardizem),   As of today, STOP taking any Aspirin (unless otherwise instructed by your surgeon) Aleve, Naproxen, Ibuprofen, Motrin, Advil, Goody's, BC's, all herbal medications, fish oil, and all vitamins.                     Do NOT Smoke (Tobacco/Vaping) or drink Alcohol 24 hours prior to your procedure.  If you use a CPAP at night, you may bring all equipment for your overnight stay.   Contacts, glasses, piercing's, hearing aid's, dentures or partials may not be worn into surgery, please bring cases for these belongings.    For patients admitted to the hospital, discharge time will be determined by your treatment team.   Patients discharged the day of surgery will not be allowed to drive home, and someone needs to stay with them for 24 hours.  NO VISITORS WILL BE ALLOWED IN PRE-OP WHERE PATIENTS GET READY FOR SURGERY.  ONLY 1 SUPPORT PERSON MAY BE PRESENT IN THE WAITING ROOM WHILE YOU ARE IN SURGERY.  IF YOU ARE TO BE ADMITTED, ONCE YOU ARE IN YOUR ROOM YOU WILL BE ALLOWED TWO (2) VISITORS.  Minor children may have two parents present. Special consideration for safety and communication needs will be reviewed on a case by case  basis.   Special instructions:   [place]- Preparing For Surgery  Before surgery, you can play an important role. Because skin is not sterile, your skin needs to be as free of germs as possible. You can reduce the number of germs on your skin by washing with CHG (chlorahexidine gluconate) Soap before surgery.  CHG is an antiseptic cleaner which kills germs and bonds with the skin to continue killing germs even after washing.    Oral Hygiene is also important to reduce your risk of infection.  Remember - BRUSH YOUR TEETH THE MORNING OF SURGERY WITH YOUR REGULAR TOOTHPASTE  Please do not use if you have an allergy to CHG or antibacterial soaps. If your skin becomes reddened/irritated stop using the CHG.  Do not shave (including legs and underarms) for at least 48 hours prior to first CHG shower. It is OK to shave your face.  Please follow these instructions carefully.   Shower the NIGHT BEFORE SURGERY and the MORNING OF SURGERY  If you chose to wash your hair, wash your hair first as usual with your normal shampoo.  After you shampoo, rinse your hair and body thoroughly to remove the shampoo.  Use CHG Soap as you would any other liquid soap. You can apply CHG directly to the skin and wash gently with a scrungie or a clean washcloth.   Apply the CHG Soap to your body ONLY FROM THE NECK DOWN.  Do not use on open wounds or open sores. Avoid contact with your eyes, ears, mouth and genitals (private parts). Wash Face and genitals (private parts)  with your normal soap.   Wash thoroughly, paying special attention to the area where your surgery will be performed.  Thoroughly rinse your body with warm water from the neck down.  DO NOT shower/wash with your normal soap after using and rinsing off the CHG Soap.  Pat yourself dry with a CLEAN TOWEL.  Wear CLEAN PAJAMAS to bed the night before surgery  Place CLEAN SHEETS on your bed the night before your surgery  DO NOT SLEEP WITH  PETS.   Day of Surgery: Shower with CHG soap. Do not wear jewelry, make up, nail polish, gel polish, artificial nails, or any other type of covering on natural nails including finger and toenails. If patients have artificial nails, gel coating, etc. that need to be removed by a nail salon please have this removed prior to surgery. Surgery may need to be canceled/delayed if the surgeon/ anesthesia feels like the patient is unable to be adequately monitored. Do not wear lotions, powders, perfumes, or deodorant. Do not shave 48 hours prior to surgery.  Do not bring valuables to the hospital. [facility] is not responsible for any belongings or valuables. Wear Clean/Comfortable clothing the morning of surgery Remember to brush your teeth WITH YOUR REGULAR TOOTHPASTE.   Please read over the following fact sheets that you were given.   3 days prior to your procedure or After your COVID test   You are not required to quarantine however you are required to wear a well-fitting mask when you are out and around people not in your household. If your mask becomes wet or soiled, replace with a new one.   Wash your hands often with soap and water for 20 seconds or clean your hands with an alcohol-based hand sanitizer that contains at least 60% alcohol.   Do not share personal items.   Notify your provider:  o if you are in close contact with someone who has COVID  o or if you develop a fever of 100.4 or greater, sneezing, cough, sore throat, shortness of breath or body aches.

## 2021-04-06 LAB — SARS CORONAVIRUS 2 (TAT 6-24 HRS): SARS Coronavirus 2: NEGATIVE

## 2021-04-06 NOTE — Anesthesia Preprocedure Evaluation (Deleted)
Anesthesia Evaluation Anesthesia Physical Anesthesia Plan  ASA:   Anesthesia Plan:    Post-op Pain Management:    Induction:   PONV Risk Score and Plan:   Airway Management Planned:   Additional Equipment:   Intra-op Plan:   Post-operative Plan:   Informed Consent:   Plan Discussed with:   Anesthesia Plan Comments: (PAT note written 04/06/2021 by Shonna Chock, PA-C. )        Anesthesia Quick Evaluation

## 2021-04-06 NOTE — Progress Notes (Signed)
Anesthesia Chart Review:  Case: [id number] Date/Time: 04/08/21 1000   Procedure: KYPHOPLASTY T12, L1   Anesthesia type: General   Pre-op diagnosis: WEDGE COMPRESSION FRACTURE OF THORACIC 11-12 VERTEBRA   [facility] ROOM 20 / [facility] OR   Surgeons: Tressie Stalker, MD       DISCUSSION: Patient is an 86 year old female scheduled for the above procedure.  History includes never smoker, PAF(~ 2016/2017), HLD, asthma, GERD, hiatal hernia, glaucoma, IBS, hypothyroidism due to Hashimoto's thyroiditis (05/2002, s/p thyroid nodule resection), TIA (06/2013), spinal surgery (neck, back). Reported history of prolonged emergence.  Preoperative cardiology input outlined on 03/29/2021 by Judy Pimple, PA-C, "Patient was contacted 03/29/2021 in reference to pre-operative risk assessment for pending surgery as outlined below.  Veronia Laprise Boulais was last seen on 12/14/2020 by Dr. Mariah Milling.  Since that day, MELINE RUSSAW has done fine from a cardiac standpoint.  Her activity has been somewhat limited since her back injury early July 2022 though she can still complete 4 METS without anginal complaints.   Therefore, based on ACC/AHA guidelines, the patient would be at acceptable risk for the planned procedure without further cardiovascular testing.Marland KitchenMarland KitchenPer pharmacy and Dr. Windell Hummingbird recommendations, patient can hold Xarelto 3 days prior to her upcoming kyphoplasty with plans to restart as soon as she is cleared to do so by her neurosurgeon." Per PAT RN documentation, last Xarelto 04/03/21.   04/05/2021 preoperative COVID-19 test negative.  Anesthesia team to evaluate on the day of surgery.   VS: BP 107/70   Pulse 78   Temp 36.8 C (Oral)   Resp 18   Ht 5\' 3"  (1.6 m)   Wt 58.2 kg   SpO2 99%   BMI 22.75 kg/m    PROVIDERS: , MD is PCP [facility]) - [facility], MD is cardiologist - Julien Nordmann, MD is vascular surgeon. 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  As needed follow-up recommended.   07/26/18, MD is GI [facility]) - [facility], MD is endocrinologist [facility])   LABS: Labs reviewed: Acceptable for surgery. (all labs ordered are listed, but only abnormal results are displayed)  Labs Reviewed  SURGICAL PCR SCREEN  SARS CORONAVIRUS 2 (TAT 6-24 HRS)  BASIC METABOLIC PANEL  CBC    OTHER: ERCP 09/23/20 ([facility]): Impression:  - Cholangiogram with air bubbles vs small stones were  found in the distal bile duct. A biliary  sphincterotomy was performed. The biliary tree was  swept and sludge was found. Clear occlusion  cholangiogram at the end of the procedure. - The entire main bile duct was mildly dilated. - A pancreatogram was not attempted. - Consider referral to a general surgeon for  consideration of cholecystectomy (will leave this  decision up to the referring providers).  EGD 07/21/20: Impression: - 5 cm hiatal hernia. - Normal stomach. Biopsied. (Pathology: gastric oxyntic mucosa with no significant histopathologic change, negative for H. pylori, dysplasia, malignancy) - Normal examined duodenum.  Colonoscopy 07/21/20: Impression: - The examined portion of the ileum was normal. - Non-bleeding internal hemorrhoids. - The examination was otherwise normal on direct and retroflexion views. - No specimens collected.   IMAGES: MRI L-spine 12/10/20: IMPRESSION: - Acute or  subacute compression fractures at T12 and L1 with mild loss of height and no significant endplate retropulsion. - Multilevel degenerative changes as detailed above. Most notably, right foraminal disc protrusion at L4-L5 with compression of  exiting L4 nerve root.    EKG: 12/14/20: NSR.  Cannot rule out anterior infarct, age undetermined.   CV: Coronary Calcium Scoring 03/01/2016:  IMPRESSION: Coronary calcium score of 17. This was 32 percentile for age and sex matched control.   Cardiac event monitor 06/26/2014-07/25/2014: Normal sinus rhythm with episode of atrial fibrillation on 07/09/2014.   US Carotid 07/12/2013: IMPRESSION:  - Mild smooth plaque formation is noted in right carotid bulb and  proximal right internal carotid artery consistent with less than 50%  diameter stenosis.  - Minimal calcified plaque is noted in the left carotid bulb and  proximal left internal carotid artery consistent with less than 50%  diameter stenosis.    ETT 08/17/2012: FINAL IMPRESSION:  Normal exercise stress test. No significant EKG changes  concerning for ischemia.  Good exercise tolerance.    Past Medical History:  Diagnosis Date   Arthritis    Asthma    Carpal tunnel syndrome    Colonic polyp    Complication of anesthesia    "took me a long to wake up"   Degenerative joint disease of cervical and lumbar spine    Dysrhythmia    GERD (gastroesophageal reflux disease)    Glaucoma    Hashimoto's thyroiditis    a. 05/2002 s/p resection of thyroid nodule-->on replacement.   Hiatal hernia    History of depression    Hyperlipidemia    Hypothyroidism    Irritable bowel syndrome    Osteoporosis    PAF (paroxysmal atrial fibrillation) (HCC)    a. CHA2DS2VASc = 5-->xarelto;     Perennial allergic rhinitis    Right knee meniscal tear    TIA (transient ischemic attack)    a. 06/2013: LUE/LLE wkns x 15 mins, MRI neg for CVA.    Past Surgical History:  Procedure Laterality Date   BACK SURGERY     L5   CATARACT EXTRACTION     CERVICAL CONE BIOPSY     CERVICAL FUSION     COLONOSCOPY     COLONOSCOPY WITH PROPOFOL N/A 01/20/2017   Procedure: COLONOSCOPY WITH PROPOFOL;  Surgeon: Scot Jun, MD;  Location: [facility]  ENDOSCOPY;  Service: Endoscopy;  Laterality: N/A;   COLONOSCOPY WITH PROPOFOL N/A 07/21/2020   Procedure: COLONOSCOPY WITH PROPOFOL;  Surgeon: Regis Bill, MD;  Location: [facility] ENDOSCOPY;  Service: Endoscopy;  Laterality: N/A;   ESOPHAGOGASTRODUODENOSCOPY N/A 07/21/2020   Procedure: ESOPHAGOGASTRODUODENOSCOPY (EGD);  Surgeon: Regis Bill, MD;  Location: [facility] ENDOSCOPY;  Service: Endoscopy;  Laterality: N/A;   ESOPHAGOGASTRODUODENOSCOPY (EGD) WITH PROPOFOL N/A 01/20/2017   Procedure: ESOPHAGOGASTRODUODENOSCOPY (EGD) WITH PROPOFOL;  Surgeon: Scot Jun, MD;  Location: [facility] ENDOSCOPY;  Service: Endoscopy;  Laterality: N/A;   ESOPHAGOGASTRODUODENOSCOPY (EGD) WITH PROPOFOL N/A 04/28/2017   Procedure: ESOPHAGOGASTRODUODENOSCOPY (EGD) WITH PROPOFOL;  Surgeon: Scot Jun, MD;  Location: [facility] ENDOSCOPY;  Service: Endoscopy;  Laterality: N/A;   TONSILLECTOMY      MEDICATIONS:  allopurinol (ZYLOPRIM) 100 MG tablet   azelastine (ASTELIN) 0.1 % nasal spray   Cholecalciferol (VITAMIN D) 50 MCG (2000 UT) CAPS   diltiazem (CARDIZEM) 30 MG tablet   dorzolamide-timolol (COSOPT) 22.3-6.8 MG/ML ophthalmic solution   ezetimibe (ZETIA) 10 MG tablet   fluticasone (FLONASE) 50 MCG/ACT nasal spray   levothyroxine (SYNTHROID, LEVOTHROID) 25 MCG  tablet   LUMIGAN 0.01 % SOLN   metoprolol succinate (TOPROL-XL) 25 MG 24 hr tablet   omeprazole (PRILOSEC) 40 MG capsule   Rivaroxaban (XARELTO) 15 MG TABS tablet   rosuvastatin (CRESTOR) 5 MG tablet   VYZULTA 0.024 % SOLN   No current facility-administered medications for this encounter.    Shonna Chock, PA-C Surgical Short Stay/Anesthesiology [facility] ([place]) Phone [phone] [facility] Phone [phone] 04/06/2021 6:20 PM

## 2021-04-08 ENCOUNTER — Ambulatory Visit (HOSPITAL_COMMUNITY): Payer: Medicare Other | Admitting: Vascular Surgery

## 2021-04-08 ENCOUNTER — Ambulatory Visit (HOSPITAL_COMMUNITY)
Admission: RE | Admit: 2021-04-08 | Discharge: 2021-04-08 | Disposition: A | Discharge status: Home / Self Care | Payer: Medicare Other | Source: Ambulatory Visit | Attending: Neurosurgery | Admitting: Neurosurgery

## 2021-04-08 ENCOUNTER — Ambulatory Visit (HOSPITAL_COMMUNITY): Payer: Medicare Other

## 2021-04-08 ENCOUNTER — Ambulatory Visit (HOSPITAL_COMMUNITY): Payer: Medicare Other | Admitting: Anesthesiology

## 2021-04-08 ENCOUNTER — Encounter (HOSPITAL_COMMUNITY): Payer: Self-pay | Admitting: Neurosurgery

## 2021-04-08 ENCOUNTER — Encounter (HOSPITAL_COMMUNITY)
Admission: RE | Disposition: A | Discharge status: Home / Self Care | Payer: Self-pay | Source: Ambulatory Visit | Attending: Neurosurgery

## 2021-04-08 DIAGNOSIS — E063 Autoimmune thyroiditis: Secondary | ICD-10-CM | POA: Diagnosis not present

## 2021-04-08 DIAGNOSIS — S32000G Wedge compression fracture of unspecified lumbar vertebra, subsequent encounter for fracture with delayed healing: Secondary | ICD-10-CM | POA: Diagnosis present

## 2021-04-08 DIAGNOSIS — M1990 Unspecified osteoarthritis, unspecified site: Secondary | ICD-10-CM | POA: Diagnosis not present

## 2021-04-08 DIAGNOSIS — Z79899 Other long term (current) drug therapy: Secondary | ICD-10-CM | POA: Insufficient documentation

## 2021-04-08 DIAGNOSIS — I480 Paroxysmal atrial fibrillation: Secondary | ICD-10-CM | POA: Insufficient documentation

## 2021-04-08 DIAGNOSIS — Z79890 Hormone replacement therapy (postmenopausal): Secondary | ICD-10-CM | POA: Diagnosis not present

## 2021-04-08 DIAGNOSIS — Z419 Encounter for procedure for purposes other than remedying health state, unspecified: Secondary | ICD-10-CM

## 2021-04-08 DIAGNOSIS — E785 Hyperlipidemia, unspecified: Secondary | ICD-10-CM | POA: Diagnosis not present

## 2021-04-08 DIAGNOSIS — J45909 Unspecified asthma, uncomplicated: Secondary | ICD-10-CM | POA: Insufficient documentation

## 2021-04-08 DIAGNOSIS — M8008XA Age-related osteoporosis with current pathological fracture, vertebra(e), initial encounter for fracture: Secondary | ICD-10-CM | POA: Insufficient documentation

## 2021-04-08 HISTORY — PX: KYPHOPLASTY: SHX5884

## 2021-04-08 SURGERY — KYPHOPLASTY
Anesthesia: General

## 2021-04-08 MED ORDER — ZOLPIDEM TARTRATE 5 MG PO TABS: 5.0000 mg | ORAL_TABLET | Freq: Every evening | ORAL | Status: DC | PRN

## 2021-04-08 MED ORDER — MORPHINE SULFATE (PF) 4 MG/ML IV SOLN: 4.0000 mg | INTRAVENOUS | Status: DC | PRN

## 2021-04-08 MED ORDER — CHLORHEXIDINE GLUCONATE CLOTH 2 % EX PADS: 6.0000 | MEDICATED_PAD | Freq: Once | CUTANEOUS | Status: DC

## 2021-04-08 MED ORDER — ROCURONIUM BROMIDE 10 MG/ML (PF) SYRINGE
PREFILLED_SYRINGE | INTRAVENOUS | Status: AC
  Filled 2021-04-08: qty 10

## 2021-04-08 MED ORDER — MENTHOL 3 MG MT LOZG: 1.0000 | LOZENGE | OROMUCOSAL | Status: DC | PRN

## 2021-04-08 MED ORDER — DILTIAZEM HCL 30 MG PO TABS: 30.0000 mg | ORAL_TABLET | Freq: Three times a day (TID) | ORAL | Status: DC | PRN

## 2021-04-08 MED ORDER — ACETAMINOPHEN 500 MG PO TABS
1000.0000 mg | ORAL_TABLET | Freq: Four times a day (QID) | ORAL | Status: DC
  Administered 2021-04-08 (×2): 1000 mg via ORAL
  Filled 2021-04-08 (×2): qty 2

## 2021-04-08 MED ORDER — AZELASTINE HCL 0.1 % NA SOLN: 1.0000 | Freq: Two times a day (BID) | NASAL | Status: DC | PRN

## 2021-04-08 MED ORDER — BACITRACIN ZINC 500 UNIT/GM EX OINT
TOPICAL_OINTMENT | CUTANEOUS | Status: AC
  Filled 2021-04-08: qty 28.35

## 2021-04-08 MED ORDER — ACETAMINOPHEN 650 MG RE SUPP: 650.0000 mg | RECTAL | Status: DC | PRN

## 2021-04-08 MED ORDER — LIDOCAINE 2% (20 MG/ML) 5 ML SYRINGE
INTRAMUSCULAR | Status: DC | PRN
  Administered 2021-04-08: 100 mg via INTRAVENOUS

## 2021-04-08 MED ORDER — LATANOPROSTENE BUNOD 0.024 % OP SOLN: 1.0000 [drp] | Freq: Every day | OPHTHALMIC | Status: DC

## 2021-04-08 MED ORDER — CHLORHEXIDINE GLUCONATE 0.12 % MT SOLN
15.0000 mL | Freq: Once | OROMUCOSAL | Status: AC
  Administered 2021-04-08: 15 mL via OROMUCOSAL
  Filled 2021-04-08: qty 15

## 2021-04-08 MED ORDER — FENTANYL CITRATE (PF) 250 MCG/5ML IJ SOLN
INTRAMUSCULAR | Status: DC | PRN
  Administered 2021-04-08: 100 ug via INTRAVENOUS
  Administered 2021-04-08: 25 ug via INTRAVENOUS

## 2021-04-08 MED ORDER — FENTANYL CITRATE (PF) 250 MCG/5ML IJ SOLN
INTRAMUSCULAR | Status: AC
  Filled 2021-04-08: qty 5

## 2021-04-08 MED ORDER — FENTANYL CITRATE (PF) 100 MCG/2ML IJ SOLN
INTRAMUSCULAR | Status: AC
  Filled 2021-04-08: qty 2

## 2021-04-08 MED ORDER — ONDANSETRON HCL 4 MG/2ML IJ SOLN
INTRAMUSCULAR | Status: DC | PRN
  Administered 2021-04-08: 4 mg via INTRAVENOUS

## 2021-04-08 MED ORDER — LACTATED RINGERS IV SOLN: INTRAVENOUS | Status: DC

## 2021-04-08 MED ORDER — SODIUM CHLORIDE 0.9% FLUSH: 3.0000 mL | INTRAVENOUS | Status: DC | PRN

## 2021-04-08 MED ORDER — OXYCODONE-ACETAMINOPHEN 5-325 MG PO TABS: 1.0000 | ORAL_TABLET | ORAL | 0 refills | Status: DC | PRN

## 2021-04-08 MED ORDER — RIVAROXABAN 15 MG PO TABS: ORAL_TABLET | ORAL | 1 refills | Status: DC

## 2021-04-08 MED ORDER — FLUTICASONE PROPIONATE 50 MCG/ACT NA SUSP: 2.0000 | Freq: Every evening | NASAL | Status: DC | PRN

## 2021-04-08 MED ORDER — PHENYLEPHRINE 40 MCG/ML (10ML) SYRINGE FOR IV PUSH (FOR BLOOD PRESSURE SUPPORT)
PREFILLED_SYRINGE | INTRAVENOUS | Status: DC | PRN
  Administered 2021-04-08: 40 ug via INTRAVENOUS
  Administered 2021-04-08 (×3): 80 ug via INTRAVENOUS
  Administered 2021-04-08: 40 ug via INTRAVENOUS

## 2021-04-08 MED ORDER — SODIUM CHLORIDE 0.9 % IV SOLN: 250.0000 mL | INTRAVENOUS | Status: DC

## 2021-04-08 MED ORDER — ACETAMINOPHEN 500 MG PO TABS
1000.0000 mg | ORAL_TABLET | Freq: Once | ORAL | Status: AC
  Administered 2021-04-08: 1000 mg via ORAL
  Filled 2021-04-08: qty 2

## 2021-04-08 MED ORDER — PROPOFOL 1000 MG/100ML IV EMUL
INTRAVENOUS | Status: AC
  Filled 2021-04-08: qty 100

## 2021-04-08 MED ORDER — OXYCODONE HCL 5 MG PO TABS: 5.0000 mg | ORAL_TABLET | ORAL | Status: DC | PRN

## 2021-04-08 MED ORDER — AMISULPRIDE (ANTIEMETIC) 5 MG/2ML IV SOLN: 10.0000 mg | Freq: Once | INTRAVENOUS | Status: DC | PRN

## 2021-04-08 MED ORDER — LIDOCAINE 2% (20 MG/ML) 5 ML SYRINGE
INTRAMUSCULAR | Status: AC
  Filled 2021-04-08: qty 5

## 2021-04-08 MED ORDER — SODIUM CHLORIDE 0.9% FLUSH
3.0000 mL | Freq: Two times a day (BID) | INTRAVENOUS | Status: DC
  Administered 2021-04-08: 3 mL via INTRAVENOUS

## 2021-04-08 MED ORDER — PROMETHAZINE HCL 25 MG/ML IJ SOLN: 6.2500 mg | INTRAMUSCULAR | Status: DC | PRN

## 2021-04-08 MED ORDER — IOPAMIDOL (ISOVUE-300) INJECTION 61%
INTRAVENOUS | Status: DC | PRN
  Administered 2021-04-08: 20 mL

## 2021-04-08 MED ORDER — PROPOFOL 10 MG/ML IV BOLUS
INTRAVENOUS | Status: DC | PRN
  Administered 2021-04-08 (×2): 10 mg via INTRAVENOUS
  Administered 2021-04-08: 110 mg via INTRAVENOUS

## 2021-04-08 MED ORDER — DORZOLAMIDE HCL-TIMOLOL MAL 2-0.5 % OP SOLN: 1.0000 [drp] | Freq: Two times a day (BID) | OPHTHALMIC | Status: DC

## 2021-04-08 MED ORDER — BUPIVACAINE-EPINEPHRINE 0.5% -1:200000 IJ SOLN
INTRAMUSCULAR | Status: DC | PRN
  Administered 2021-04-08: 10 mL

## 2021-04-08 MED ORDER — PROPOFOL 10 MG/ML IV BOLUS
INTRAVENOUS | Status: AC
  Filled 2021-04-08: qty 20

## 2021-04-08 MED ORDER — ONDANSETRON HCL 4 MG/2ML IJ SOLN
INTRAMUSCULAR | Status: AC
  Filled 2021-04-08: qty 2

## 2021-04-08 MED ORDER — EZETIMIBE 10 MG PO TABS: 10.0000 mg | ORAL_TABLET | ORAL | Status: DC

## 2021-04-08 MED ORDER — ACETAMINOPHEN 325 MG PO TABS: 650.0000 mg | ORAL_TABLET | ORAL | Status: DC | PRN

## 2021-04-08 MED ORDER — PHENOL 1.4 % MT LIQD: 1.0000 | OROMUCOSAL | Status: DC | PRN

## 2021-04-08 MED ORDER — OXYCODONE HCL 5 MG PO TABS
10.0000 mg | ORAL_TABLET | ORAL | Status: DC | PRN
  Administered 2021-04-08 (×2): 10 mg via ORAL
  Filled 2021-04-08: qty 2

## 2021-04-08 MED ORDER — LEVOTHYROXINE SODIUM 25 MCG PO TABS: 25.0000 ug | ORAL_TABLET | Freq: Every day | ORAL | Status: DC

## 2021-04-08 MED ORDER — OXYCODONE HCL 5 MG PO TABS
ORAL_TABLET | ORAL | Status: AC
  Filled 2021-04-08: qty 2

## 2021-04-08 MED ORDER — CEFAZOLIN SODIUM 1 G IJ SOLR
INTRAMUSCULAR | Status: AC
  Filled 2021-04-08: qty 20

## 2021-04-08 MED ORDER — PHENYLEPHRINE 40 MCG/ML (10ML) SYRINGE FOR IV PUSH (FOR BLOOD PRESSURE SUPPORT)
PREFILLED_SYRINGE | INTRAVENOUS | Status: AC
  Filled 2021-04-08: qty 10

## 2021-04-08 MED ORDER — BISACODYL 10 MG RE SUPP: 10.0000 mg | Freq: Every day | RECTAL | Status: DC | PRN

## 2021-04-08 MED ORDER — ORAL CARE MOUTH RINSE: 15.0000 mL | Freq: Once | OROMUCOSAL | Status: AC

## 2021-04-08 MED ORDER — DEXAMETHASONE SODIUM PHOSPHATE 10 MG/ML IJ SOLN
INTRAMUSCULAR | Status: AC
  Filled 2021-04-08: qty 1

## 2021-04-08 MED ORDER — LATANOPROST 0.005 % OP SOLN: 1.0000 [drp] | Freq: Every day | OPHTHALMIC | Status: DC

## 2021-04-08 MED ORDER — FENTANYL CITRATE (PF) 100 MCG/2ML IJ SOLN
25.0000 ug | INTRAMUSCULAR | Status: DC | PRN
  Administered 2021-04-08: 50 ug via INTRAVENOUS

## 2021-04-08 MED ORDER — ALLOPURINOL 100 MG PO TABS: 100.0000 mg | ORAL_TABLET | Freq: Every day | ORAL | Status: DC

## 2021-04-08 MED ORDER — METOPROLOL SUCCINATE ER 25 MG PO TB24: 25.0000 mg | ORAL_TABLET | Freq: Every day | ORAL | Status: DC

## 2021-04-08 MED ORDER — ONDANSETRON HCL 4 MG PO TABS
4.0000 mg | ORAL_TABLET | Freq: Four times a day (QID) | ORAL | Status: DC | PRN
  Administered 2021-04-08: 4 mg via ORAL
  Filled 2021-04-08: qty 1

## 2021-04-08 MED ORDER — CEFAZOLIN SODIUM-DEXTROSE 2-3 GM-%(50ML) IV SOLR
INTRAVENOUS | Status: DC | PRN
  Administered 2021-04-08: 2 g via INTRAVENOUS

## 2021-04-08 MED ORDER — ONDANSETRON HCL 4 MG/2ML IJ SOLN
4.0000 mg | Freq: Four times a day (QID) | INTRAMUSCULAR | Status: DC | PRN
  Administered 2021-04-08: 4 mg via INTRAVENOUS
  Filled 2021-04-08: qty 2

## 2021-04-08 MED ORDER — DOCUSATE SODIUM 100 MG PO CAPS: 100.0000 mg | ORAL_CAPSULE | Freq: Two times a day (BID) | ORAL | 0 refills | Status: DC

## 2021-04-08 MED ORDER — ONDANSETRON HCL 4 MG PO TABS: 4.0000 mg | ORAL_TABLET | Freq: Four times a day (QID) | ORAL | 0 refills | Status: DC | PRN

## 2021-04-08 MED ORDER — 0.9 % SODIUM CHLORIDE (POUR BTL) OPTIME
TOPICAL | Status: DC | PRN
  Administered 2021-04-08: 2000 mL

## 2021-04-08 MED ORDER — CEFAZOLIN SODIUM-DEXTROSE 2-4 GM/100ML-% IV SOLN
2.0000 g | Freq: Three times a day (TID) | INTRAVENOUS | Status: DC
  Filled 2021-04-08: qty 100

## 2021-04-08 MED ORDER — ROCURONIUM BROMIDE 10 MG/ML (PF) SYRINGE
PREFILLED_SYRINGE | INTRAVENOUS | Status: DC | PRN
  Administered 2021-04-08: 60 mg via INTRAVENOUS

## 2021-04-08 MED ORDER — PANTOPRAZOLE SODIUM 40 MG PO TBEC: 80.0000 mg | DELAYED_RELEASE_TABLET | Freq: Every day | ORAL | Status: DC

## 2021-04-08 MED ORDER — SUGAMMADEX SODIUM 200 MG/2ML IV SOLN
INTRAVENOUS | Status: DC | PRN
Start: 2021-04-08 — End: 2021-04-08
  Administered 2021-04-08: 200 mg via INTRAVENOUS

## 2021-04-08 MED ORDER — BUPIVACAINE-EPINEPHRINE 0.5% -1:200000 IJ SOLN
INTRAMUSCULAR | Status: AC
  Filled 2021-04-08: qty 1

## 2021-04-08 MED ORDER — DEXAMETHASONE SODIUM PHOSPHATE 10 MG/ML IJ SOLN
INTRAMUSCULAR | Status: DC | PRN
Start: 2021-04-08 — End: 2021-04-08
  Administered 2021-04-08: 8 mg via INTRAVENOUS

## 2021-04-08 MED ORDER — VANCOMYCIN HCL IN DEXTROSE 1-5 GM/200ML-% IV SOLN
1000.0000 mg | INTRAVENOUS | Status: AC
  Administered 2021-04-08: 1000 mg via INTRAVENOUS
  Filled 2021-04-08: qty 200

## 2021-04-08 MED ORDER — ROSUVASTATIN CALCIUM 5 MG PO TABS: 5.0000 mg | ORAL_TABLET | Freq: Every day | ORAL | Status: DC

## 2021-04-08 MED ORDER — DOCUSATE SODIUM 100 MG PO CAPS: 100.0000 mg | ORAL_CAPSULE | Freq: Two times a day (BID) | ORAL | Status: DC

## 2021-04-08 SURGICAL SUPPLY — 31 items
BAG COUNTER SPONGE SURGICOUNT (BAG) ×2 IMPLANT
BENZOIN TINCTURE PRP APPL 2/3 (GAUZE/BANDAGES/DRESSINGS) ×2 IMPLANT
BLADE SURG 15 STRL LF DISP TIS (BLADE) ×1 IMPLANT
BLADE SURG 15 STRL SS (BLADE) ×2
CEMENT KYPHON C01A KIT/MIXER (Cement) ×4 IMPLANT
DIFFUSER DRILL AIR PNEUMATIC (MISCELLANEOUS) ×2 IMPLANT
DRAPE C-ARM 42X72 X-RAY (DRAPES) ×2 IMPLANT
DRAPE HALF SHEET 40X57 (DRAPES) ×2 IMPLANT
DRAPE INCISE IOBAN 66X45 STRL (DRAPES) ×2 IMPLANT
DRAPE LAPAROTOMY 100X72X124 (DRAPES) ×2 IMPLANT
DRAPE WARM FLUID 44X44 (DRAPES) ×2 IMPLANT
DRSG OPSITE POSTOP 4X6 (GAUZE/BANDAGES/DRESSINGS) ×2 IMPLANT
GAUZE 4X4 16PLY ~~LOC~~+RFID DBL (SPONGE) ×2 IMPLANT
GAUZE SPONGE 4X4 12PLY STRL (GAUZE/BANDAGES/DRESSINGS) ×2 IMPLANT
GLOVE SURG ENC MOIS LTX SZ8 (GLOVE) ×2 IMPLANT
GLOVE SURG ENC MOIS LTX SZ8.5 (GLOVE) ×2 IMPLANT
GOWN STRL REUS W/ TWL XL LVL3 (GOWN DISPOSABLE) ×2 IMPLANT
GOWN STRL REUS W/TWL XL LVL3 (GOWN DISPOSABLE) ×4
KIT BASIN OR (CUSTOM PROCEDURE TRAY) ×2 IMPLANT
KIT TURNOVER KIT B (KITS) ×2 IMPLANT
NEEDLE HYPO 22GX1.5 SAFETY (NEEDLE) ×2 IMPLANT
NS IRRIG 1000ML POUR BTL (IV SOLUTION) ×2 IMPLANT
PACK EENT II TURBAN DRAPE (CUSTOM PROCEDURE TRAY) ×2 IMPLANT
PAD ARMBOARD 7.5X6 YLW CONV (MISCELLANEOUS) ×6 IMPLANT
SPECIMEN JAR SMALL (MISCELLANEOUS) ×2 IMPLANT
STRIP CLOSURE SKIN 1/2X4 (GAUZE/BANDAGES/DRESSINGS) ×2 IMPLANT
SUT VIC AB 3-0 SH 8-18 (SUTURE) ×2 IMPLANT
SYR CONTROL 10ML LL (SYRINGE) ×2 IMPLANT
TOWEL GREEN STERILE (TOWEL DISPOSABLE) ×2 IMPLANT
TOWEL GREEN STERILE FF (TOWEL DISPOSABLE) ×2 IMPLANT
TRAY KYPHOPAK 15/3 ONESTEP 1ST (MISCELLANEOUS) ×4 IMPLANT

## 2021-04-08 NOTE — H&P (Signed)
Subjective: The patient is an 86 year old white female who had the immediate onset of back pain while riding a horse in June 2022.  She was worked up with x-rays and MRI which demonstrated a T12 and L1 compression fracture.  She failed medical management.  I discussed the various treatment options with her.  She has decided proceed with a kyphoplasty.  Past Medical History:  Diagnosis Date   Arthritis    Asthma    Carpal tunnel syndrome    Colonic polyp    Complication of anesthesia    "took me a long to wake up"   Degenerative joint disease of cervical and lumbar spine    Dysrhythmia    GERD (gastroesophageal reflux disease)    Glaucoma    Hashimoto's thyroiditis    a. 05/2002 s/p resection of thyroid nodule-->on replacement.   Hiatal hernia    History of depression    Hyperlipidemia    Hypothyroidism    Irritable bowel syndrome    Osteoporosis    PAF (paroxysmal atrial fibrillation) (HCC)    a. CHA2DS2VASc = 5-->xarelto;     Perennial allergic rhinitis    Right knee meniscal tear    TIA (transient ischemic attack)    a. 06/2013: LUE/LLE wkns x 15 mins, MRI neg for CVA.    Past Surgical History:  Procedure Laterality Date   BACK SURGERY     L5   CATARACT EXTRACTION     CERVICAL CONE BIOPSY     CERVICAL FUSION     COLONOSCOPY     COLONOSCOPY WITH PROPOFOL N/A 01/20/2017   Procedure: COLONOSCOPY WITH PROPOFOL;  Surgeon: Manya Silvas, MD;  Location: [facility] ENDOSCOPY;  Service: Endoscopy;  Laterality: N/A;   COLONOSCOPY WITH PROPOFOL N/A 07/21/2020   Procedure: COLONOSCOPY WITH PROPOFOL;  Surgeon: Lesly Rubenstein, MD;  Location: [facility] ENDOSCOPY;  Service: Endoscopy;  Laterality: N/A;   ESOPHAGOGASTRODUODENOSCOPY N/A 07/21/2020   Procedure: ESOPHAGOGASTRODUODENOSCOPY (EGD);  Surgeon: Lesly Rubenstein, MD;  Location: [facility] ENDOSCOPY;  Service: Endoscopy;  Laterality: N/A;   ESOPHAGOGASTRODUODENOSCOPY (EGD) WITH PROPOFOL N/A 01/20/2017   Procedure:  ESOPHAGOGASTRODUODENOSCOPY (EGD) WITH PROPOFOL;  Surgeon: Manya Silvas, MD;  Location: [facility] ENDOSCOPY;  Service: Endoscopy;  Laterality: N/A;   ESOPHAGOGASTRODUODENOSCOPY (EGD) WITH PROPOFOL N/A 04/28/2017   Procedure: ESOPHAGOGASTRODUODENOSCOPY (EGD) WITH PROPOFOL;  Surgeon: Manya Silvas, MD;  Location: [facility] ENDOSCOPY;  Service: Endoscopy;  Laterality: N/A;   TONSILLECTOMY      Allergies  Allergen Reactions   Amoxicillin-Pot Clavulanate Nausea And Vomiting    Has patient had a PCN reaction causing immediate rash, facial/tongue/throat swelling, SOB or lightheadedness with hypotension: No Has patient had a PCN reaction causing severe rash involving mucus membranes or skin necrosis: No Has patient had a PCN reaction that required hospitalization: No Has patient had a PCN reaction occurring within the last 10 years: Yes  If all of the above answers are "NO", then may proceed with Cephalosporin use.    Codeine Nausea And Vomiting   Septra [Sulfamethoxazole-Trimethoprim] Other (See Comments)    Reaction: unknown   Simvastatin Other (See Comments)    "did something to muscles"   Sulfa Antibiotics Other (See Comments)    Reaction: unknown   Tramadol Itching and Rash    Social History   Tobacco Use   Smoking status: Never   Smokeless tobacco: Never  Substance Use Topics   Alcohol use: Yes    Comment: occasional    Family History  Problem Relation Age of Onset  Hypertension Sister    Prior to Admission medications   Medication Sig Start Date End Date Taking? Authorizing Provider  allopurinol (ZYLOPRIM) 100 MG tablet Take 100 mg by mouth daily.   Yes [provider]  azelastine (ASTELIN) 0.1 % nasal spray Place 1 spray into both nostrils 2 (two) times daily as needed for allergies. Use in each nostril as directed   Yes [provider]  Cholecalciferol (VITAMIN D) 50 MCG (2000 UT) CAPS Take 4,000 Units by mouth daily.   Yes [provider]   diltiazem (CARDIZEM) 30 MG tablet Take 1 tablet (30 mg total) by mouth 3 (three) times daily as needed (As needed for fast heart rates.). 12/14/20  Yes Gollan, Tollie Pizza, MD  dorzolamide-timolol (COSOPT) 22.3-6.8 MG/ML ophthalmic solution Place 1 drop into both eyes 2 (two) times daily.   Yes [provider]  ezetimibe (ZETIA) 10 MG tablet Take 1 tablet (10 mg total) by mouth daily. Patient taking differently: Take 10 mg by mouth 3 (three) times a week. 12/14/20  Yes Gollan, Tollie Pizza, MD  fluticasone (FLONASE) 50 MCG/ACT nasal spray Place 2 sprays into both nostrils at bedtime as needed for allergies.  02/17/14  Yes [provider]  levothyroxine (SYNTHROID, LEVOTHROID) 25 MCG tablet Take 25 mcg by mouth daily before breakfast.  06/12/14  Yes [provider]  LUMIGAN 0.01 % SOLN Place 1 drop into the left eye at bedtime. 05/15/15  Yes [provider]  metoprolol succinate (TOPROL-XL) 25 MG 24 hr tablet Take 1 tablet (25 mg total) by mouth daily. 12/14/20  Yes Antonieta Iba, MD  omeprazole (PRILOSEC) 40 MG capsule Take 40 mg by mouth daily as needed (acid reflux). 03/07/19  Yes [provider]  Rivaroxaban (XARELTO) 15 MG TABS tablet TAKE 1 TABLET BY MOUTH EVERY DAY WITH SUPPER 03/29/21  Yes Gollan, Tollie Pizza, MD  rosuvastatin (CRESTOR) 5 MG tablet Take 1 tablet (5 mg total) by mouth daily. TAKE 1 TABLET DAILY AT 6 P.M. Patient taking differently: Take 5 mg by mouth 3 (three) times a week. 12/14/20  Yes Gollan, Tollie Pizza, MD  VYZULTA 0.024 % SOLN Place 1 drop into the right eye at bedtime. 01/12/21  Yes [provider]     Review of Systems  Positive ROS: As above  All other systems have been reviewed and were otherwise negative with the exception of those mentioned in the HPI and as above.  Objective: Vital signs in last 24 hours: Temp:  [97.5 F (36.4 C)] 97.5 F (36.4 C) (11/10 0815) Pulse Rate:  [71] 71 (11/10 0815) Resp:  [20] 20  (11/10 0815) BP: (153)/(67) 153/67 (11/10 0815) SpO2:  [100 %] 100 % (11/10 0815) Weight:  [57.6 kg] 57.6 kg (11/10 0815) Estimated body mass index is 22.5 kg/m as calculated from the following:   Height as of this encounter: 5\' 3"  (1.6 m).   Weight as of this encounter: 57.6 kg.   General Appearance: Alert Head: Normocephalic, without obvious abnormality, atraumatic Eyes: PERRL, conjunctiva/corneas clear, EOM's intact,    Ears: Normal  Throat: Normal  Neck: Supple, Back: unremarkable Lungs: Clear to auscultation bilaterally, respirations unlabored Heart: Regular rate and rhythm, no murmur, rub or gallop Abdomen: Soft, non-tender Extremities: Extremities normal, atraumatic, no cyanosis or edema Skin: unremarkable  NEUROLOGIC:   Mental status: alert and oriented,Motor Exam - grossly normal Sensory Exam - grossly normal Reflexes:  Coordination - grossly normal Gait - grossly normal Balance - grossly normal Cranial Nerves:  I: smell Not tested  II: visual acuity  OS: Normal  OD: Normal   II: visual fields Full to confrontation  II: pupils Equal, round, reactive to light  III,VII: ptosis None  III,IV,VI: extraocular muscles  Full ROM  V: mastication Normal  V: facial light touch sensation  Normal  V,VII: corneal reflex  Present  VII: facial muscle function - upper  Normal  VII: facial muscle function - lower Normal  VIII: hearing Not tested  IX: soft palate elevation  Normal  IX,X: gag reflex Present  XI: trapezius strength  5/5  XI: sternocleidomastoid strength 5/5  XI: neck flexion strength  5/5  XII: tongue strength  Normal    Data Review Lab Results  Component Value Date   WBC 6.5 04/05/2021   HGB 13.5 04/05/2021   HCT 41.0 04/05/2021   MCV 96.5 04/05/2021   PLT 229 04/05/2021   Lab Results  Component Value Date   NA 137 04/05/2021   K 3.9 04/05/2021   CL 102 04/05/2021   CO2 28 04/05/2021   BUN 20 04/05/2021   CREATININE 0.79 04/05/2021   GLUCOSE  97 04/05/2021   Lab Results  Component Value Date   INR 1.0 07/12/2013    Assessment/Plan: T12 and L1 compression fracture, lumbago: I have discussed the situation with the patient.  I reviewed her imaging studies with her and pointed out the abnormalities.  We have discussed the various treatment options including a T12 and L1 kyphoplasty.  I have described that surgery to her.  I have shown her surgical models.  I have given her a surgical pamphlet.  We have discussed the risk, benefits, alternatives, expected postoperative course, and likelihood of achieving our goals with surgery.  I have answered all her questions.  She has decided proceed with surgery.   Ophelia Charter 04/08/2021 10:02 AM

## 2021-04-08 NOTE — Discharge Summary (Signed)
Physician Discharge Summary     Providing Compassionate, Quality Care - Together   Patient ID: Jacqueline Powers MRN: [id number] DOB/AGE: [id number] 84 y.o.  Admit date: 04/08/2021 Discharge date: 04/08/2021  Admission Diagnoses: Lumbar compression fracture, with delayed healing  Discharge Diagnoses:  Active Problems:   Lumbar compression fracture, with delayed healing, subsequent encounter   Discharged Condition: good  Hospital Course: Patient underwent a  T12 and L1 kyphoplasty by Dr.  Arnoldo Morale on  04/08/2021.  She was admitted to 3C08 following recovery from anesthesia in the PACU.  her postoperative course has been uncomplicated.  she has worked with both physical and occupational therapies who feel the patient is ready for discharge home.  she is ambulating independently and without difficulty.  she is tolerating a normal diet.  she is not having any bowel or bladder dysfunction.  her pain is well-controlled with oral pain medication.  she is ready for discharge home.   Consults:  PT, OT  Significant Diagnostic Studies: radiology: DG THORACOLUMABAR SPINE  Result Date: 04/08/2021 CLINICAL DATA:  Fluoroscopic assistance for kyphoplasty. EXAM: THORACOLUMBAR SPINE COMPARISON:  Radiographs done on 03/16/2021 FINDINGS: Fluoroscopic assistance was provided for kyphoplasty. Fluoroscopic time was 118 seconds. Radiation dose is 17.5 mGy. Single fluoroscopic image was obtained. IMPRESSION: Fluoroscopic assistance was provided for kyphoplasty. Electronically Signed   By: Elmer Picker M.D.   On: 04/08/2021 12:29   DG C-Arm 1-60 Min-No Report  Result Date: 04/08/2021 Fluoroscopy was utilized by the requesting physician.  No radiographic interpretation.     Treatments: surgery:  T12 and L1 kyphoplasty  Discharge Exam: Blood pressure (!) 147/76, pulse 72, temperature 97.8 F (36.6 C), resp. rate 18, height 5\' 3"  (1.6 m), weight 57.6 kg, SpO2 94 %.  Per report: Alert and oriented  x 4 PERRLA CN II-XII grossly intact MAE, Strength and sensation intact Incisions are covered with Honeycomb dressing and Steri Strips; Dressing is clean, dry, and intact   Disposition: Discharge disposition: 01-Home or Self Care        Allergies as of 04/08/2021       Reactions   Amoxicillin-pot Clavulanate Nausea And Vomiting   Has patient had a PCN reaction causing immediate rash, facial/tongue/throat swelling, SOB or lightheadedness with hypotension: No Has patient had a PCN reaction causing severe rash involving mucus membranes or skin necrosis: No Has patient had a PCN reaction that required hospitalization: No Has patient had a PCN reaction occurring within the last 10 years: Yes  If all of the above answers are "NO", then may proceed with Cephalosporin use.   Codeine Nausea And Vomiting   Septra [sulfamethoxazole-trimethoprim] Other (See Comments)   Reaction: unknown   Simvastatin Other (See Comments)   "did something to muscles"   Sulfa Antibiotics Other (See Comments)   Reaction: unknown   Tramadol Itching, Rash        Medication List     TAKE these medications    allopurinol 100 MG tablet Commonly known as: ZYLOPRIM Take 100 mg by mouth daily.   azelastine 0.1 % nasal spray Commonly known as: ASTELIN Place 1 spray into both nostrils 2 (two) times daily as needed for allergies. Use in each nostril as directed   diltiazem 30 MG tablet Commonly known as: CARDIZEM Take 1 tablet (30 mg total) by mouth 3 (three) times daily as needed (As needed for fast heart rates.).   docusate sodium 100 MG capsule Commonly known as: COLACE Take 1 capsule (100 mg total) by mouth 2 (two)  times daily.   dorzolamide-timolol 22.3-6.8 MG/ML ophthalmic solution Commonly known as: COSOPT Place 1 drop into both eyes 2 (two) times daily.   ezetimibe 10 MG tablet Commonly known as: ZETIA Take 1 tablet (10 mg total) by mouth daily. What changed: when to take this    fluticasone 50 MCG/ACT nasal spray Commonly known as: FLONASE Place 2 sprays into both nostrils at bedtime as needed for allergies.   levothyroxine 25 MCG tablet Commonly known as: SYNTHROID Take 25 mcg by mouth daily before breakfast.   Lumigan 0.01 % Soln Generic drug: bimatoprost Place 1 drop into the left eye at bedtime.   metoprolol succinate 25 MG 24 hr tablet Commonly known as: TOPROL-XL Take 1 tablet (25 mg total) by mouth daily.   omeprazole 40 MG capsule Commonly known as: PRILOSEC Take 40 mg by mouth daily as needed (acid reflux).   ondansetron 4 MG tablet Commonly known as: ZOFRAN Take 1 tablet (4 mg total) by mouth every 6 (six) hours as needed for nausea or vomiting.   oxyCODONE-acetaminophen 5-325 MG tablet Commonly known as: Percocet Take 1-2 tablets by mouth every 4 (four) hours as needed for severe pain.   Rivaroxaban 15 MG Tabs tablet Commonly known as: Xarelto TAKE 1 TABLET BY MOUTH EVERY DAY WITH SUPPER  **Restart on 04/11/2021** What changed: See the new instructions.   rosuvastatin 5 MG tablet Commonly known as: CRESTOR Take 1 tablet (5 mg total) by mouth daily. TAKE 1 TABLET DAILY AT 6 P.M. What changed:  when to take this additional instructions   Vitamin D 50 MCG (2000 UT) Caps Take 4,000 Units by mouth daily.   Vyzulta 0.024 % Soln Generic drug: Latanoprostene Bunod Place 1 drop into the right eye at bedtime.               Durable Medical Equipment  (From admission, onward)           Start     Ordered   04/08/21 1520  For home use only DME 3 n 1  Once        04/08/21 1520             Signed: Val Eagle, DNP, AGNP-C Nurse Practitioner  [facility] [address], [place], [place] [address] P: [phone]    F: [phone]  04/08/2021, 5:06 PM

## 2021-04-08 NOTE — Discharge Instructions (Addendum)
**    Restart Xarelto 04/11/2021**  Wound Care Keep incision covered and dry for two days.    Do not put any creams, lotions, or ointments on incision. Leave steri-strips on back.  They will fall off by themselves.  Activity Walk each and every day, increasing distance each day. No lifting greater than 5 lbs.  Avoid excessive back motion. No driving for 2 weeks; may ride as a passenger locally.  Diet Resume your normal diet.   Return to Work Will be discussed at your follow up appointment.  Call Your Doctor If Any of These Occur Redness, drainage, or swelling at the wound.  Temperature greater than 101 degrees. Severe pain not relieved by pain medication. Incision starts to come apart.  Follow Up Appt Call [phone] for any problems.

## 2021-04-08 NOTE — Plan of Care (Signed)

## 2021-04-08 NOTE — Anesthesia Postprocedure Evaluation (Signed)
Anesthesia Post Note  Patient: Jacqueline Powers  Procedure(s) Performed: KYPHOPLASTY T12, L1     Patient location during evaluation: PACU Anesthesia Type: General Level of consciousness: sedated Pain management: pain level controlled Vital Signs Assessment: post-procedure vital signs reviewed and stable Respiratory status: spontaneous breathing and respiratory function stable Cardiovascular status: stable Postop Assessment: no apparent nausea or vomiting Anesthetic complications: no   No notable events documented.  Last Vitals:  Vitals:   04/08/21 1222 04/08/21 1256  BP:  (!) 156/79  Pulse: 67 60  Resp: 13 20  Temp:    SpO2: (!) 87% 100%    Last Pain:  Vitals:   04/08/21 1256  TempSrc:   PainSc: 3                  Jacqueline Powers DANIEL

## 2021-04-08 NOTE — Op Note (Signed)
Brief history: The patient is an 86 year old white female who had the immediate onset of back pain while riding a horse.  She was worked up with lumbar x-rays and lumbar MRI which demonstrated a T12 and L1 compression fracture.  She failed medical management.  We discussed a kyphoplasty.  She has decided proceed with that surgery.  Preop diagnosis: T12 and L1 compression fracture, thoracic spine pain, lumbago  Postop diagnosis: The same  Procedure: T12 and L1 kyphoplasty  Surgeon: Dr. Delma Officer  Assistant: None  Anesthesia: General tracheal  Estimated blood loss: Minimal  Specimens: None  Drains: None  Complications: None  Description of procedure: The patient was brought to the operating room by the anesthesia team.  General endotracheal anesthesia was induced.  She was turned to the prone position on the chest rolls.  Her thoracolumbar region was then prepared with Betadine scrub and Betadine solution.  Sterile drapes were applied.  I then injected the area to be incised with Marcaine with epinephrine solution.  We used biplanar fluoroscopy for localization.  I made small incisions over the bilateral T12 and L1 pedicles.  Under biplanar fluoroscopy I cannulated the bilateral T12 and L1 pedicles with a Jamshidi needle.  I then removed the trochars and drilled the vertebral body.  I remove the drill and then inserted balloons.  I inflated then deflated balloons.  I then removed the balloons.  I then filled and the cavities with bone cement under biplanar fluoroscopy.  We got good fields bilaterally at T12 and L1.  I then remove the Jamshidi needles.  We then reapproximated the patient's subcutaneous tissue with interrupted 3-0 Vicryl suture.  We reapproximated the skin with Steri-Strips and benzoin.  The wound was then probed with bacitracin ointment.  A sterile dressing was applied.  The drapes were removed.  By report all sponge, instrument, and needle counts were correct at the end this  case.

## 2021-04-08 NOTE — Evaluation (Signed)
Occupational Therapy Evaluation Patient Details Name: Jacqueline Powers MRN: [id number] DOB: Nov 16, 1934 Today's Date: 04/08/2021   History of Present Illness 86 yo female s/p T12-L1 kyphoplasty on 11/10 after fall in June while riding a horse resulting in compression fx. PMH including arthritis, depression, PAF, TIA, and back sx.   Clinical Impression   PTA, pt was living alone and was independent. Currently, pt performing ADLs and functional mobility at Mod I level with increased time. Provided education and handout on back precautions, bed mobility, grooming, LB ADLs, toileting, and tub and shower transfer with 3n1; pt demonstrated understanding. Answered all pt questions. Pt with nausea at end of session; notified RN. Recommend dc home once medically stable per physician. All acute OT needs met and will sign off. Thank you.      Recommendations for follow up therapy are one component of a multi-disciplinary discharge planning process, led by the attending physician.  Recommendations may be updated based on patient status, additional functional criteria and insurance authorization.   Follow Up Recommendations  No OT follow up    Assistance Recommended at Discharge Intermittent Supervision/Assistance  Functional Status Assessment  Patient has had a recent decline in their functional status and demonstrates the ability to make significant improvements in function in a reasonable and predictable amount of time.  Equipment Recommendations  BSC/3in1    Recommendations for Other Services       Precautions / Restrictions Precautions Precautions: Back Precaution Booklet Issued: Yes (comment) Precaution Comments: Reviewed back precautions Required Braces or Orthoses: Other Brace Other Brace: No brace Restrictions Weight Bearing Restrictions: No      Mobility Bed Mobility Overal bed mobility: Modified Independent             General bed mobility comments: reviewing log roll     Transfers Overall transfer level: Needs assistance Equipment used: None Transfers: Sit to/from Stand Sit to Stand: Supervision           General transfer comment: Supervision for safety      Balance Overall balance assessment: Mild deficits observed, not formally tested                                         ADL either performed or assessed with clinical judgement   ADL Overall ADL's : Modified independent                                       General ADL Comments: Pt performing functional mobility and ADLs at Mod I level with increased time. Providing education on back prceautions, bed mobility, grooming, LB ADLs, toileting, and tub transfer.     Vision         Perception     Praxis      Pertinent Vitals/Pain Pain Assessment: Faces Faces Pain Scale: Hurts a little bit Pain Location: back Pain Descriptors / Indicators: Discomfort Pain Intervention(s): Monitored during session;Repositioned     Hand Dominance     Extremity/Trunk Assessment Upper Extremity Assessment Upper Extremity Assessment: Overall WFL for tasks assessed   Lower Extremity Assessment Lower Extremity Assessment: Defer to PT evaluation   Cervical / Trunk Assessment Cervical / Trunk Assessment: Back Surgery   Communication Communication Communication: No difficulties   Cognition Arousal/Alertness: Awake/alert Behavior During Therapy: WFL for tasks assessed/performed Overall Cognitive Status:  Within Functional Limits for tasks assessed                                       General Comments  Daughter present throughout    Exercises     Shoulder Instructions      Home Living Family/patient expects to be discharged to:: Private residence Living Arrangements: Alone Available Help at Discharge: Family Type of Home: House ([facility])       Home Layout: Two level Alternate Level Stairs-Number of Steps: Flight   Bathroom  Shower/Tub: Tub/shower unit;Walk-in shower   Bathroom Toilet: Standard     Home Equipment: None          Prior Functioning/Environment Prior Level of Function : Independent/Modified Independent               ADLs Comments: Works in Engineer, structural Problem List: Decreased activity tolerance;Decreased knowledge of precautions;Decreased knowledge of use of DME or AE;Pain      OT Treatment/Interventions:      OT Goals(Current goals can be found in the care plan section) Acute Rehab OT Goals Patient Stated Goal: Go home OT Goal Formulation: All assessment and education complete, DC therapy  OT Frequency:     Barriers to D/C:            Co-evaluation              AM-PAC OT "6 Clicks" Daily Activity     Outcome Measure Help from another person eating meals?: None Help from another person taking care of personal grooming?: None Help from another person toileting, which includes using toliet, bedpan, or urinal?: None Help from another person bathing (including washing, rinsing, drying)?: None Help from another person to put on and taking off regular upper body clothing?: None Help from another person to put on and taking off regular lower body clothing?: None 6 Click Score: 24   End of Session Nurse Communication: Mobility status;Other (comment) (Nausea)  Activity Tolerance: Patient tolerated treatment well Patient left: in bed;with call bell/phone within reach  OT Visit Diagnosis: Unsteadiness on feet (R26.81);Other abnormalities of gait and mobility (R26.89);Muscle weakness (generalized) (M62.81)                Time: 1443-1540 OT Time Calculation (min): 22 min Charges:  OT General Charges $OT Visit: 1 Visit OT Evaluation $OT Eval Low Complexity: [place], OTR/L Acute Rehab Pager: [phone] Office: [place] 04/08/2021, 3:31 PM

## 2021-04-08 NOTE — Anesthesia Preprocedure Evaluation (Addendum)
Anesthesia Evaluation  Patient identified by MRN, date of birth, ID band Patient awake    Reviewed: Allergy & Precautions, NPO status , Patient's Chart, lab work & pertinent test results  History of Anesthesia Complications Negative for: history of anesthetic complications  Airway Mallampati: II  TM Distance: >3 FB Neck ROM: Full    Dental no notable dental hx. (+) Dental Advisory Given   Pulmonary asthma , neg sleep apnea, neg COPD,    Pulmonary exam normal - rhonchi (-) wheezing      Cardiovascular Exercise Tolerance: Good + dysrhythmias Atrial Fibrillation  Rhythm:Regular Rate:Normal - Systolic murmurs and - Diastolic murmurs Preoperative cardiology input outlined on 03/29/2021 by Judy Pimple, PA-C, "Patient was contacted10/31/2022in reference to pre-operative risk assessment for pending surgery as outlined below. Aaryanna Hyden Guitewas last seen on 7/18/2022by Dr. Mariah Milling. Since that day, BYANKA LANDRUS done fine from a cardiac standpoint. Her activity has been somewhat limited since her back injury early July 2022 though she can still complete 4 METS without anginal complaints.  Therefore, based on ACC/AHA guidelines, the patient would be at acceptable risk for the planned procedure without further cardiovascular testing.Marland KitchenMarland KitchenPer pharmacy and Dr. Windell Hummingbird recommendations, patient can hold Xarelto 3 days prior to her upcoming kyphoplasty with plans to restart as soon as she is cleared to do so by her neurosurgeon." Per PAT RN documentation, last Xarelto 04/03/21.     Neuro/Psych TIA Neuromuscular disease negative psych ROS   GI/Hepatic Neg liver ROS, hiatal hernia, GERD  ,  Endo/Other  Hypothyroidism   Renal/GU Renal disease     Musculoskeletal  (+) Arthritis ,   Abdominal (+) - obese,   Peds  Hematology negative hematology ROS (+)   Anesthesia Other Findings    Reproductive/Obstetrics                             Anesthesia Physical  Anesthesia Plan  ASA: 3  Anesthesia Plan: General   Post-op Pain Management:    Induction: Intravenous  PONV Risk Score and Plan: 3 and Ondansetron and Dexamethasone  Airway Management Planned: Oral ETT  Additional Equipment:   Intra-op Plan:   Post-operative Plan: Extubation in OR  Informed Consent: I have reviewed the patients History and Physical, chart, labs and discussed the procedure including the risks, benefits and alternatives for the proposed anesthesia with the patient or authorized representative who has indicated his/her understanding and acceptance.     Dental advisory given  Plan Discussed with: Anesthesiologist and CRNA  Anesthesia Plan Comments:        Anesthesia Quick Evaluation

## 2021-04-08 NOTE — Transfer of Care (Signed)
Immediate Anesthesia Transfer of Care Note  Patient: Jacqueline Powers  Procedure(s) Performed: KYPHOPLASTY T12, L1  Patient Location: PACU  Anesthesia Type:General  Level of Consciousness: patient cooperative and responds to stimulation  Airway & Oxygen Therapy: Patient Spontanous Breathing and Patient connected to nasal cannula oxygen  Post-op Assessment: Report given to RN and Post -op Vital signs reviewed and stable  Post vital signs: Reviewed and stable  Last Vitals:  Vitals Value Taken Time  BP 144/66 04/08/21 1156  Temp    Pulse 68 04/08/21 1156  Resp 25 04/08/21 1156  SpO2 100 % 04/08/21 1156  Vitals shown include unvalidated device data.  Last Pain:  Vitals:   04/08/21 0819  TempSrc:   PainSc: 0-No pain      Patients Stated Pain Goal: 0 (04/08/21 0819)  Complications: No notable events documented.

## 2021-04-08 NOTE — Anesthesia Procedure Notes (Signed)
Procedure Name: Intubation Date/Time: 04/08/2021 10:26 AM Performed by: Betha Loa, CRNA Pre-anesthesia Checklist: Patient identified, Emergency Drugs available, Suction available and Patient being monitored Patient Re-evaluated:Patient Re-evaluated prior to induction Oxygen Delivery Method: Circle System Utilized Preoxygenation: Pre-oxygenation with 100% oxygen Induction Type: IV induction Ventilation: Mask ventilation without difficulty Laryngoscope Size: Mac and 3 Tube type: Oral Tube size: 7.0 mm Number of attempts: 1 Airway Equipment and Method: Stylet and Oral airway Placement Confirmation: ETT inserted through vocal cords under direct vision, positive ETCO2 and breath sounds checked- equal and bilateral Secured at: 21 cm Tube secured with: Tape Dental Injury: Teeth and Oropharynx as per pre-operative assessment

## 2021-04-08 NOTE — Progress Notes (Signed)
Patient discharged to home  via wheelchair accompanied  by daughter. Patient in no s/s of acute distress at the time of discharge. Discharge instruction given and verbalized understanding.

## 2021-04-08 NOTE — Evaluation (Signed)
Physical Therapy Evaluation Patient Details Name: Jacqueline Powers MRN: [id number] DOB: 31-May-1934 Today's Date: 04/08/2021  History of Present Illness  Pt is an 86 yo female s/p T12-L1 kyphoplasty on 11/10 after fall in June while riding a horse resulting in compression fx. PMH including arthritis, depression, PAF, TIA, and back sx.   Clinical Impression  Pt admitted with above diagnosis. At the time of PT eval, pt was able to demonstrate transfers and ambulation with gross min guard assist to supervision for safety and no AD. Pt was educated on precautions, positioning recommendations, appropriate activity progression, and car transfer. Pt currently with functional limitations due to the deficits listed below (see PT Problem List). Pt will benefit from skilled PT to increase their independence and safety with mobility to allow discharge to the venue listed below.         Recommendations for follow up therapy are one component of a multi-disciplinary discharge planning process, led by the attending physician.  Recommendations may be updated based on patient status, additional functional criteria and insurance authorization.  Follow Up Recommendations No PT follow up    Assistance Recommended at Discharge PRN  Functional Status Assessment Patient has had a recent decline in their functional status and demonstrates the ability to make significant improvements in function in a reasonable and predictable amount of time.  Equipment Recommendations  None recommended by PT    Recommendations for Other Services       Precautions / Restrictions Precautions Precautions: Back Precaution Booklet Issued: Yes (comment) Precaution Comments: Reviewed back precautions Required Braces or Orthoses: Other Brace Other Brace: No brace Restrictions Weight Bearing Restrictions: No      Mobility  Bed Mobility Overal bed mobility: Modified Independent             General bed mobility comments: HOB  flat and rails lowered to simulate home environment.    Transfers Overall transfer level: Needs assistance Equipment used: None Transfers: Sit to/from Stand Sit to Stand: Supervision           General transfer comment: Mild unsteadiness noted with power-up to full stand from EOB and toilet    Ambulation/Gait Ambulation/Gait assistance: Supervision;Min guard Gait Distance (Feet): 300 Feet Assistive device: None Gait Pattern/deviations: Step-through pattern;Decreased stride length;Drifts right/left;Narrow base of support Gait velocity: Decreased Gait velocity interpretation: 1.31 - 2.62 ft/sec, indicative of limited community ambulator   General Gait Details: Drifting with mild unsteadiness throughout gait training. Min guard assist provided to recover.  Stairs Stairs: Yes Stairs assistance: Min guard Stair Management: One rail Right;Alternating pattern;Forwards Number of Stairs: 10 General stair comments: VC's for sequencing and general safety.  Wheelchair Mobility    Modified Rankin (Stroke Patients Only)       Balance Overall balance assessment: Mild deficits observed, not formally tested                                           Pertinent Vitals/Pain Pain Assessment: Faces Faces Pain Scale: Hurts a little bit Pain Location: back Pain Descriptors / Indicators: Discomfort Pain Intervention(s): Monitored during session    Home Living Family/patient expects to be discharged to:: Private residence Living Arrangements: Alone Available Help at Discharge: Family Type of Home: House ([facility])       Alternate Level Stairs-Number of Steps: Flight Home Layout: Two level Home Equipment: None      Prior Function Prior  Level of Function : Independent/Modified Independent;Working/employed;Driving             Mobility Comments: Independent without an AD - dri 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         Extremity/Trunk Assessment   Upper Extremity Assessment Upper Extremity Assessment: Defer to OT evaluation    Lower Extremity Assessment Lower Extremity Assessment: Overall WFL for tasks assessed (However, pt reports her legs feel like "jello")    Cervical / Trunk Assessment Cervical / Trunk Assessment: Back Surgery  Communication   Communication: No difficulties  Cognition Arousal/Alertness: Awake/alert Behavior During Therapy: WFL for tasks assessed/performed Overall Cognitive Status: Within Functional Limits for tasks assessed                                          General Comments General comments (skin integrity, edema, etc.): Daughter present throughout    Exercises     Assessment/Plan    PT Assessment Patient needs continued PT services  PT Problem List Decreased strength;Decreased activity tolerance;Decreased mobility;Decreased balance;Decreased knowledge of use of DME;Decreased safety awareness;Decreased knowledge of precautions;Pain       PT Treatment Interventions DME instruction;Gait training;Stair training;Functional mobility training;Therapeutic activities;Therapeutic exercise;Neuromuscular re-education;Patient/family education    PT Goals (Current goals can be found in the Care Plan section)  Acute Rehab PT Goals Patient Stated Goal: Be independent at home PT Goal Formulation: With patient/family Time For Goal Achievement: 04/15/21 Potential to Achieve Goals: Good    Frequency Min 5X/week   Barriers to discharge        Co-evaluation               AM-PAC PT "6 Clicks" Mobility  Outcome Measure Help needed turning from your back to your side while in a flat bed without using bedrails?: None Help needed moving from lying on your back to sitting on the side of a flat bed without using bedrails?: None Help needed moving to and from a bed to a chair (including a wheelchair)?: A Little Help needed standing up from a chair using  your arms (e.g., wheelchair or bedside chair)?: A Little Help needed to walk in hospital room?: A Little Help needed climbing 3-5 steps with a railing? : A Little 6 Click Score: 20    End of Session Equipment Utilized During Treatment: Gait belt Activity Tolerance: Patient tolerated treatment well Patient left: Other (comment) (Sitting EOB with OT and daughter present)   PT Visit Diagnosis: Unsteadiness on feet (R26.81);Pain Pain - part of body:  (back)    Time: 1435-1500 PT Time Calculation (min) (ACUTE ONLY): 25 min   Charges:   PT Evaluation $PT Eval Low Complexity: 1 Low PT Treatments $Gait Training: 8-22 mins        Conni Slipper, PT, DPT Acute Rehabilitation Services Pager: [phone] Office: [phone]   Marylynn Pearson 04/08/2021, 3:53 PM

## 2021-04-09 ENCOUNTER — Encounter (HOSPITAL_COMMUNITY): Payer: Self-pay | Admitting: Neurosurgery

## 2021-05-06 ENCOUNTER — Other Ambulatory Visit: Payer: Self-pay | Admitting: Internal Medicine

## 2021-05-06 DIAGNOSIS — R1032 Left lower quadrant pain: Secondary | ICD-10-CM

## 2021-05-10 ENCOUNTER — Ambulatory Visit
Admission: RE | Admit: 2021-05-10 | Discharge: 2021-05-10 | Disposition: A | Discharge status: Home / Self Care | Payer: Medicare Other | Source: Ambulatory Visit | Attending: Internal Medicine | Admitting: Internal Medicine

## 2021-05-10 ENCOUNTER — Other Ambulatory Visit: Payer: Self-pay

## 2021-05-10 DIAGNOSIS — R1032 Left lower quadrant pain: Secondary | ICD-10-CM | POA: Insufficient documentation

## 2021-05-10 MED ORDER — IOHEXOL 300 MG/ML  SOLN
100.0000 mL | Freq: Once | INTRAMUSCULAR | Status: AC | PRN
  Administered 2021-05-10: 100 mL via INTRAVENOUS

## 2021-06-02 ENCOUNTER — Other Ambulatory Visit: Payer: Self-pay | Admitting: Internal Medicine

## 2021-06-02 DIAGNOSIS — R1011 Right upper quadrant pain: Secondary | ICD-10-CM

## 2021-06-04 ENCOUNTER — Ambulatory Visit
Admission: RE | Admit: 2021-06-04 | Discharge: 2021-06-04 | Disposition: A | Discharge status: Home / Self Care | Payer: Medicare Other | Source: Ambulatory Visit | Attending: Internal Medicine | Admitting: Internal Medicine

## 2021-06-04 DIAGNOSIS — R1011 Right upper quadrant pain: Secondary | ICD-10-CM | POA: Diagnosis not present

## 2021-07-22 ENCOUNTER — Telehealth: Payer: Self-pay | Admitting: Cardiovascular Disease

## 2021-07-22 ENCOUNTER — Encounter: Payer: Self-pay | Admitting: Cardiovascular Disease

## 2021-07-22 NOTE — Telephone Encounter (Signed)
Spoke with patient and she stated that she just took her BP again and it was 145/68. I advised her to keep taking her BP medicine, that 113/63 was a great number. For her headache, and general malaise, I advised that she follow up were her PCP. Patient verbalized understanding and agreed with plan.

## 2021-07-22 NOTE — Telephone Encounter (Signed)
Pt c/o BP issue: STAT if pt c/o blurred vision, one-sided weakness or slurred speech  Patient states she has had COVID and has stopped her BP medicine as of this morning, due to her readings being low  1. What are your last 5 BP readings?  This morning 113/63 HR    2. Are you having any other symptoms (ex. Dizziness, headache, blurred vision, passed out)? Headache are "driving me crazy", states she is "white and weak"  3. What is your BP issue? low

## 2021-08-26 ENCOUNTER — Other Ambulatory Visit: Payer: Self-pay | Admitting: Cardiovascular Disease

## 2021-08-26 NOTE — Telephone Encounter (Signed)
Prescription refill request for Xarelto received.  ?Indication: PAF ?Last office visit: 12/14/20  Concha Se MD ?Weight: 57.6kg ?Age: 87 ?Scr: 0.8 on 05/05/21 ?CrCl: 47.60 ? ?Based on above findings Xarelto 15mg  daily is the appropriate dose.  Refill approved. ? ?

## 2022-01-24 ENCOUNTER — Telehealth: Payer: Self-pay | Admitting: Cardiovascular Disease

## 2022-01-24 MED ORDER — METOPROLOL SUCCINATE ER 25 MG PO TB24: 25.0000 mg | ORAL_TABLET | Freq: Every day | ORAL | 0 refills | Status: DC

## 2022-01-24 NOTE — Telephone Encounter (Signed)
*  STAT* If patient is at the pharmacy, call can be transferred to refill team.   1. Which medications need to be refilled? (please list name of each medication and dose if known) metoprolol succinate (TOPROL-XL) 25 MG 24 hr tablet  2. Which pharmacy/location (including street and city if local pharmacy) is medication to be sent to? [facility] [address], [place] [facility]  3. Do they need a 30 day or 90 day supply? 2 week supply, 90 day  Patient only has 3 tablets left. She says she needs a 2 week supply sent to [facility] and a 90 day supply sent to [facility].

## 2022-01-24 NOTE — Telephone Encounter (Signed)
Ok to send in refill to cover her. Looks like Dr. Mariah Milling wanted her scheduled 12 months after last ov 11/2020 but follow up was not scheduled until December 2023. So does not look like she no showed or cancelled. So I think ok to send refill but will also forward to scheduling to move her 1 year follow up sooner - can be scheduled first available now with Dr. Mariah Milling or APP.

## 2022-01-24 NOTE — Telephone Encounter (Signed)
Requested Prescriptions   Signed Prescriptions Disp Refills   metoprolol succinate (TOPROL-XL) 25 MG 24 hr tablet 90 tablet 0    Sig: Take 1 tablet (25 mg total) by mouth daily.    Authorizing Provider: Antonieta Iba    Ordering User: Thayer Headings, Krystn Dermody L   2 week supply also sent to local pharmacy as requested.

## 2022-01-24 NOTE — Telephone Encounter (Signed)
Please advise if OK to refill or defer until seen in office. May also be able to see APP sooner. Scheduled appt for December 2023. Last seen 7/22 with 12 month F/U advised. Thank you!

## 2022-02-09 ENCOUNTER — Telehealth: Payer: Self-pay | Admitting: Cardiovascular Disease

## 2022-02-09 NOTE — Telephone Encounter (Signed)
Pt c/o BP issue: STAT if pt c/o blurred vision, one-sided weakness or slurred speech  1. What are your last 5 BP readings? 168/75; 156/72  2. Are you having any other symptoms (ex. Dizziness, headache, blurred vision, passed out)? Headache, dizziness, and weak legs  3. What is your BP issue?  Calling to see what she needs to do

## 2022-02-09 NOTE — Telephone Encounter (Signed)
Spoke with patient and she reports her blood pressures have been elevated. She was prescribed prednisone and antibiotic last week. She called PCP requesting to only take 1 tablet daily. He was agreeable with that decrease. She is calling today with blood pressures of 168/75, 156/72, and while on phone I had her check it and it was 136/63 HR was 67. Those elevated readings were after medications. She states her blood pressures normally run 122-125 with HR of 60. Patient states she took additional Toprol XL to see if that would help. Reviewed that I will forward this message over to her provider for review and recommendations. She verbalized understanding with no other concerns.

## 2022-02-14 ENCOUNTER — Other Ambulatory Visit: Payer: Self-pay | Admitting: Cardiovascular Disease

## 2022-02-14 NOTE — Telephone Encounter (Signed)
Refill Request.  

## 2022-02-14 NOTE — Telephone Encounter (Signed)
Prescription refill request for Xarelto received.  Indication: PAF Last office visit: 12/14/20  Johnny Bridge MD  (Has appt 04/29/22) Weight: 57.6kg Age: 87 Scr:  0.9 on 09/01/21 CrCl: 41.56  Based on above findings Xarelto 15mg  daily is the appropriate dose.  Refill approved.

## 2022-02-16 NOTE — Telephone Encounter (Signed)
Jacqueline Merritts, MD  [facility] Triage Yesterday (8:05 AM)    Could use the diltiazem 30 TID PRn that is on the med list  Would start the diltiazem BID to start, up to TID if needed  On ABX and prednisone for unclear reason, but this could be driving blood pressure  Probably needs f/u in clinic, has been over 1 year  Thx  TG     Pt aware of the above information.  She reports she decreased the amount of prednisone and completed the antibiotic and BP seems to be improving.  She will continue to monitor and contact the office if no improvement.  She will check appt for f/u as scheduled.

## 2022-02-23 IMAGING — CT CT ABD-PELV W/ CM
2 of 5 series · 16 of 46 positions shown, 18 images · IV contrast (omnipaque)
Comparison: 07/24/2018

CLINICAL DATA: Constipation and left lower quadrant abdominal pain.

EXAM:
CT ABDOMEN AND PELVIS WITH CONTRAST
TECHNIQUE: Multidetector CT imaging of the abdomen and pelvis was performed
using the standard protocol following bolus administration of
intravenous contrast.
CONTRAST:  100mL OMNIPAQUE IOHEXOL 300 MG/ML  SOLN

[Series 2: axial st · axial · 0.66mm/px · z∈[+311,+721]mm · 13 of 94 slices shown, 15 images]
[im 6/94  soft-tissue]
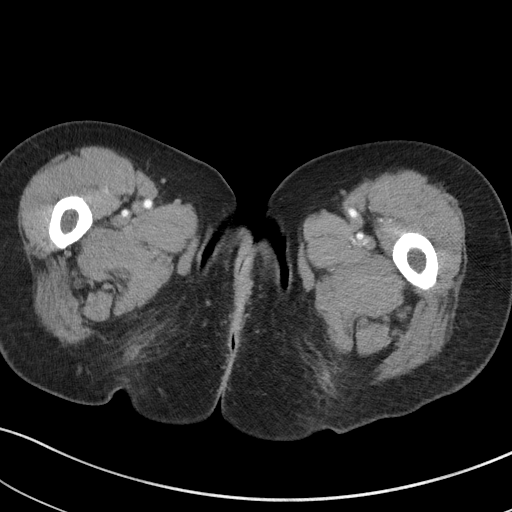
[im 6/94  bone]
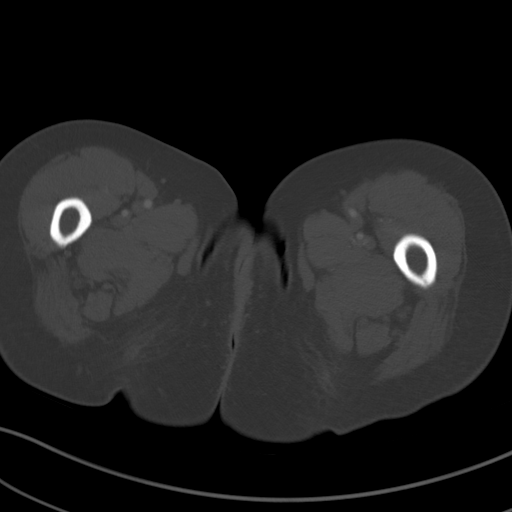
[im 11/94  soft-tissue]
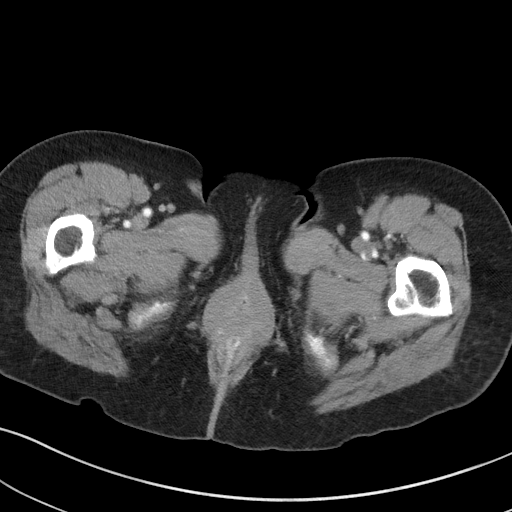
[im 21/94  soft-tissue]
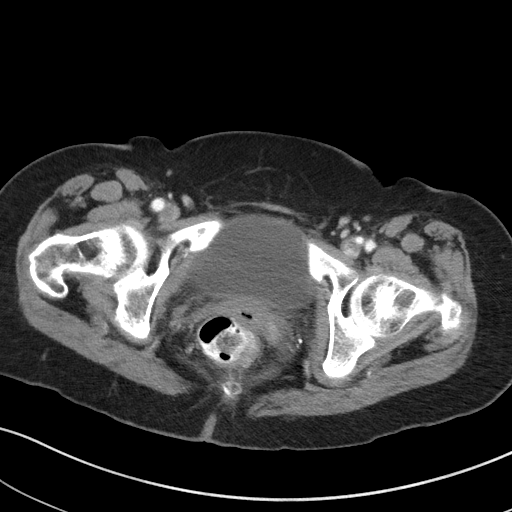
[im 26/94  soft-tissue]
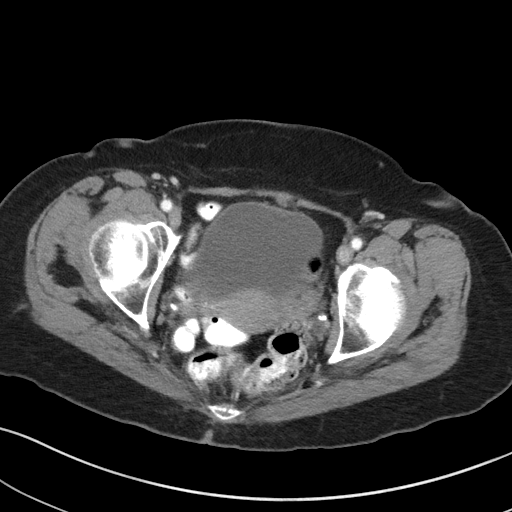
[im 32/94  soft-tissue]
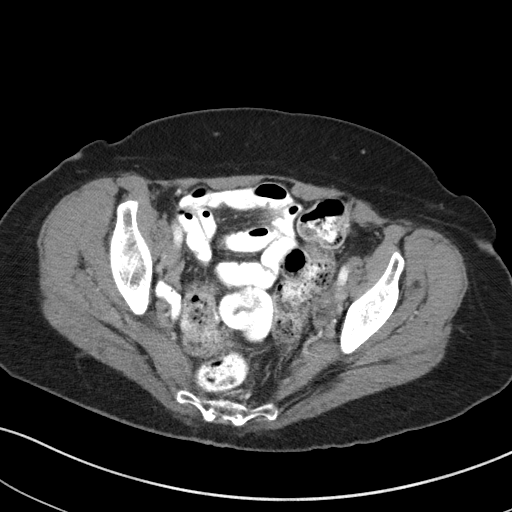
[im 42/94  soft-tissue]
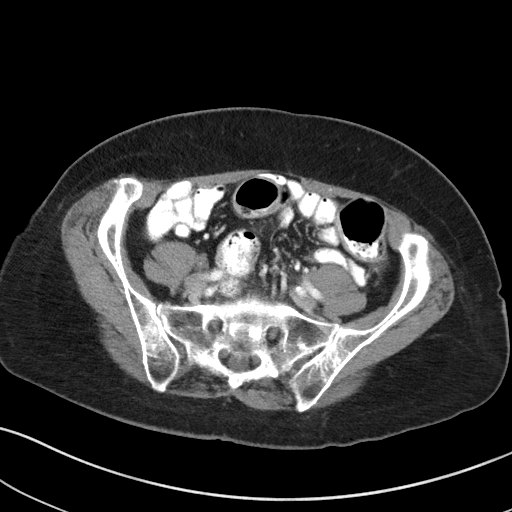
[im 47/94  soft-tissue]
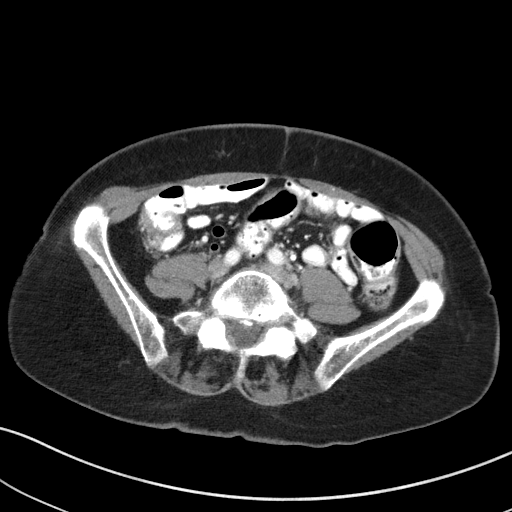
[im 52/94  soft-tissue]
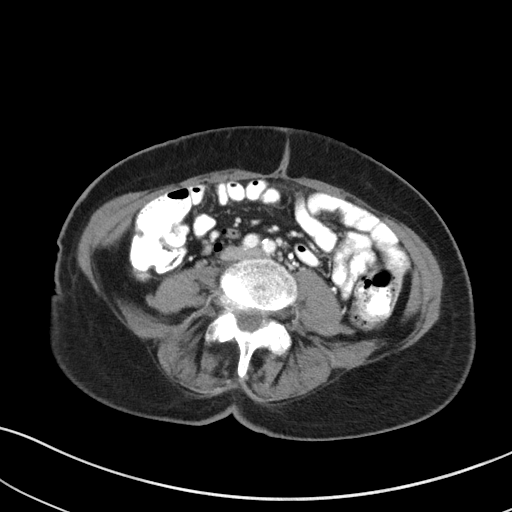
[im 63/94  soft-tissue]
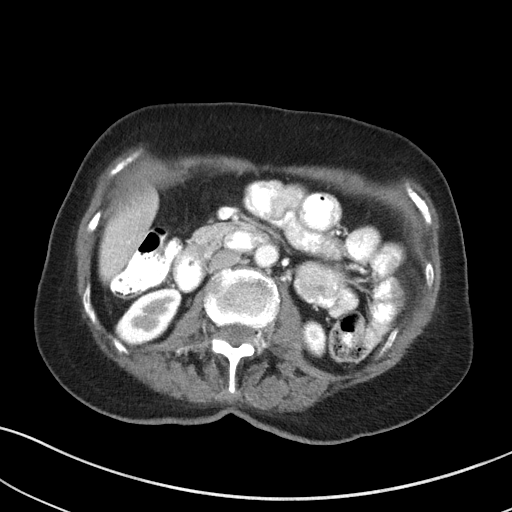
[im 63/94  bone]
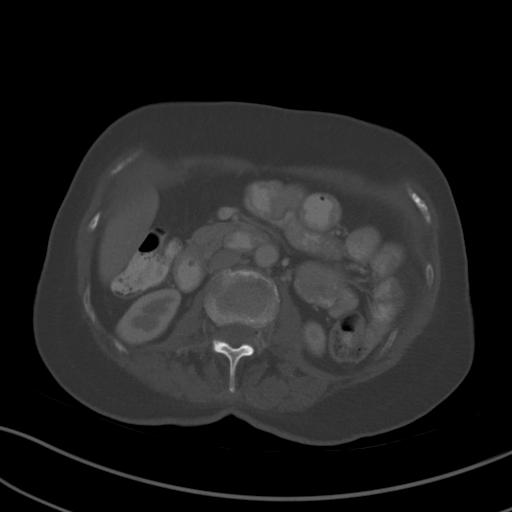
[im 68/94  soft-tissue]
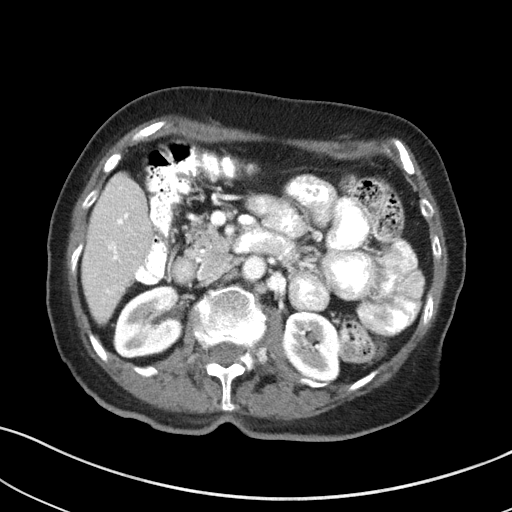
[im 73/94  soft-tissue]
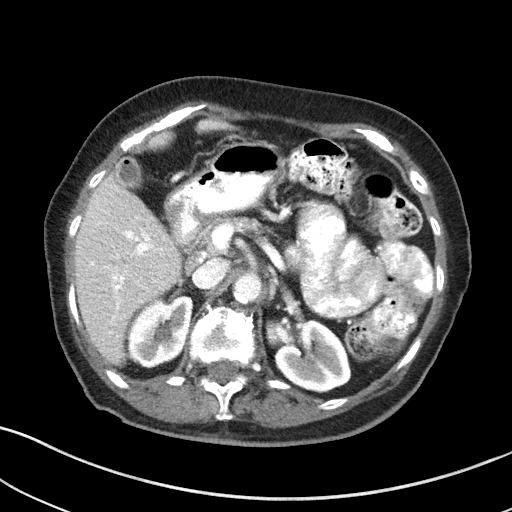
[im 83/94  soft-tissue]
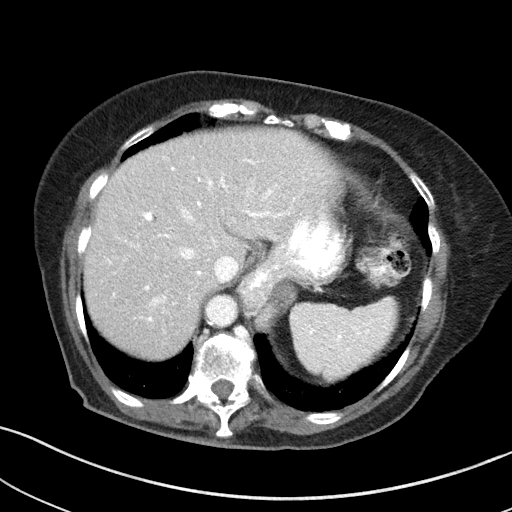
[im 88/94  soft-tissue]
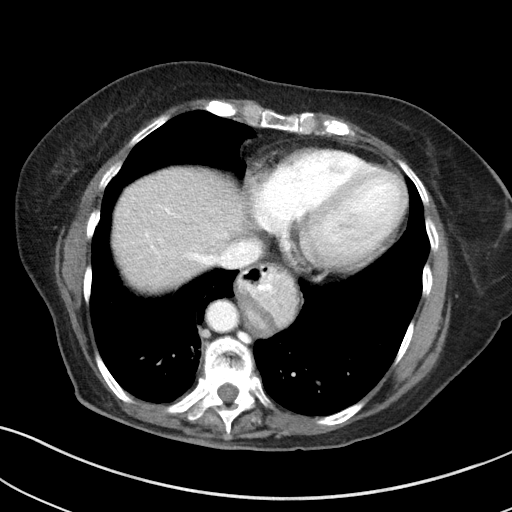

[Series 5: coronal st · coronal · 0.73mm/px · 3 of 85 slices shown]
[im 29/85  soft-tissue]
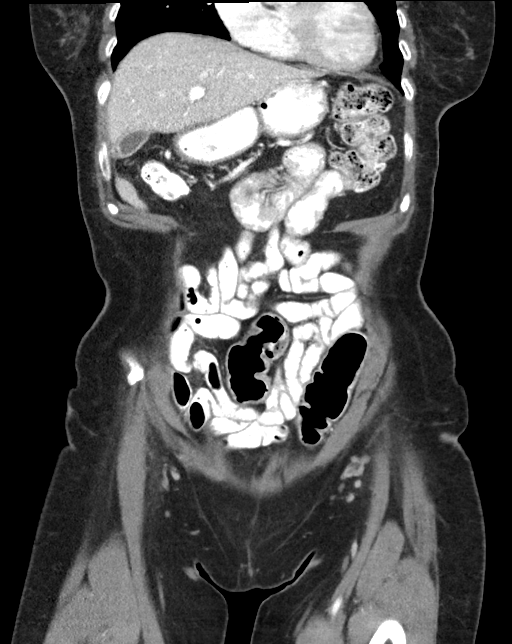
[im 38/85  soft-tissue]
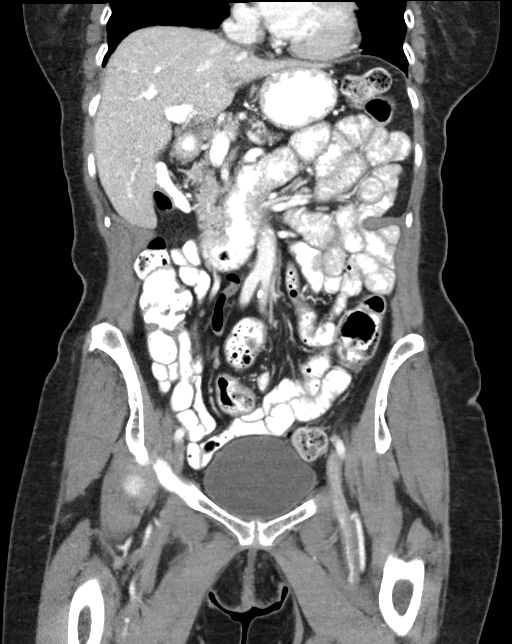
[im 47/85  soft-tissue]
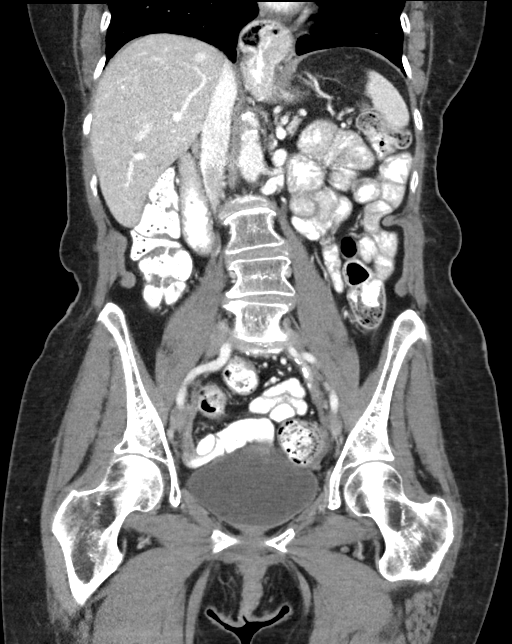

[16 of 46 positions shown; findings below may reference images not displayed]

FINDINGS: Lower chest: Mild scarring at the lung bases. Moderate size hiatal
hernia, larger than seen 2 years ago.

Hepatobiliary: Liver parenchyma is normal. Air or gas in the
gallbladder and cystic duct could relate to previous sphincterotomy.
If not, sphincter could be incompetent or these could represent
nitrogen gas in small gallstones. No sign of cholecystitis or
biliary obstruction.

Pancreas: Chronic atrophic change of pancreas.  No acute finding.

Spleen: Normal

Adrenals/Urinary Tract: Adrenal glands are normal. Kidneys are
normal. No cyst, mass, stone or hydronephrosis. Bladder is normal.

Stomach/Bowel: Hiatal hernia as noted above. The stomach otherwise
appears normal. No small bowel abnormality is seen. There is a
moderate but normal amount of stool within the colon. Orally
administered contrast has passed all the way to the rectum. No
evidence of diverticulosis or diverticulitis. No other bowel
finding.

Vascular/Lymphatic: Aortic atherosclerosis. No aneurysm. IVC is
normal. No adenopathy.

Reproductive: No pelvic mass.

Other: No free fluid or air.

Musculoskeletal: Scoliosis and degenerative change of the spine.
Previous vertebral augmentation at T12 and L1. Minor superior
endplate depression at T10, age indeterminate.
IMPRESSION: No abnormality seen to explain left lower quadrant abdominal pain.
There is a moderate but normal amount of stool within the colon.
Previously administered oral contrast has passed all the way to the
rectum.

Small amount of air/gas in the gallbladder and cystic duct, felt
probably secondary to previous sphincterotomy.

Moderate size hiatal hernia, enlarged since 2727.

Aortic Atherosclerosis ([id number]).

## 2022-03-17 ENCOUNTER — Ambulatory Visit (INDEPENDENT_AMBULATORY_CARE_PROVIDER_SITE_OTHER): Payer: Medicare Other | Admitting: Urology

## 2022-03-17 ENCOUNTER — Encounter: Payer: Self-pay | Admitting: Urology

## 2022-03-17 VITALS — BP 145/80 | HR 76 | Ht 63.0 in | Wt 124.0 lb

## 2022-03-17 DIAGNOSIS — N3946 Mixed incontinence: Secondary | ICD-10-CM | POA: Diagnosis not present

## 2022-03-17 DIAGNOSIS — N3941 Urge incontinence: Secondary | ICD-10-CM

## 2022-03-17 DIAGNOSIS — N393 Stress incontinence (female) (male): Secondary | ICD-10-CM

## 2022-03-17 DIAGNOSIS — R32 Unspecified urinary incontinence: Secondary | ICD-10-CM

## 2022-03-17 LAB — URINALYSIS, COMPLETE
Bilirubin, UA: NEGATIVE
Glucose, UA: NEGATIVE
Ketones, UA: NEGATIVE
Leukocytes,UA: NEGATIVE
Nitrite, UA: NEGATIVE
Protein,UA: NEGATIVE
RBC, UA: NEGATIVE
Specific Gravity, UA: 1.025 (ref 1.005–1.030)
Urobilinogen, Ur: 0.2 mg/dL (ref 0.2–1.0)
pH, UA: 5 (ref 5.0–7.5)

## 2022-03-17 LAB — MICROSCOPIC EXAMINATION

## 2022-03-17 LAB — BLADDER SCAN AMB NON-IMAGING

## 2022-03-17 NOTE — Progress Notes (Signed)
03/17/2022 4:30 PM   [place] February 02, 1935 [id number]  Referring provider: Baxter Hire, MD [place],  [place] [address]  Chief Complaint  Patient presents with   New Patient (Initial Visit)   Urinary Incontinence    HPI: 87 year old female who presents today as self-referral for further evaluation of incontinence.  She reports that over the past several years, she had worsening what sound like urge incontinence.  She will use the bathroom at night and before to get to the bathroom, will have larger volume of leakage.  She also reports that when she gets out of her car and starts walking, she will suddenly leak a fair amount of urine without feeling the urge first.  This seems to be getting worse.  She knows to wear a pad.  Is worried about going out because of this.  She also some mild leakage when she laughs coughs and sneezes which is chronic.  Is been going on for a long time.  This is less bothersome than the aforementioned.  She reports that she has a very remote history back when she was 72 of having "bladder polyps" which were fulgurated by Dr. Ernst Spell.  She wonders if this is happening again.  She took suppressive antibiotics for a year.  She denies any vaginal pressure or symptoms.  She does have a tendency for constipation and uses a suppository to help her go.  She does not feel the urge to have a bowel movement but, but will feel full in her rectum and end up having to take a suppository to help facilitate this.  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  She enjoys playing bridge with her friends.   PMH: Past Medical History:  Diagnosis Date   Arthritis    Asthma    Carpal tunnel syndrome    Colonic polyp    Complication of anesthesia    "took me a long to wake up"   Degenerative joint disease of cervical and lumbar spine    Dysrhythmia     GERD (gastroesophageal reflux disease)    Glaucoma    Hashimoto's thyroiditis    a. 05/2002 s/p resection of thyroid nodule-->on replacement.   Hiatal hernia    History of depression    Hyperlipidemia    Hypothyroidism    Irritable bowel syndrome    Osteoporosis    PAF (paroxysmal atrial fibrillation) (HCC)    a. CHA2DS2VASc = 5-->xarelto;     Perennial allergic rhinitis    Right knee meniscal tear    TIA (transient ischemic attack)    a. 06/2013: LUE/LLE wkns x 15 mins, MRI neg for CVA.    Surgical History: Past Surgical History:  Procedure Laterality Date   BACK SURGERY     L5   CATARACT EXTRACTION     CERVICAL CONE BIOPSY     CERVICAL FUSION     COLONOSCOPY     COLONOSCOPY WITH PROPOFOL N/A 01/20/2017   Procedure: COLONOSCOPY WITH PROPOFOL;  Surgeon: Manya Silvas, MD;  Location: [facility] ENDOSCOPY;  Service: Endoscopy;  Laterality: N/A;   COLONOSCOPY WITH PROPOFOL N/A 07/21/2020   Procedure: COLONOSCOPY WITH PROPOFOL;  Surgeon: Lesly Rubenstein, MD;  Location: [facility] ENDOSCOPY;  Service: Endoscopy;  Laterality: N/A;   ESOPHAGOGASTRODUODENOSCOPY N/A 07/21/2020   Procedure: ESOPHAGOGASTRODUODENOSCOPY (EGD);  Surgeon: Lesly Rubenstein, MD;  Location: [facility] ENDOSCOPY;  Service: Endoscopy;  Laterality: N/A;   ESOPHAGOGASTRODUODENOSCOPY (EGD) WITH PROPOFOL N/A 01/20/2017   Procedure: ESOPHAGOGASTRODUODENOSCOPY (EGD) WITH PROPOFOL;  Surgeon: Scot Jun, MD;  Location: [facility] ENDOSCOPY;  Service: Endoscopy;  Laterality: N/A;   ESOPHAGOGASTRODUODENOSCOPY (EGD) WITH PROPOFOL N/A 04/28/2017   Procedure: ESOPHAGOGASTRODUODENOSCOPY (EGD) WITH PROPOFOL;  Surgeon: Scot Jun, MD;  Location: [facility] ENDOSCOPY;  Service: Endoscopy;  Laterality: N/A;   KYPHOPLASTY N/A 04/08/2021   Procedure: KYPHOPLASTY T12, L1;  Surgeon: Tressie Stalker, MD;  Location: [facility] OR;  Service: Neurosurgery;  Laterality: N/A;   TONSILLECTOMY      Home Medications:  Allergies as of 03/17/2022        Reactions   Amoxicillin-pot Clavulanate Nausea And Vomiting   Has patient had a PCN reaction causing immediate rash, facial/tongue/throat swelling, SOB or lightheadedness with hypotension: No Has patient had a PCN reaction causing severe rash involving mucus membranes or skin necrosis: No Has patient had a PCN reaction that required hospitalization: No Has patient had a PCN reaction occurring within the last 10 years: Yes  If all of the above answers are "NO", then may proceed with Cephalosporin use.   Codeine Nausea And Vomiting   Septra [sulfamethoxazole-trimethoprim] Other (See Comments)   Reaction: unknown   Simvastatin Other (See Comments)   "did something to muscles"   Sulfa Antibiotics Other (See Comments)   Reaction: unknown   Tramadol Itching, Rash        Medication List        Accurate as of March 17, 2022  4:30 PM. If you have any questions, ask your nurse or doctor.          STOP taking these medications    docusate sodium 100 MG capsule Commonly known as: COLACE Stopped by: Vanna Scotland, MD   oxyCODONE-acetaminophen 5-325 MG tablet Commonly known as: Percocet Stopped by: Vanna Scotland, MD   rosuvastatin 5 MG tablet Commonly known as: CRESTOR Stopped by: Vanna Scotland, MD       TAKE these medications    allopurinol 100 MG tablet Commonly known as: ZYLOPRIM Take 100 mg by mouth daily.   azelastine 0.1 % nasal spray Commonly known as: ASTELIN Place 1 spray into both nostrils 2 (two) times daily as needed for allergies. Use in each nostril as directed   diltiazem 30 MG tablet Commonly known as: CARDIZEM Take 1 tablet (30 mg total) by mouth 3 (three) times daily as needed (As needed for fast heart rates.).   dorzolamide-timolol 2-0.5 % ophthalmic solution Commonly known as: COSOPT Place 1 drop into both eyes 2 (two) times daily.   ezetimibe 10 MG tablet Commonly known as: ZETIA Take 1 tablet (10 mg total) by mouth daily. What changed:  when to take this   fluticasone 50 MCG/ACT nasal spray Commonly known as: FLONASE Place 2 sprays into both nostrils at bedtime as needed for allergies.   levothyroxine 25 MCG tablet Commonly known as: SYNTHROID Take 25 mcg by mouth daily before breakfast.   Lumigan 0.01 % Soln Generic drug: bimatoprost Place 1 drop into the left eye at bedtime.   metoprolol succinate 25 MG 24 hr tablet Commonly known as: TOPROL-XL Take 1 tablet (25 mg total) by mouth daily.   omeprazole 40 MG capsule Commonly known as: PRILOSEC Take 40 mg by mouth daily as needed (acid reflux).   ondansetron 4 MG tablet Commonly known as: ZOFRAN Take 1 tablet (4 mg total) by mouth every 6 (six) hours as needed for nausea or vomiting.   Vitamin D  50 MCG (2000 UT) Caps Take 4,000 Units by mouth daily.   Vyzulta 0.024 % Soln Generic drug: Latanoprostene Bunod Place 1 drop into the right eye at bedtime.   Xarelto 15 MG Tabs tablet Generic drug: Rivaroxaban TAKE 1 TABLET BY MOUTH EVERY DAY WITH SUPPER        Allergies:  Allergies  Allergen Reactions   Amoxicillin-Pot Clavulanate Nausea And Vomiting    Has patient had a PCN reaction causing immediate rash, facial/tongue/throat swelling, SOB or lightheadedness with hypotension: No Has patient had a PCN reaction causing severe rash involving mucus membranes or skin necrosis: No Has patient had a PCN reaction that required hospitalization: No Has patient had a PCN reaction occurring within the last 10 years: Yes  If all of the above answers are "NO", then may proceed with Cephalosporin use.    Codeine Nausea And Vomiting   Septra [Sulfamethoxazole-Trimethoprim] Other (See Comments)    Reaction: unknown   Simvastatin Other (See Comments)    "did something to muscles"   Sulfa Antibiotics Other (See Comments)    Reaction: unknown   Tramadol Itching and Rash    Family History: Family History  Problem Relation Age of Onset   Hypertension Sister      Social History:  reports that she has never smoked. She has never used smokeless tobacco. She reports current alcohol use. She reports that she does not use drugs.   Physical Exam: BP (!) 145/80   Pulse 76   Ht 5\' 3"  (1.6 m)   Wt 124 lb (56.2 kg)   BMI 21.97 kg/m   Constitutional:  Alert and oriented, No acute distress. HEENT: [place] AT, moist mucus membranes.  Trachea midline, no masses. Cardiovascular: No clubbing, cyanosis, or edema. Respiratory: Normal respiratory effort, no increased work of breathing. Skin: No rashes, bruises or suspicious lesions. Neurologic: Grossly intact, no focal deficits, moving all 4 extremities. Psychiatric: Normal mood and affect.  Laboratory Data: Lab Results  Component Value Date   WBC 6.5 04/05/2021   HGB 13.5 04/05/2021   HCT 41.0 04/05/2021   MCV 96.5 04/05/2021   PLT 229 04/05/2021    Lab Results  Component Value Date   CREATININE 0.79 04/05/2021    Urinalysis UA today is negative   Pertinent Imaging: Results for orders placed or performed in visit on 03/17/22  Bladder Scan (Post Void Residual) in office  Result Value Ref Range   Scan Result 03/19/22      Assessment & Plan:    1. Urge incontinence Primary bothered by worsening urge incontinence.  Urinalysis negative and PVR is minimal, no concern for overflow incontinence as contributing factors.  We discussed the algorithm for treatment of severe OAB/urge incontinence which includes initiation of behavioral modification followed by pharmacotherapy and then second line agent such as Botox, PTNS or InterStim.  Given her age as well as propensity for constipation, would strongly prefer to avoid anticholinergics.  She agrees with this.  She was given samples of Gemtesa 75 mg today x6 weeks for trial. - Urinalysis, Complete - Bladder Scan (Post Void Residual) in office  2. Stress incontinence, female We discussed the pathophysiology of stress incontinence, given instructions on  pelvic floor exercises.  She may benefit from physical therapy if she fails to improve however her primary bother is #1.  Return for 6 weeks [place] or [place] with PVR.  [place], MD  [facility] [address], [address] [place], [place] [id number]

## 2022-03-20 IMAGING — US US ABDOMEN COMPLETE
1 series · 13 of 25 positions shown · non-contrast
Comparison: Previous studies including abdominal sonogram done on
11/08/2005 and CT done on 05/10/2021

CLINICAL DATA: Upper abdominal pain

EXAM:
ABDOMEN ULTRASOUND COMPLETE

[Series 1: us abdomen complete · 13 of 105 slices shown]
[im 1/105]
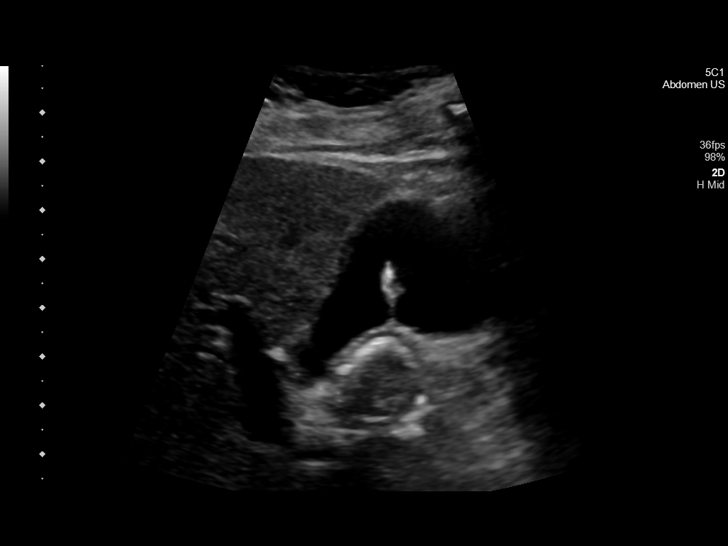
[im 9/105]
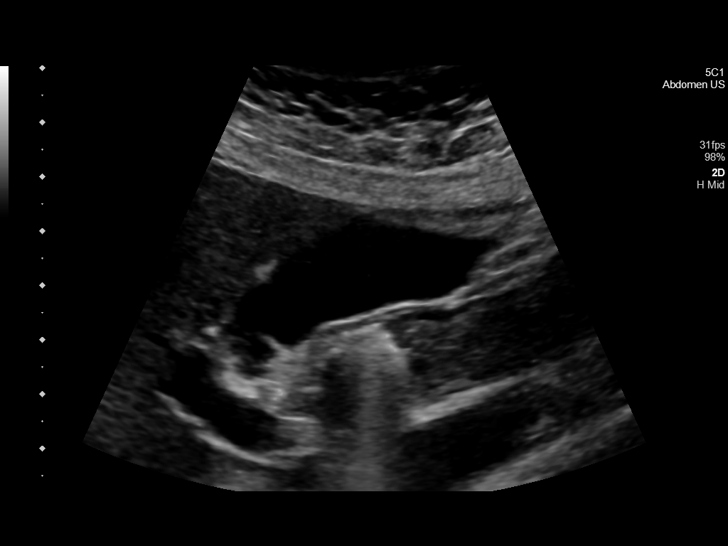
[im 18/105]
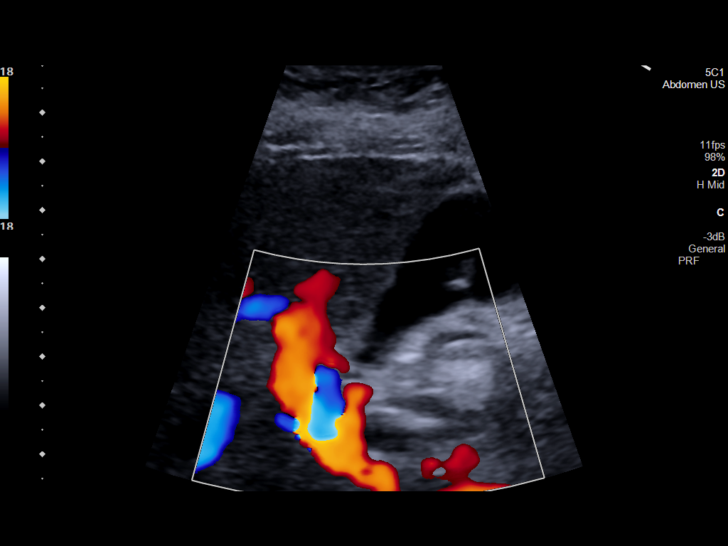
[im 27/105]
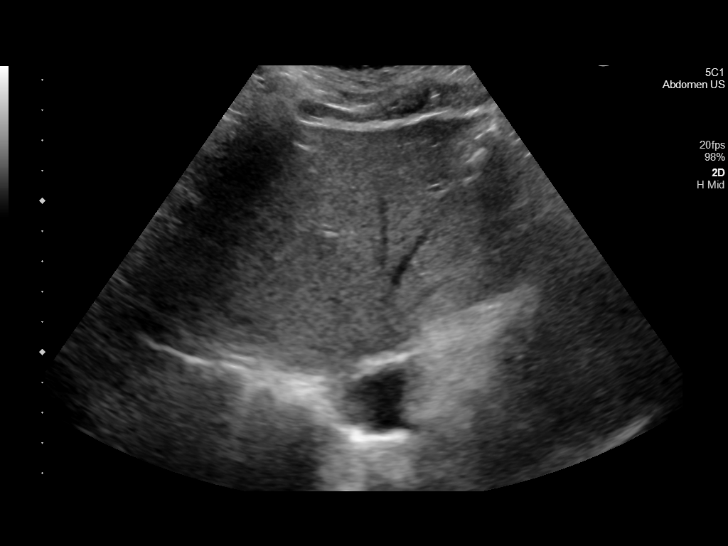
[im 35/105]
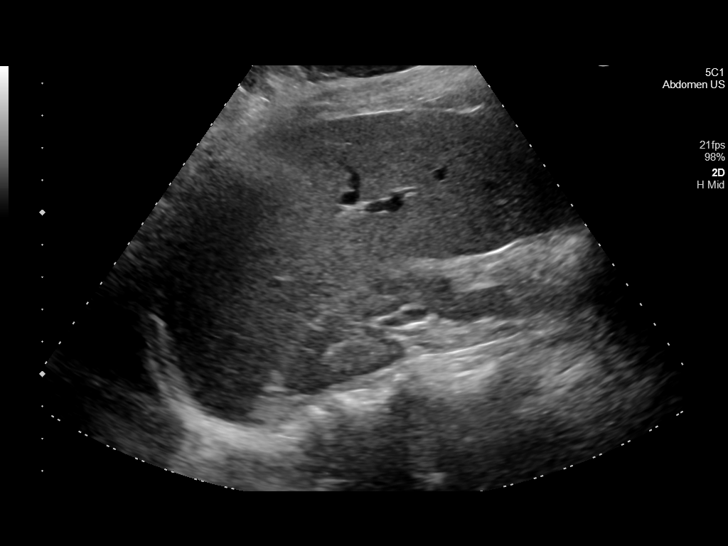
[im 44/105]
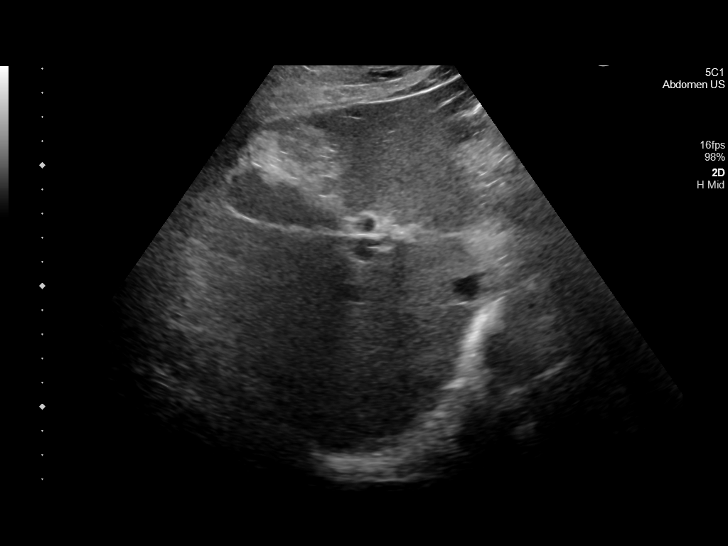
[im 53/105]
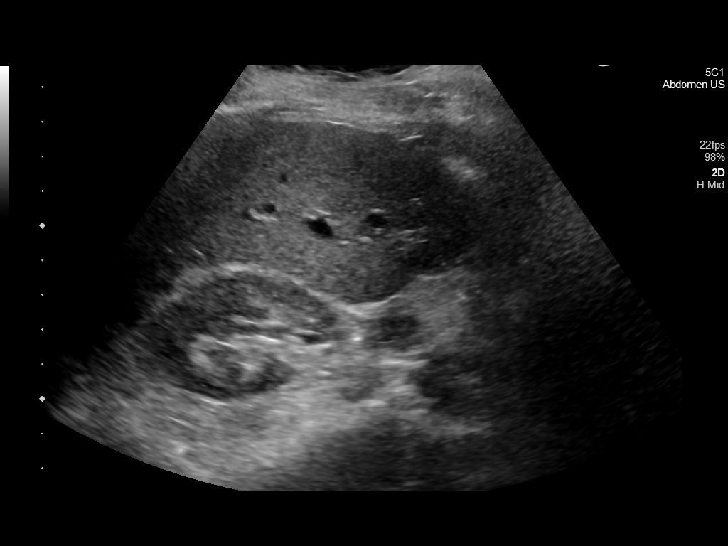
[im 61/105]
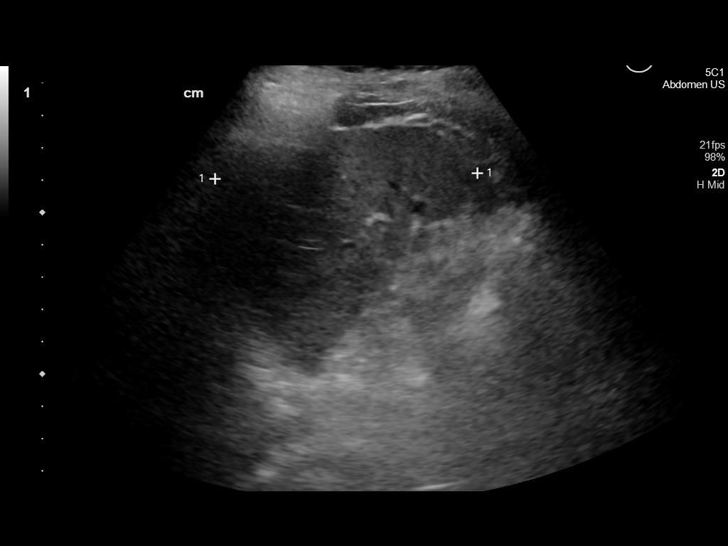
[im 70/105]
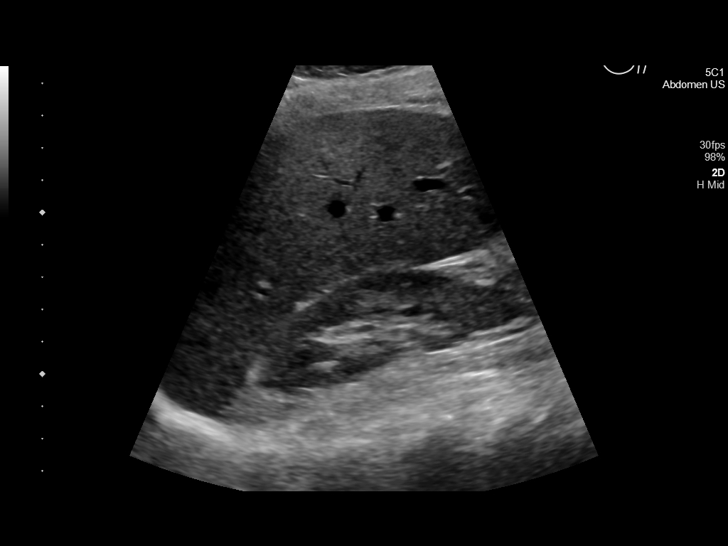
[im 79/105]
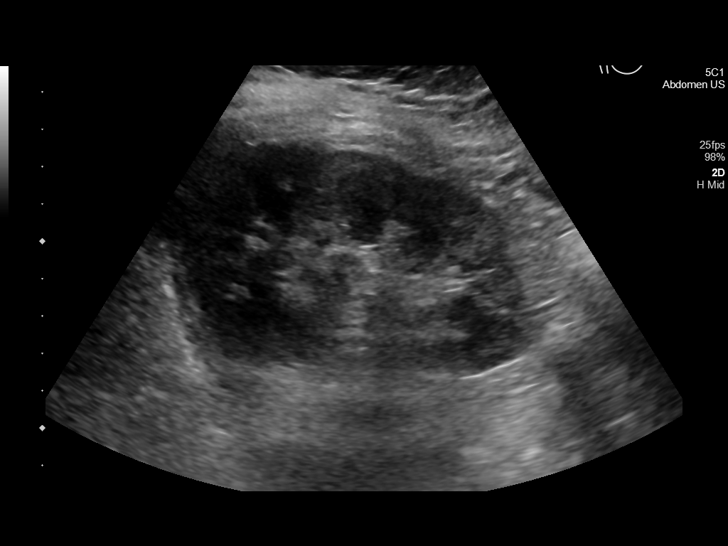
[im 87/105]
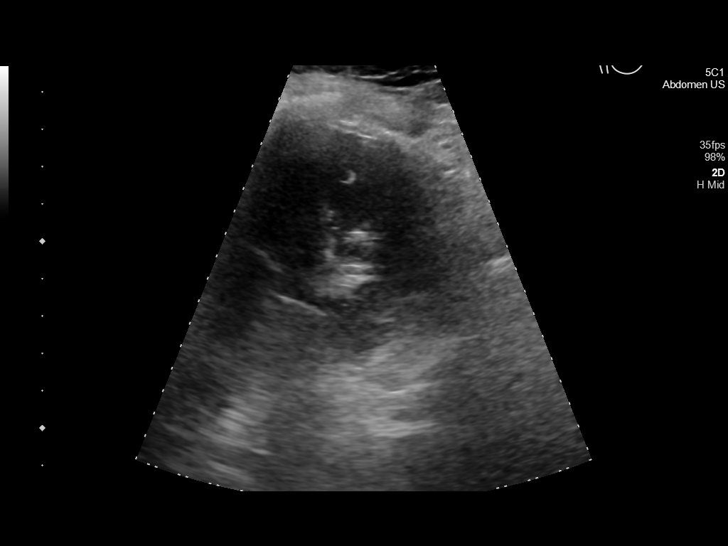
[im 96/105]
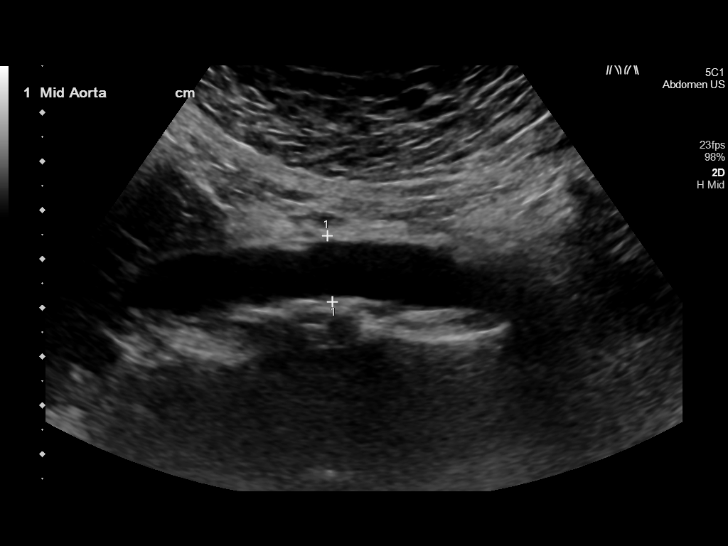
[im 105/105]
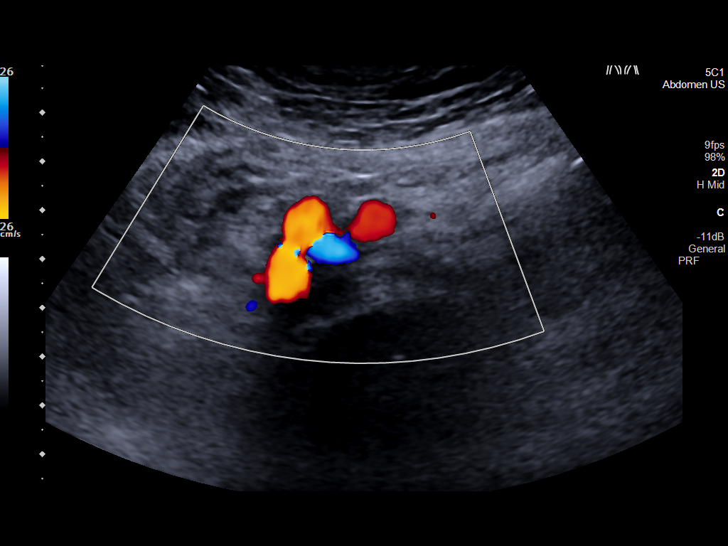

[13 of 25 positions shown; findings below may reference images not displayed]

FINDINGS: Gallbladder: No gallstones or wall thickening visualized. No
sonographic Murphy sign noted by sonographer. There is linear
echogenic focus in the gallbladder, possibly incomplete septum.

Common bile duct: Diameter: 8 mm. Distal course of common bile duct
is not adequately visualized. There is no dilation of intrahepatic
bile ducts.

Liver: No focal lesion identified. Within normal limits in
parenchymal echogenicity. Portal vein is patent on color Doppler
imaging with normal direction of blood flow towards the liver.

IVC: No abnormality visualized.

Pancreas: Visualized portion unremarkable.

Spleen: Size and appearance within normal limits.

Right Kidney: Length: 9.2 cm. Echogenicity within normal limits. No
mass or hydronephrosis visualized.

Left Kidney: Length: 10.1 cm. Echogenicity within normal limits. No
mass or hydronephrosis visualized.

Abdominal aorta: No aneurysm visualized.

Other findings: None.
IMPRESSION: No significant sonographic abnormality is seen in the abdomen.

Common bile duct is prominent measuring 8 mm in its proximal course.
This may be normal variation or suggest partial obstruction in the
distal common bile duct. Distal common bile duct is not optimally
visualized. Please correlate with laboratory findings.

In the some of the images, there is linear echogenic focus in the
gallbladder, possibly an incomplete septation. There are no definite
sonographic signs of gallbladder stones or acute cholecystitis.

## 2022-03-28 ENCOUNTER — Encounter (INDEPENDENT_AMBULATORY_CARE_PROVIDER_SITE_OTHER): Payer: Self-pay

## 2022-04-06 ENCOUNTER — Other Ambulatory Visit: Payer: Self-pay | Admitting: Cardiovascular Disease

## 2022-04-15 ENCOUNTER — Other Ambulatory Visit: Payer: Self-pay | Admitting: Cardiovascular Disease

## 2022-04-15 ENCOUNTER — Other Ambulatory Visit: Payer: Self-pay | Admitting: Nurse Practitioner

## 2022-04-15 MED ORDER — METOPROLOL SUCCINATE ER 25 MG PO TB24: 25.0000 mg | ORAL_TABLET | Freq: Every day | ORAL | 3 refills | Status: DC

## 2022-04-15 NOTE — Telephone Encounter (Signed)
   Pt called after hours requesting refill on toprol xl 25mg  daily through [facility].  She was last seen in 11/2021 but has a f/u scheduled for 04/29/2022.  I sent in a Rx for toprol xl 25mg  daily, #90, 3 refills to [facility].   Caller verbalized understanding and was grateful for the call back.  14/05/2021, NP 04/15/2022, 7:33 PM

## 2022-04-26 NOTE — Progress Notes (Signed)
Cardiology Office Note  Date:  04/29/2022   ID:  Jacqueline Powers, Jacqueline Powers 1935-02-02, MRN [id number]  PCP:  Gracelyn Nurse, MD   Chief Complaint  Patient presents with   12 month follow up     "Doing well." Medications reviewed by the patient verbally.     HPI:  Jacqueline Powers is a pleasant 87 year old woman with a history of  hyperlipidemia,  Hashimoto's thyroiditis with elevated TSH, osteoporosis,  remote history of chest pain,  GERD,  previously evaluated for jaw pain, neck pain, tachycardia CT coronary calcium score (score of 17) history of neck  surgery, fusion  30 day monitor documented atrial fibrillation.  Prior TIA She presents today for follow-up of her atrial fibrillation   Last seen by myself in clinic July 2022 Better after back surgery In general reports she is doing well Was concerned about the number of medication she was taking and stopped the Crestor and Zetia Cholesterol numbers higher as detailed below  Rare palpitations, 15 min, random, possible atrial fibrillation Creatinine clearance 46, on Xarelto 15 mg daily  Total chol 297, LDL 210 off medication crestor (worried about memory) Weldon Picking (unclear side effects)  EKG personally reviewed by myself on todays visit Normal sinus rhythm rate 71 bpm no significant ST-T wave changes  Other past medical history reviewed Prior back MRI Acute or subacute compression fractures at T12 and L1 with mild loss of height and no significant endplate retropulsion.   Multilevel degenerative changes as detailed above. Most notably, right foraminal disc protrusion at L4-L5 with compression of exiting L4 nerve root.  ABD u/s , Minimal atherosclerosis in the abdominal aorta.  Several CT scans abdomen review on today's visit dating back 2018, 2019, 2020 documenting moderate stenosis of mesenteric vessels  visit in the emergency room 03/26/16: atrial fib rate 150 bpm She was sleeping when she was woken up by severe  tachycardia EKG showing atrial fibrillation, converted to NSR w/o intervention  PreviousCT scan imaging reviewed with her showing mild coronary calcification, mild descending aortic calcification     PMH:   has a past medical history of Arthritis, Asthma, Carpal tunnel syndrome, Colonic polyp, Complication of anesthesia, Degenerative joint disease of cervical and lumbar spine, Dysrhythmia, GERD (gastroesophageal reflux disease), Glaucoma, Hashimoto's thyroiditis, Hiatal hernia, History of depression, Hyperlipidemia, Hypothyroidism, Irritable bowel syndrome, Osteoporosis, PAF (paroxysmal atrial fibrillation) (HCC), Perennial allergic rhinitis, Right knee meniscal tear, and TIA (transient ischemic attack).  PSH:    Past Surgical History:  Procedure Laterality Date   BACK SURGERY     L5   CATARACT EXTRACTION     CERVICAL CONE BIOPSY     CERVICAL FUSION     COLONOSCOPY     COLONOSCOPY WITH PROPOFOL N/A 01/20/2017   Procedure: COLONOSCOPY WITH PROPOFOL;  Surgeon: Scot Jun, MD;  Location: [facility] ENDOSCOPY;  Service: Endoscopy;  Laterality: N/A;   COLONOSCOPY WITH PROPOFOL N/A 07/21/2020   Procedure: COLONOSCOPY WITH PROPOFOL;  Surgeon: Regis Bill, MD;  Location: [facility] ENDOSCOPY;  Service: Endoscopy;  Laterality: N/A;   ESOPHAGOGASTRODUODENOSCOPY N/A 07/21/2020   Procedure: ESOPHAGOGASTRODUODENOSCOPY (EGD);  Surgeon: Regis Bill, MD;  Location: [facility] ENDOSCOPY;  Service: Endoscopy;  Laterality: N/A;   ESOPHAGOGASTRODUODENOSCOPY (EGD) WITH PROPOFOL N/A 01/20/2017   Procedure: ESOPHAGOGASTRODUODENOSCOPY (EGD) WITH PROPOFOL;  Surgeon: Scot Jun, MD;  Location: [facility] ENDOSCOPY;  Service: Endoscopy;  Laterality: N/A;   ESOPHAGOGASTRODUODENOSCOPY (EGD) WITH PROPOFOL N/A 04/28/2017   Procedure: ESOPHAGOGASTRODUODENOSCOPY (EGD) WITH PROPOFOL;  Surgeon: Scot Jun, MD;  Location: [facility] ENDOSCOPY;  Service: Endoscopy;  Laterality: N/A;   KYPHOPLASTY N/A 04/08/2021    Procedure: KYPHOPLASTY T12, L1;  Surgeon: Tressie Stalker, MD;  Location: [facility] OR;  Service: Neurosurgery;  Laterality: N/A;   TONSILLECTOMY      Current Outpatient Medications  Medication Sig Dispense Refill   Albuterol Sulfate (PROAIR RESPICLICK) 108 (90 Base) MCG/ACT AEPB Inhale into the lungs as needed.     allopurinol (ZYLOPRIM) 100 MG tablet Take 100 mg by mouth daily.     azelastine (ASTELIN) 0.1 % nasal spray Place 1 spray into both nostrils 2 (two) times daily as needed for allergies. Use in each nostril as directed     Cholecalciferol (VITAMIN D) 50 MCG (2000 UT) CAPS Take 4,000 Units by mouth daily.     dorzolamide-timolol (COSOPT) 22.3-6.8 MG/ML ophthalmic solution Place 1 drop into both eyes 2 (two) times daily.     ezetimibe (ZETIA) 10 MG tablet Take 1 tablet by mouth as needed.     fluticasone (FLONASE) 50 MCG/ACT nasal spray Place 2 sprays into both nostrils at bedtime as needed for allergies.      levothyroxine (SYNTHROID, LEVOTHROID) 25 MCG tablet Take 25 mcg by mouth daily before breakfast.      LUMIGAN 0.01 % SOLN Place 1 drop into the left eye at bedtime.     metoprolol succinate (TOPROL-XL) 25 MG 24 hr tablet Take 1 tablet (25 mg total) by mouth daily. 90 tablet 3   omeprazole (PRILOSEC) 40 MG capsule Take 40 mg by mouth daily as needed (acid reflux).     VYZULTA 0.024 % SOLN Place 1 drop into the right eye at bedtime.     XARELTO 15 MG TABS tablet TAKE 1 TABLET BY MOUTH EVERY DAY WITH SUPPER 90 tablet 1   diltiazem (CARDIZEM) 30 MG tablet Take 1 tablet (30 mg total) by mouth 3 (three) times daily as needed (As needed for fast heart rates.). (Patient not taking: Reported on 04/29/2022) 90 tablet 3   ondansetron (ZOFRAN) 4 MG tablet Take 1 tablet (4 mg total) by mouth every 6 (six) hours as needed for nausea or vomiting. (Patient not taking: Reported on 04/29/2022) 20 tablet 0   No current facility-administered medications for this visit.     Allergies:   Amoxicillin-pot  clavulanate, Codeine, Simvastatin, Sulfa antibiotics, Sulfamethoxazole-trimethoprim, and Tramadol   Social History:  The patient  reports that she has never smoked. She has never used smokeless tobacco. She reports current alcohol use. She reports that she does not use drugs.   Family History:   family history includes Hypertension in her sister.    Review of Systems: Review of Systems  Constitutional: Negative.   HENT: Negative.    Respiratory: Negative.    Cardiovascular: Negative.   Gastrointestinal: Negative.   Musculoskeletal: Negative.   Neurological: Negative.   Psychiatric/Behavioral: Negative.    All other systems reviewed and are negative.   PHYSICAL EXAM: VS:  BP 130/72 (BP Location: Left Arm, Patient Position: Sitting, Cuff Size: Normal)   Pulse 71   Ht 5\' 3"  (1.6 m)   Wt 126 lb 2 oz (57.2 kg)   SpO2 98%   BMI 22.34 kg/m  , BMI Body mass index is 22.34 kg/m. Constitutional:  oriented to person, place, and time. No distress.  HENT:  Head: Grossly normal Eyes:  no discharge. No scleral icterus.  Neck: No JVD, no carotid bruits  Cardiovascular: Regular rate and rhythm, no murmurs appreciated Pulmonary/Chest: Clear to auscultation bilaterally,  no wheezes or rails Abdominal: Soft.  no distension.  no tenderness.  Musculoskeletal: Normal range of motion Neurological:  normal muscle tone. Coordination normal. No atrophy Skin: Skin warm and dry Psychiatric: normal affect, pleasant   Recent Labs: No results found for requested labs within last 365 days.    Lipid Panel Lab Results  Component Value Date   CHOL 140 09/08/2016   HDL 62 09/08/2016   LDLCALC 64 09/08/2016   TRIG 71 09/08/2016     Wt Readings from Last 3 Encounters:  04/29/22 126 lb 2 oz (57.2 kg)  03/17/22 124 lb (56.2 kg)  04/08/21 127 lb (57.6 kg)     ASSESSMENT AND PLAN:   Paroxysmal atrial fibrillation (HCC) - maintaining normal sinus rhythm, stable, rare palpitations Tolerating  anticoagulation Creatinine clearance 48, on xarelto 15 mg daily  Other specified transient cerebral ischemias On anticoagulation, Prior TIAs stable  Hyperlipidemia/peripheral arterial disease Restart zetia and Crestor Without medications total cholesterol up to 300  PAD mesenteric disease,  Stressed importance of staying on Zetia and Crestor with goal LDL less than 70  GI discomfort Pancreatic issues in the past  Chest pain, unspecified chest pain type No sx, no further ischemic work-up needed  Encounter for anticoagulation discussion and counseling Tolerating Xarelto 15 mg daily    Total encounter time more than 30 minutes  Greater than 50% was spent in counseling and coordination of care with the patient     Orders Placed This Encounter  Procedures   EKG 12-Lead     Signed, Dossie Arbour, M.D., Ph.D. 04/29/2022  [facility] [place], [place] [phone]

## 2022-04-28 ENCOUNTER — Ambulatory Visit: Payer: Medicare Other | Admitting: Urology

## 2022-04-29 ENCOUNTER — Encounter: Payer: Self-pay | Admitting: Cardiovascular Disease

## 2022-04-29 ENCOUNTER — Ambulatory Visit: Payer: Medicare Other | Attending: Cardiovascular Disease | Admitting: Cardiovascular Disease

## 2022-04-29 VITALS — BP 130/72 | HR 71 | Ht 63.0 in | Wt 126.1 lb

## 2022-04-29 DIAGNOSIS — I480 Paroxysmal atrial fibrillation: Secondary | ICD-10-CM | POA: Diagnosis not present

## 2022-04-29 DIAGNOSIS — R0789 Other chest pain: Secondary | ICD-10-CM | POA: Diagnosis not present

## 2022-04-29 DIAGNOSIS — I700 Atherosclerosis of aorta: Secondary | ICD-10-CM

## 2022-04-29 DIAGNOSIS — J4530 Mild persistent asthma, uncomplicated: Secondary | ICD-10-CM

## 2022-04-29 DIAGNOSIS — E782 Mixed hyperlipidemia: Secondary | ICD-10-CM | POA: Diagnosis not present

## 2022-04-29 DIAGNOSIS — I6523 Occlusion and stenosis of bilateral carotid arteries: Secondary | ICD-10-CM

## 2022-04-29 DIAGNOSIS — K551 Chronic vascular disorders of intestine: Secondary | ICD-10-CM

## 2022-04-29 DIAGNOSIS — G459 Transient cerebral ischemic attack, unspecified: Secondary | ICD-10-CM

## 2022-04-29 MED ORDER — ROSUVASTATIN CALCIUM 5 MG PO TABS: 5.0000 mg | ORAL_TABLET | Freq: Every day | ORAL | Status: DC

## 2022-04-29 NOTE — Patient Instructions (Addendum)
Medication Instructions:   1) Stay on crestor (rosuvastatin) 5 mg: - take 1 tablet by mouth once daily   2) STAY on zetia (ezetimibe) 10 mg - take 1 tablet by mouth once daily  If you need a refill on your cardiac medications before your next appointment, please call your pharmacy.    Lab work: No new labs needed   Testing/Procedures: 1) Carotid ultrasound: (for stenosis, PAD) - Your physician has requested that you have a carotid duplex. This test is an ultrasound of the carotid arteries in your neck. It looks at blood flow through these arteries that supply the brain with blood. Allow one hour for this exam. There are no restrictions or special instructions.    Follow-Up: At [facility], you and your health needs are our priority.  As part of our continuing mission to provide you with exceptional heart care, we have created designated Provider Care Teams.  These Care Teams include your primary Cardiologist (physician) and Advanced Practice Providers (APPs -  Physician Assistants and Nurse Practitioners) who all work together to provide you with the care you need, when you need it.  You will need a follow up appointment in 12 months  Providers on your designated Care Team:   Nicolasa Ducking, NP Eula Listen, PA-C Cadence Fransico [place], [place]  COVID-19 Vaccine Information can be found at: [email or web address] For questions related to vaccine distribution or appointments, please email [email or web address] or call [phone].

## 2022-05-02 NOTE — Progress Notes (Unsigned)
05/03/2022 12:03 PM   [place] 08-23-1934 [id number]  Referring provider: Baxter Hire, MD [place],  [place] [address]  Urological history: 1. Urge incontinence -contributing factors of age, TIA, lumbar DDD, depression and IBS -PVR 0 mL  2. Stress incontinence -contributing factors of age, TIA, lumbar DDD, depression and IBS -PVR 0 mL    Chief Complaint  Patient presents with   Other    HPI: Jacqueline Powers is a 87 y.o. female who presents today for 6 week follow up.   She was seen by Dr. Erlene Quan on 03/17/2022 as a new patient for incontinence.  She has been dealing with incontinence for several years.  She had symptoms of both urge and stress incontinence, but the urge was felt to be more bothersome.  She was started on Gemtesa 75 mg samples given.  She was also given instructions on Kegel exercises.  She is having 1-7 daytime urinations, nocturia x2 with a strong urge to urinate.  She is having both stress and urge incontinence.  She is leaking 1-2 times daily.  She wears 2 panty liners with an absorbent pad daily.  She does engage in toilet mapping.  PVR 0 mL   The Gemtesa is effective, but it was cost prohibitive.  She was having really good success with the medication.   PMH: Past Medical History:  Diagnosis Date   Arthritis    Asthma    Carpal tunnel syndrome    Colonic polyp    Complication of anesthesia    "took me a long to wake up"   Degenerative joint disease of cervical and lumbar spine    Dysrhythmia    GERD (gastroesophageal reflux disease)    Glaucoma    Hashimoto's thyroiditis    a. 05/2002 s/p resection of thyroid nodule-->on replacement.   Hiatal hernia    History of depression    Hyperlipidemia    Hypothyroidism    Irritable bowel syndrome    Osteoporosis    PAF (paroxysmal atrial fibrillation) (HCC)    a. CHA2DS2VASc = 5-->xarelto;     Perennial allergic rhinitis    Right knee meniscal tear    TIA  (transient ischemic attack)    a. 06/2013: LUE/LLE wkns x 15 mins, MRI neg for CVA.    Surgical History: Past Surgical History:  Procedure Laterality Date   BACK SURGERY     L5   CATARACT EXTRACTION     CERVICAL CONE BIOPSY     CERVICAL FUSION     COLONOSCOPY     COLONOSCOPY WITH PROPOFOL N/A 01/20/2017   Procedure: COLONOSCOPY WITH PROPOFOL;  Surgeon: Manya Silvas, MD;  Location: [facility] ENDOSCOPY;  Service: Endoscopy;  Laterality: N/A;   COLONOSCOPY WITH PROPOFOL N/A 07/21/2020   Procedure: COLONOSCOPY WITH PROPOFOL;  Surgeon: Lesly Rubenstein, MD;  Location: [facility] ENDOSCOPY;  Service: Endoscopy;  Laterality: N/A;   ESOPHAGOGASTRODUODENOSCOPY N/A 07/21/2020   Procedure: ESOPHAGOGASTRODUODENOSCOPY (EGD);  Surgeon: Lesly Rubenstein, MD;  Location: [facility] ENDOSCOPY;  Service: Endoscopy;  Laterality: N/A;   ESOPHAGOGASTRODUODENOSCOPY (EGD) WITH PROPOFOL N/A 01/20/2017   Procedure: ESOPHAGOGASTRODUODENOSCOPY (EGD) WITH PROPOFOL;  Surgeon: Manya Silvas, MD;  Location: [facility] ENDOSCOPY;  Service: Endoscopy;  Laterality: N/A;   ESOPHAGOGASTRODUODENOSCOPY (EGD) WITH PROPOFOL N/A 04/28/2017   Procedure: ESOPHAGOGASTRODUODENOSCOPY (EGD) WITH PROPOFOL;  Surgeon: Manya Silvas, MD;  Location: [facility] ENDOSCOPY;  Service: Endoscopy;  Laterality: N/A;   KYPHOPLASTY N/A 04/08/2021   Procedure: KYPHOPLASTY T12, L1;  Surgeon:  Tressie Stalker, MD;  Location: [facility] OR;  Service: Neurosurgery;  Laterality: N/A;   TONSILLECTOMY      Home Medications:  Allergies as of 05/03/2022       Reactions   Amoxicillin-pot Clavulanate Nausea And Vomiting   Has patient had a PCN reaction causing immediate rash, facial/tongue/throat swelling, SOB or lightheadedness with hypotension: No Has patient had a PCN reaction causing severe rash involving mucus membranes or skin necrosis: No Has patient had a PCN reaction that required hospitalization: No Has patient had a PCN reaction occurring within the last 10  years: Yes  If all of the above answers are "NO", then may proceed with Cephalosporin use.   Codeine Nausea And Vomiting   Simvastatin Other (See Comments)   "did something to muscles"   Sulfa Antibiotics Other (See Comments)   Reaction: unknown   Sulfamethoxazole-trimethoprim Other (See Comments), Nausea And Vomiting   Reaction: unknown   Tramadol Itching, Rash        Medication List        Accurate as of May 03, 2022 12:03 PM. If you have any questions, ask your nurse or doctor.          STOP taking these medications    diltiazem 30 MG tablet Commonly known as: CARDIZEM   ondansetron 4 MG tablet Commonly known as: ZOFRAN       TAKE these medications    Albuterol Sulfate 108 (90 Base) MCG/ACT Aepb Commonly known as: PROAIR RESPICLICK Inhale into the lungs as needed.   allopurinol 100 MG tablet Commonly known as: ZYLOPRIM Take 100 mg by mouth daily.   azelastine 0.1 % nasal spray Commonly known as: ASTELIN Place 1 spray into both nostrils 2 (two) times daily as needed for allergies. Use in each nostril as directed   dorzolamide-timolol 2-0.5 % ophthalmic solution Commonly known as: COSOPT Place 1 drop into both eyes 2 (two) times daily.   ezetimibe 10 MG tablet Commonly known as: ZETIA Take 1 tablet (10 mg) by mouth once daily   fluticasone 50 MCG/ACT nasal spray Commonly known as: FLONASE Place 2 sprays into both nostrils at bedtime as needed for allergies.   levothyroxine 25 MCG tablet Commonly known as: SYNTHROID Take 25 mcg by mouth daily before breakfast.   Lumigan 0.01 % Soln Generic drug: bimatoprost Place 1 drop into the left eye at bedtime.   metoprolol succinate 25 MG 24 hr tablet Commonly known as: TOPROL-XL Take 1 tablet (25 mg total) by mouth daily.   omeprazole 40 MG capsule Commonly known as: PRILOSEC Take 40 mg by mouth daily as needed (acid reflux).   rosuvastatin 5 MG tablet Commonly known as: CRESTOR Take 1 tablet  (5 mg total) by mouth daily.   Trospium Chloride 60 MG Cp24 Take 1 capsule (60 mg total) by mouth daily.   Vitamin D 50 MCG (2000 UT) Caps Take 4,000 Units by mouth daily.   Vyzulta 0.024 % Soln Generic drug: Latanoprostene Bunod Place 1 drop into the right eye at bedtime.   Xarelto 15 MG Tabs tablet Generic drug: Rivaroxaban TAKE 1 TABLET BY MOUTH EVERY DAY WITH SUPPER        Allergies:  Allergies  Allergen Reactions   Amoxicillin-Pot Clavulanate Nausea And Vomiting    Has patient had a PCN reaction causing immediate rash, facial/tongue/throat swelling, SOB or lightheadedness with hypotension: No Has patient had a PCN reaction causing severe rash involving mucus membranes or skin necrosis: No Has patient had a PCN reaction  that required hospitalization: No Has patient had a PCN reaction occurring within the last 10 years: Yes  If all of the above answers are "NO", then may proceed with Cephalosporin use.    Codeine Nausea And Vomiting   Simvastatin Other (See Comments)    "did something to muscles"   Sulfa Antibiotics Other (See Comments)    Reaction: unknown   Sulfamethoxazole-Trimethoprim Other (See Comments) and Nausea And Vomiting    Reaction: unknown   Tramadol Itching and Rash    Family History: Family History  Problem Relation Age of Onset   Hypertension Sister     Social History:  reports that she has never smoked. She has never used smokeless tobacco. She reports current alcohol use. She reports that she does not use drugs.  ROS: Pertinent ROS in HPI  Physical Exam: BP (!) 148/84   Pulse 73   Ht 5\' 3"  (1.6 m)   Wt 122 lb (55.3 kg)   BMI 21.61 kg/m   Constitutional:  Well nourished. Alert and oriented, No acute distress. HEENT: [place] AT, moist mucus membranes.  Trachea midline Cardiovascular: No clubbing, cyanosis, or edema. Respiratory: Normal respiratory effort, no increased work of breathing. Neurologic: Grossly intact, no focal deficits, moving  all 4 extremities. Psychiatric: Normal mood and affect.    Laboratory Data: N/A   Pertinent Imaging:  05/03/22 11:26  Scan Result 0     Assessment & Plan:    1. Urge incontinence -significant improvement on the [place], but it will be cost prohibitive -According to epic and her insurance, [place] XL 60 mg is a tier one drug -I have given her a printed prescription so that she can call her insurance and verify -If it is not cost prohibitive she will call the office and I will send her a prescription and she will follow-up in 3 months  2. Stress incontinence -see # 2  Return for patient to call .  These notes generated with voice recognition software. I apologize for typographical errors.  [place], [place] [address]  [place], [place] [address] [phone]

## 2022-05-03 ENCOUNTER — Ambulatory Visit (INDEPENDENT_AMBULATORY_CARE_PROVIDER_SITE_OTHER): Payer: Medicare Other | Admitting: Urology

## 2022-05-03 ENCOUNTER — Encounter: Payer: Self-pay | Admitting: Urology

## 2022-05-03 VITALS — BP 148/84 | HR 73 | Ht 63.0 in | Wt 122.0 lb

## 2022-05-03 DIAGNOSIS — N393 Stress incontinence (female) (male): Secondary | ICD-10-CM

## 2022-05-03 DIAGNOSIS — N3946 Mixed incontinence: Secondary | ICD-10-CM | POA: Diagnosis not present

## 2022-05-03 DIAGNOSIS — N3941 Urge incontinence: Secondary | ICD-10-CM

## 2022-05-03 LAB — BLADDER SCAN AMB NON-IMAGING: Scan Result: 0

## 2022-05-03 MED ORDER — TROSPIUM CHLORIDE ER 60 MG PO CP24: 60.0000 mg | ORAL_CAPSULE | Freq: Every day | ORAL | 0 refills | Status: AC

## 2022-07-06 ENCOUNTER — Other Ambulatory Visit: Payer: Self-pay | Admitting: Gastroenterology

## 2022-07-06 DIAGNOSIS — R1084 Generalized abdominal pain: Secondary | ICD-10-CM

## 2022-07-18 ENCOUNTER — Ambulatory Visit
Admission: RE | Admit: 2022-07-18 | Discharge: 2022-07-18 | Disposition: A | Discharge status: Home / Self Care | Payer: Medicare Other | Source: Ambulatory Visit | Attending: Gastroenterology | Admitting: Gastroenterology

## 2022-07-18 DIAGNOSIS — R1084 Generalized abdominal pain: Secondary | ICD-10-CM | POA: Diagnosis present

## 2022-07-18 MED ORDER — IOHEXOL 300 MG/ML  SOLN
85.0000 mL | Freq: Once | INTRAMUSCULAR | Status: AC | PRN
  Administered 2022-07-18: 85 mL via INTRAVENOUS

## 2022-07-28 ENCOUNTER — Ambulatory Visit: Payer: Medicare Other | Attending: Cardiovascular Disease

## 2022-07-28 DIAGNOSIS — I6523 Occlusion and stenosis of bilateral carotid arteries: Secondary | ICD-10-CM

## 2022-08-11 ENCOUNTER — Telehealth: Payer: Self-pay | Admitting: Cardiovascular Disease

## 2022-08-11 MED ORDER — RIVAROXABAN 15 MG PO TABS: ORAL_TABLET | ORAL | 1 refills | Status: DC

## 2022-08-11 NOTE — Telephone Encounter (Signed)
Please assist with refill.  Thank you.

## 2022-08-11 NOTE — Telephone Encounter (Signed)
*  STAT* If patient is at the pharmacy, call can be transferred to refill team.   1. Which medications need to be refilled? (please list name of each medication and dose if known) XARELTO 15 MG TABS tablet   2. Which pharmacy/location (including street and city if local pharmacy) is medication to be sent to? [place], [place]   3. Do they need a 30 day or 90 day supply? [place]

## 2022-08-11 NOTE — Telephone Encounter (Addendum)
Prescription refill request for Xarelto received.  Indication: PAF Last office visit: 04/29/22  Johnny Bridge MD Weight: 57.2kg Age: 88 Scr: 0.6 on 06/01/22 CrCl: 60.78  Continue Xarelto '15mg'$  daily based on age and prior CrCl 50 or less.  Continue to monitor labs.  Refill approved.

## 2022-08-24 ENCOUNTER — Other Ambulatory Visit: Payer: Self-pay | Admitting: Gastroenterology

## 2022-08-24 DIAGNOSIS — R6881 Early satiety: Secondary | ICD-10-CM

## 2022-08-30 ENCOUNTER — Telehealth: Payer: Self-pay | Admitting: Cardiovascular Disease

## 2022-08-30 DIAGNOSIS — I480 Paroxysmal atrial fibrillation: Secondary | ICD-10-CM

## 2022-08-30 MED ORDER — RIVAROXABAN 15 MG PO TABS: ORAL_TABLET | ORAL | 1 refills | Status: DC

## 2022-08-30 NOTE — Telephone Encounter (Signed)
Prescription refill request for Xarelto received.  Indication: Afib  Last office visit: 04/29/22 Rockey Situ)  Weight: 55.3kg Age: 88 Scr: 0.6 (06/01/22)  CrCl: 59.46ml/min  Appropriate dose. Refill sent.

## 2022-08-30 NOTE — Telephone Encounter (Signed)
*  STAT* If patient is at the pharmacy, call can be transferred to refill team.   1. Which medications need to be refilled? (please list name of each medication and dose if known) Rivaroxaban (XARELTO) 15 MG TABS tablet    2. Which pharmacy/location (including street and city if local pharmacy) is medication to be sent to? [id number] - [place], [place] - [address]   3. Do they need a 30 day or 90 day supply? 90  

## 2022-08-30 NOTE — Telephone Encounter (Signed)
Refill request

## 2022-09-23 ENCOUNTER — Encounter
Admission: RE | Admit: 2022-09-23 | Discharge: 2022-09-23 | Disposition: A | Discharge status: Home / Self Care | Payer: Medicare Other | Source: Ambulatory Visit | Attending: Gastroenterology | Admitting: Gastroenterology

## 2022-09-23 DIAGNOSIS — R6881 Early satiety: Secondary | ICD-10-CM | POA: Insufficient documentation

## 2022-09-23 MED ORDER — TECHNETIUM TC 99M SULFUR COLLOID
2.0200 | Freq: Once | INTRAVENOUS | Status: AC | PRN
  Administered 2022-09-23: 2.02 via ORAL

## 2022-12-05 ENCOUNTER — Telehealth (INDEPENDENT_AMBULATORY_CARE_PROVIDER_SITE_OTHER): Payer: Self-pay | Admitting: Vascular Surgery

## 2022-12-05 NOTE — Telephone Encounter (Signed)
She can see me or Dew with reflux studies, given the schedule, this will likely be in couple of weeks. However if she wants to be seen sooner she can have a DVT only study with will just look to see if there's any blood clot, but she won't see a provider

## 2022-12-05 NOTE — Telephone Encounter (Signed)
Patient called and LVM over holiday weekend stating she needs appt for R leg above knee pain. Called and talked with patient she is stating that Dr. Laural Benes told her "her veins are breaking". She has received shots in her knee for pain in the past maybe this is the cause of the pain. After walking and laying down theres some bleeding. Just wants to check it out and check the blood flow to reassure theres no blood clot and everything is working properly.     Please call and advise

## 2022-12-06 ENCOUNTER — Other Ambulatory Visit (INDEPENDENT_AMBULATORY_CARE_PROVIDER_SITE_OTHER): Payer: Self-pay | Admitting: Nurse Practitioner

## 2022-12-06 DIAGNOSIS — M79604 Pain in right leg: Secondary | ICD-10-CM

## 2022-12-08 ENCOUNTER — Ambulatory Visit (INDEPENDENT_AMBULATORY_CARE_PROVIDER_SITE_OTHER): Payer: Medicare Other

## 2022-12-08 DIAGNOSIS — M79604 Pain in right leg: Secondary | ICD-10-CM

## 2023-01-20 ENCOUNTER — Other Ambulatory Visit (INDEPENDENT_AMBULATORY_CARE_PROVIDER_SITE_OTHER): Payer: Self-pay | Admitting: Nurse Practitioner

## 2023-01-20 DIAGNOSIS — M79606 Pain in leg, unspecified: Secondary | ICD-10-CM

## 2023-01-27 ENCOUNTER — Ambulatory Visit (INDEPENDENT_AMBULATORY_CARE_PROVIDER_SITE_OTHER): Payer: Medicare Other | Admitting: Vascular Surgery

## 2023-01-27 ENCOUNTER — Encounter (INDEPENDENT_AMBULATORY_CARE_PROVIDER_SITE_OTHER): Payer: Self-pay | Admitting: Vascular Surgery

## 2023-01-27 ENCOUNTER — Ambulatory Visit (INDEPENDENT_AMBULATORY_CARE_PROVIDER_SITE_OTHER): Payer: Medicare Other

## 2023-01-27 VITALS — BP 135/74 | HR 65 | Resp 16 | Wt 122.4 lb

## 2023-01-27 DIAGNOSIS — M79605 Pain in left leg: Secondary | ICD-10-CM

## 2023-01-27 DIAGNOSIS — I480 Paroxysmal atrial fibrillation: Secondary | ICD-10-CM

## 2023-01-27 DIAGNOSIS — E782 Mixed hyperlipidemia: Secondary | ICD-10-CM | POA: Diagnosis not present

## 2023-01-27 DIAGNOSIS — M79606 Pain in leg, unspecified: Secondary | ICD-10-CM

## 2023-01-27 DIAGNOSIS — M79609 Pain in unspecified limb: Secondary | ICD-10-CM | POA: Insufficient documentation

## 2023-01-27 DIAGNOSIS — M79604 Pain in right leg: Secondary | ICD-10-CM

## 2023-01-27 NOTE — Assessment & Plan Note (Signed)
Noninvasive studies were performed today showing a right ABI of 1.18 and a left ABI of 1.12 with triphasic waveforms and normal digital pressures bilaterally consistent with no significant arterial disease of the lower extremities.   She has a history of previous back surgery and this appears to be neurogenic symptoms at this point.  She is going to follow-up with her neurosurgeon in [place].  I will see her back as needed.

## 2023-01-27 NOTE — Progress Notes (Signed)
Patient ID: Jacqueline Powers, female   DOB: 07-11-1934, 88 y.o.   MRN: [id number]  Chief Complaint  Patient presents with   Follow-up    Ultrasound follow up    HPI Jacqueline Powers is a 88 y.o. female.  I am asked to see the patient by Dr. Letitia Libra for evaluation of leg pain and numbness.  I have seen the patient over 3 years ago for abdominal pain for evaluation of mesenteric ischemia.  She had 2 negative duplex studies and so we had not seen her back for over 3 years.  She has been having pain as well as numbness on the feet and lower legs.  The legs feel tight.  This is positionally related.  She notices this more with laying down in certain positions.  Walking makes it better.  The right leg is slightly more affected than the left leg.  No ulceration or infection.  No chest pain or shortness of breath.  Noninvasive studies were performed today showing a right ABI of 1.18 and a left ABI of 1.12 with triphasic waveforms and normal digital pressures bilaterally consistent with no significant arterial disease of the lower extremities.     Past Medical History:  Diagnosis Date   Arthritis    Asthma    Carpal tunnel syndrome    Colonic polyp    Complication of anesthesia    "took me a long to wake up"   Degenerative joint disease of cervical and lumbar spine    Dysrhythmia    GERD (gastroesophageal reflux disease)    Glaucoma    Hashimoto's thyroiditis    a. 05/2002 s/p resection of thyroid nodule-->on replacement.   Hiatal hernia    History of depression    Hyperlipidemia    Hypertension    Hypothyroidism    Irritable bowel syndrome    Osteoporosis    PAF (paroxysmal atrial fibrillation) (HCC)    a. CHA2DS2VASc = 5-->xarelto;     Perennial allergic rhinitis    Right knee meniscal tear    TIA (transient ischemic attack)    a. 06/2013: LUE/LLE wkns x 15 mins, MRI neg for CVA.    Past Surgical History:  Procedure Laterality Date   BACK SURGERY     L5   CATARACT EXTRACTION      CERVICAL CONE BIOPSY     CERVICAL FUSION     COLONOSCOPY     COLONOSCOPY WITH PROPOFOL N/A 01/20/2017   Procedure: COLONOSCOPY WITH PROPOFOL;  Surgeon: Scot Jun, MD;  Location: [facility] ENDOSCOPY;  Service: Endoscopy;  Laterality: N/A;   COLONOSCOPY WITH PROPOFOL N/A 07/21/2020   Procedure: COLONOSCOPY WITH PROPOFOL;  Surgeon: Regis Bill, MD;  Location: [facility] ENDOSCOPY;  Service: Endoscopy;  Laterality: N/A;   ESOPHAGOGASTRODUODENOSCOPY N/A 07/21/2020   Procedure: ESOPHAGOGASTRODUODENOSCOPY (EGD);  Surgeon: Regis Bill, MD;  Location: [facility] ENDOSCOPY;  Service: Endoscopy;  Laterality: N/A;   ESOPHAGOGASTRODUODENOSCOPY (EGD) WITH PROPOFOL N/A 01/20/2017   Procedure: ESOPHAGOGASTRODUODENOSCOPY (EGD) WITH PROPOFOL;  Surgeon: Scot Jun, MD;  Location: [facility] ENDOSCOPY;  Service: Endoscopy;  Laterality: N/A;   ESOPHAGOGASTRODUODENOSCOPY (EGD) WITH PROPOFOL N/A 04/28/2017   Procedure: ESOPHAGOGASTRODUODENOSCOPY (EGD) WITH PROPOFOL;  Surgeon: Scot Jun, MD;  Location: [facility] ENDOSCOPY;  Service: Endoscopy;  Laterality: N/A;   KYPHOPLASTY N/A 04/08/2021   Procedure: KYPHOPLASTY T12, L1;  Surgeon: Tressie Stalker, MD;  Location: [facility] OR;  Service: Neurosurgery;  Laterality: N/A;   TONSILLECTOMY       Family History  Problem  Relation Age of Onset   Hypertension Sister   No bleeding or clotting disorders No aneurysms   Social History   Tobacco Use   Smoking status: Never   Smokeless tobacco: Never  Vaping Use   Vaping status: Never Used  Substance Use Topics   Alcohol use: Yes    Comment: occasional   Drug use: No     Allergies  Allergen Reactions   Amoxicillin-Pot Clavulanate Nausea And Vomiting    Has patient had a PCN reaction causing immediate rash, facial/tongue/throat swelling, SOB or lightheadedness with hypotension: No Has patient had a PCN reaction causing severe rash involving mucus membranes or skin necrosis: No Has patient had a PCN  reaction that required hospitalization: No Has patient had a PCN reaction occurring within the last 10 years: Yes  If all of the above answers are "NO", then may proceed with Cephalosporin use.    Codeine Nausea And Vomiting   Prednisone Other (See Comments)   Simvastatin Other (See Comments)    "did something to muscles"   Sulfa Antibiotics Other (See Comments)    Reaction: unknown   Sulfamethoxazole-Trimethoprim Other (See Comments) and Nausea And Vomiting    Reaction: unknown   Levofloxacin Rash   Tramadol Itching and Rash    Current Outpatient Medications  Medication Sig Dispense Refill   allopurinol (ZYLOPRIM) 100 MG tablet Take 100 mg by mouth daily.     azelastine (ASTELIN) 0.1 % nasal spray Place 1 spray into both nostrils 2 (two) times daily as needed for allergies. Use in each nostril as directed     Cholecalciferol (VITAMIN D) 50 MCG (2000 UT) CAPS Take 4,000 Units by mouth daily.     dorzolamide-timolol (COSOPT) 22.3-6.8 MG/ML ophthalmic solution Place 1 drop into both eyes 2 (two) times daily.     ezetimibe (ZETIA) 10 MG tablet Take 1 tablet (10 mg) by mouth once daily     fluticasone (FLONASE) 50 MCG/ACT nasal spray Place 2 sprays into both nostrils at bedtime as needed for allergies.      levothyroxine (SYNTHROID, LEVOTHROID) 25 MCG tablet Take 25 mcg by mouth daily before breakfast.      LUMIGAN 0.01 % SOLN Place 1 drop into the left eye at bedtime.     metoprolol succinate (TOPROL-XL) 25 MG 24 hr tablet Take 1 tablet (25 mg total) by mouth daily. 90 tablet 3   omeprazole (PRILOSEC) 40 MG capsule Take 40 mg by mouth daily as needed (acid reflux).     Rivaroxaban (XARELTO) 15 MG TABS tablet TAKE 1 TABLET BY MOUTH EVERY DAY WITH SUPPER 90 tablet 1   rosuvastatin (CRESTOR) 5 MG tablet Take 1 tablet (5 mg total) by mouth daily.     Trospium Chloride 60 MG CP24 Take 1 capsule (60 mg total) by mouth daily. 90 capsule 0   VYZULTA 0.024 % SOLN Place 1 drop into the right eye at  bedtime.     No current facility-administered medications for this visit.      REVIEW OF SYSTEMS (Negative unless checked)  Constitutional: [x] Weight loss  [] Fever  [] Chills Cardiac: [] Chest pain   [] Chest pressure   [] Palpitations   [] Shortness of breath when laying flat   [] Shortness of breath at rest   [] Shortness of breath with exertion. Vascular:  [] Pain in legs with walking   [] Pain in legs at rest   [] Pain in legs when laying flat   [] Claudication   [] Pain in feet when walking  [] Pain in feet at  rest  [] Pain in feet when laying flat   [] History of DVT   [] Phlebitis   [] Swelling in legs   [] Varicose veins   [] Non-healing ulcers Pulmonary:   [] Uses home oxygen   [] Productive cough   [] Hemoptysis   [] Wheeze  [] COPD   [] Asthma Neurologic:  [] Dizziness  [] Blackouts   [] Seizures   [] History of stroke   [] History of TIA  [] Aphasia   [] Temporary blindness   [] Dysphagia   [] Weakness or numbness in arms   [x] Weakness or numbness in legs Musculoskeletal:  [] Arthritis   [] Joint swelling   [] Joint pain   [] Low back pain Hematologic:  [] Easy bruising  [] Easy bleeding   [] Hypercoagulable state   [] Anemic  [] Hepatitis Gastrointestinal:  [] Blood in stool   [] Vomiting blood  [] Gastroesophageal reflux/heartburn   [x] Abdominal pain Genitourinary:  [] Chronic kidney disease   [] Difficult urination  [] Frequent urination  [] Burning with urination   [] Hematuria Skin:  [] Rashes   [] Ulcers   [] Wounds Psychological:  [] History of anxiety   []  History of major depression.    Physical Exam BP 135/74 (BP Location: Left Arm)   Pulse 65   Resp 16   Wt 122 lb 6.4 oz (55.5 kg)   BMI 21.68 kg/m  Gen:  WD/WN, NAD. Appears far younger than stated age. Head: [place]/AT, No temporalis wasting.  Ear/Nose/Throat: Hearing grossly intact, nares w/o erythema or drainage, oropharynx w/o Erythema/Exudate Eyes: Conjunctiva clear, sclera non-icteric  Neck: trachea midline.  No JVD.  Pulmonary:  Good air movement, respirations  not labored, no use of accessory muscles  Cardiac: Somewhat irregular Vascular:  Vessel Right Left  Radial Palpable Palpable                          DP Palpable Palpable  PT Palpable Palpable   Gastrointestinal:. No masses, surgical incisions, or scars. Musculoskeletal: M/S 5/5 throughout.  Extremities without ischemic changes.  No deformity or atrophy.  Diffuse varicosities bilaterally.  No significant lower extremity edema. Neurologic: Sensation grossly intact in extremities.  Symmetrical.  Speech is fluent. Motor exam as listed above. Psychiatric: Judgment intact, Mood & affect appropriate for pt's clinical situation. Dermatologic: No rashes or ulcers noted.  No cellulitis or open wounds.    Radiology No results found.  Labs No results found for this or any previous visit (from the past 2160 hour(s)).  Assessment/Plan: Atrial fibrillation On anticoagulation   Hyperlipidemia lipid control important in reducing the progression of atherosclerotic disease. Continue statin therapy  Pain in limb Noninvasive studies were performed today showing a right ABI of 1.18 and a left ABI of 1.12 with triphasic waveforms and normal digital pressures bilaterally consistent with no significant arterial disease of the lower extremities.   She has a history of previous back surgery and this appears to be neurogenic symptoms at this point.  She is going to follow-up with her neurosurgeon in [place].  I will see her back as needed.      Festus Barren 01/27/2023, 9:49 AM   This note was created with Dragon medical transcription system.  Any errors from dictation are unintentional.

## 2023-02-07 ENCOUNTER — Other Ambulatory Visit: Payer: Self-pay | Admitting: Cardiovascular Disease

## 2023-02-07 DIAGNOSIS — I480 Paroxysmal atrial fibrillation: Secondary | ICD-10-CM

## 2023-02-07 NOTE — Telephone Encounter (Signed)
Prescription refill request for Xarelto received.  Indication:afib Last office visit:12/23 Weight:55.5  kg Age:88 Scr:0.9  4/24 CrCl:39.31  ml/min  Prescription refilled

## 2023-02-07 NOTE — Telephone Encounter (Signed)
Refill request

## 2023-02-20 ENCOUNTER — Other Ambulatory Visit: Payer: Self-pay | Admitting: Nurse Practitioner

## 2023-04-14 ENCOUNTER — Telehealth: Payer: Self-pay | Admitting: Cardiovascular Disease

## 2023-04-14 NOTE — Telephone Encounter (Signed)
Pt states her bp has been elevated. She states she takes her bp and blood thinner meds at night and wants to know if she can take another right now.    156/80 - last night  148/80 - right now

## 2023-04-14 NOTE — Telephone Encounter (Signed)
The patient called to report that she has been experiencing back pain and has noticed an increase in her blood pressure (reading listed below). She stated that her blood pressure typically runs in the 110s. She reports being evaluated yesterday at urgent care, where she was prescribed a muscle relaxer. She noted that the pain eased up last night but resumed after moving around today. The patient expressed concern about the elevated blood pressure, as she has a history of a previous TIA  156/80 last night  148/80 today  The nurse informed the patient that pain can increase blood pressure and recommended keeping a log, as the muscle relaxer may help alleviate the pain, which could in turn help lower the blood pressure. The nurse also told the patient that we will send a message to Dr. Hebert Soho for further evaluation.

## 2023-04-17 ENCOUNTER — Other Ambulatory Visit: Payer: Self-pay | Admitting: Internal Medicine

## 2023-04-17 ENCOUNTER — Other Ambulatory Visit: Payer: Medicare Other

## 2023-04-17 DIAGNOSIS — R1011 Right upper quadrant pain: Secondary | ICD-10-CM

## 2023-04-17 NOTE — Telephone Encounter (Signed)
Patient is returning call.  °

## 2023-04-17 NOTE — Telephone Encounter (Signed)
Called patient and notified her of the following from Dr. Mariah Milling.  Need to check blood pressure once pain is under control Over doing blood pressure medication could drop her out Thx TGollan    Patient verbalizes understanding.

## 2023-04-17 NOTE — Telephone Encounter (Signed)
Called patient and left message for call back.

## 2023-04-18 ENCOUNTER — Ambulatory Visit
Admission: RE | Admit: 2023-04-18 | Discharge: 2023-04-18 | Disposition: A | Discharge status: Home / Self Care | Payer: Medicare Other | Source: Ambulatory Visit | Attending: Internal Medicine | Admitting: Internal Medicine

## 2023-04-18 DIAGNOSIS — R1011 Right upper quadrant pain: Secondary | ICD-10-CM | POA: Diagnosis present

## 2023-04-20 ENCOUNTER — Other Ambulatory Visit: Payer: Self-pay | Admitting: Internal Medicine

## 2023-04-20 ENCOUNTER — Ambulatory Visit
Admission: RE | Admit: 2023-04-20 | Discharge: 2023-04-20 | Disposition: A | Discharge status: Home / Self Care | Payer: Medicare Other | Source: Ambulatory Visit | Attending: Internal Medicine | Admitting: Internal Medicine

## 2023-04-20 DIAGNOSIS — R1011 Right upper quadrant pain: Secondary | ICD-10-CM

## 2023-04-20 MED ORDER — IOHEXOL 300 MG/ML  SOLN
100.0000 mL | Freq: Once | INTRAMUSCULAR | Status: AC | PRN
  Administered 2023-04-20: 100 mL via INTRAVENOUS

## 2023-05-03 ENCOUNTER — Other Ambulatory Visit: Payer: Self-pay | Admitting: Student

## 2023-05-03 DIAGNOSIS — M546 Pain in thoracic spine: Secondary | ICD-10-CM

## 2023-05-16 ENCOUNTER — Ambulatory Visit
Admission: RE | Admit: 2023-05-16 | Discharge: 2023-05-16 | Disposition: A | Discharge status: Home / Self Care | Payer: Medicare Other | Source: Ambulatory Visit | Attending: Student | Admitting: Student

## 2023-05-16 DIAGNOSIS — M546 Pain in thoracic spine: Secondary | ICD-10-CM

## 2023-06-12 ENCOUNTER — Other Ambulatory Visit: Payer: Self-pay | Admitting: Neurosurgery

## 2023-06-12 DIAGNOSIS — M47812 Spondylosis without myelopathy or radiculopathy, cervical region: Secondary | ICD-10-CM

## 2023-06-16 ENCOUNTER — Ambulatory Visit
Admission: RE | Admit: 2023-06-16 | Discharge: 2023-06-16 | Disposition: A | Discharge status: Home / Self Care | Payer: Medicare Other | Source: Ambulatory Visit | Attending: Neurosurgery | Admitting: Neurosurgery

## 2023-06-16 DIAGNOSIS — M47812 Spondylosis without myelopathy or radiculopathy, cervical region: Secondary | ICD-10-CM

## 2023-08-14 ENCOUNTER — Telehealth: Payer: Self-pay | Admitting: Cardiovascular Disease

## 2023-08-14 MED ORDER — METOPROLOL SUCCINATE ER 25 MG PO TB24: 25.0000 mg | ORAL_TABLET | Freq: Every day | ORAL | 0 refills | Status: DC

## 2023-08-14 NOTE — Telephone Encounter (Signed)
*  STAT* If patient is at the pharmacy, call can be transferred to refill team.   1. Which medications need to be refilled? (please list name of each medication and dose if known) metoprolol succinate (TOPROL-XL) 25 MG 24 hr tablet   2. Which pharmacy/location (including street and city if local pharmacy) is medication to be sent to?  [id number] - [place],  - [address]    3. Do they need a 30 day or 90 day supply? 90   Patient has lost her medication and needs medication

## 2023-08-20 NOTE — Progress Notes (Unsigned)
 Cardiology Office Note  Date:  08/21/2023   ID:  Jacqueline, Powers [birth year], MRN [id number]  PCP:  Gracelyn Nurse, MD   Chief Complaint  Patient presents with   12 month follow up     Patient c/o frequent headaches, chest tightness at times, shortness of breath with exertion and fatigue/tiredness; patient is getting over a bronchial bacterial infection.     HPI:  Jacqueline Powers is a pleasant 89 year old woman with a history of  hyperlipidemia,  Hashimoto's thyroiditis with elevated TSH, osteoporosis,  remote history of chest pain,  GERD,  previously evaluated for jaw pain, neck pain, tachycardia CT coronary calcium score (score of 17) in 2017 history of neck  surgery, fusion 30 day monitor documented atrial fibrillation.  Prior TIA She presents today for follow-up of her atrial fibrillation   Last seen by myself in clinic 12/23 Getting over bronchial lung infection, chronic cough nocturia  Appreciating chest tightness at times, shortness of breath on exertion, fatigue, tiredness  Rare palpitations concerning for afib, Rare episodes of jaw pain, typically night time Once a month on average Will resolve without intervention  Otherwise active at baseline  Inconsistent with her Zetia  Lab work reviewed TSH 1.9 Total cholesterol 202 LDL 125 Down from cholesterol 297  EKG personally reviewed by myself on todays visit EKG Interpretation Date/Time:  Monday August 21 2023 10:09:02 EDT Ventricular Rate:  71 PR Interval:  134 QRS Duration:  64 QT Interval:  392 QTC Calculation: 425 R Axis:   33  Text Interpretation: Normal sinus rhythm Low voltage QRS When compared with ECG of 26-Mar-2016 01:27, No significant change was found Confirmed by Julien Nordmann [phone]) on 08/21/2023 10:28:46 AM    Other past medical history reviewed Prior back MRI Acute or subacute compression fractures at T12 and L1 with mild loss of height and no significant endplate retropulsion.    Multilevel degenerative changes as detailed above. Most notably, right foraminal disc protrusion at L4-L5 with compression of exiting L4 nerve root.  ABD u/s , Minimal atherosclerosis in the abdominal aorta.  Several CT scans abdomen review on today's visit dating back 2018, 2019, 2020 documenting moderate stenosis of mesenteric vessels  visit in the emergency room 03/26/16: atrial fib rate 150 bpm She was sleeping when she was woken up by severe tachycardia EKG showing atrial fibrillation, converted to NSR w/o intervention  PreviousCT scan imaging reviewed with her showing mild coronary calcification, mild descending aortic calcification     PMH:   has a past medical history of Arthritis, Asthma, Carpal tunnel syndrome, Colonic polyp, Complication of anesthesia, Degenerative joint disease of cervical and lumbar spine, Dysrhythmia, GERD (gastroesophageal reflux disease), Glaucoma, Hashimoto's thyroiditis, Hiatal hernia, History of depression, Hyperlipidemia, Hypertension, Hypothyroidism, Irritable bowel syndrome, Osteoporosis, PAF (paroxysmal atrial fibrillation) (HCC), Perennial allergic rhinitis, Right knee meniscal tear, and TIA (transient ischemic attack).  PSH:    Past Surgical History:  Procedure Laterality Date   BACK SURGERY     L5   CATARACT EXTRACTION     CERVICAL CONE BIOPSY     CERVICAL FUSION     COLONOSCOPY     COLONOSCOPY WITH PROPOFOL N/A 01/20/2017   Procedure: COLONOSCOPY WITH PROPOFOL;  Surgeon: Scot Jun, MD;  Location: [facility] ENDOSCOPY;  Service: Endoscopy;  Laterality: N/A;   COLONOSCOPY WITH PROPOFOL N/A 07/21/2020   Procedure: COLONOSCOPY WITH PROPOFOL;  Surgeon: Regis Bill, MD;  Location: [facility] ENDOSCOPY;  Service: Endoscopy;  Laterality: N/A;   ESOPHAGOGASTRODUODENOSCOPY N/A 07/21/2020  Procedure: ESOPHAGOGASTRODUODENOSCOPY (EGD);  Surgeon: Regis Bill, MD;  Location: [facility] ENDOSCOPY;  Service: Endoscopy;  Laterality: N/A;    ESOPHAGOGASTRODUODENOSCOPY (EGD) WITH PROPOFOL N/A 01/20/2017   Procedure: ESOPHAGOGASTRODUODENOSCOPY (EGD) WITH PROPOFOL;  Surgeon: Scot Jun, MD;  Location: [facility] ENDOSCOPY;  Service: Endoscopy;  Laterality: N/A;   ESOPHAGOGASTRODUODENOSCOPY (EGD) WITH PROPOFOL N/A 04/28/2017   Procedure: ESOPHAGOGASTRODUODENOSCOPY (EGD) WITH PROPOFOL;  Surgeon: Scot Jun, MD;  Location: [facility] ENDOSCOPY;  Service: Endoscopy;  Laterality: N/A;   KYPHOPLASTY N/A 04/08/2021   Procedure: KYPHOPLASTY T12, L1;  Surgeon: Tressie Stalker, MD;  Location: [facility] OR;  Service: Neurosurgery;  Laterality: N/A;   TONSILLECTOMY      Current Outpatient Medications  Medication Sig Dispense Refill   azelastine (ASTELIN) 0.1 % nasal spray Place 1 spray into both nostrils 2 (two) times daily as needed for allergies. Use in each nostril as directed     Cholecalciferol (VITAMIN D) 50 MCG (2000 UT) CAPS Take 4,000 Units by mouth daily.     dorzolamide-timolol (COSOPT) 22.3-6.8 MG/ML ophthalmic solution Place 1 drop into both eyes 2 (two) times daily.     fluticasone (FLONASE) 50 MCG/ACT nasal spray Place 2 sprays into both nostrils at bedtime as needed for allergies.      HYDROcodone-acetaminophen (NORCO/VICODIN) 5-325 MG tablet Take 1-2 tablets by mouth every 4 (four) hours as needed.     levothyroxine (SYNTHROID, LEVOTHROID) 25 MCG tablet Take 25 mcg by mouth daily before breakfast.      LUMIGAN 0.01 % SOLN Place 1 drop into the left eye at bedtime.     metoprolol tartrate (LOPRESSOR) 50 MG tablet Take 1 tablet (50 mg total) by mouth 2 (two) times daily as needed. 180 tablet 1   pantoprazole (PROTONIX) 40 MG tablet Take 40 mg by mouth daily.     VYZULTA 0.024 % SOLN Place 1 drop into the right eye at bedtime.     ezetimibe (ZETIA) 10 MG tablet Take 1 tablet (10 mg total) by mouth daily. Take 1 tablet (10 mg) by mouth once daily as needed 90 tablet 3   metoprolol succinate (TOPROL-XL) 25 MG 24 hr tablet Take 1 tablet  (25 mg total) by mouth daily. 90 tablet 3   Rivaroxaban (XARELTO) 15 MG TABS tablet Take 1 tablet (15 mg total) by mouth daily with supper. 90 tablet 1   Trospium Chloride 60 MG CP24 Take 1 capsule (60 mg total) by mouth daily. (Patient not taking: Reported on 08/21/2023) 90 capsule 0   No current facility-administered medications for this visit.     Allergies:   Amoxicillin-pot clavulanate, Codeine, Prednisone, Simvastatin, Sulfa antibiotics, Sulfamethoxazole-trimethoprim, Levofloxacin, and Tramadol   Social History:  The patient  reports that she has never smoked. She has never used smokeless tobacco. She reports current alcohol use. She reports that she does not use drugs.   Family History:   family history includes Hypertension in her sister.    Review of Systems: Review of Systems  Constitutional: Negative.   HENT: Negative.    Respiratory: Negative.    Cardiovascular: Negative.   Gastrointestinal: Negative.   Musculoskeletal: Negative.   Neurological: Negative.   Psychiatric/Behavioral: Negative.    All other systems reviewed and are negative.  PHYSICAL EXAM: VS:  BP (!) 140/80 (BP Location: Left Arm, Patient Position: Sitting, Cuff Size: Normal)   Pulse 71   Ht 5\' 3"  (1.6 m)   Wt 121 lb (54.9 kg)   SpO2 98%   BMI 21.43 kg/m  ,  BMI Body mass index is 21.43 kg/m. Constitutional:  oriented to person, place, and time. No distress.  HENT:  Head: Grossly normal Eyes:  no discharge. No scleral icterus.  Neck: No JVD, no carotid bruits  Cardiovascular: Regular rate and rhythm, no murmurs appreciated Pulmonary/Chest: Clear to auscultation bilaterally, no wheezes or rails Abdominal: Soft.  no distension.  no tenderness.  Musculoskeletal: Normal range of motion Neurological:  normal muscle tone. Coordination normal. No atrophy Skin: Skin warm and dry Psychiatric: normal affect, pleasant  Recent Labs: No results found for requested labs within last 365 days.    Lipid  Panel Lab Results  Component Value Date   CHOL 140 09/08/2016   HDL 62 09/08/2016   LDLCALC 64 09/08/2016   TRIG 71 09/08/2016     Wt Readings from Last 3 Encounters:  08/21/23 121 lb (54.9 kg)  01/27/23 122 lb 6.4 oz (55.5 kg)  05/03/22 122 lb (55.3 kg)     ASSESSMENT AND PLAN:  Paroxysmal atrial fibrillation (HCC) - maintaining normal sinus rhythm, stable, rare palpitations Tolerating anticoagulation on xarelto 15 mg daily for low creatinine clearance Recommend she continue metoprolol succinate 25 daily Suggest she take metoprolol tartrate 50 mg for paroxysmal A-fib, possibly contributing to her jaw pain episodes at nighttime  Other specified transient cerebral ischemias On anticoagulation, Prior TIAs No recent episodes  Hyperlipidemia/peripheral arterial disease Inconsistent with zetia and Crestor Without medications total cholesterol up to 300 Recommend she restart  PAD mesenteric disease,  Stressed importance of staying on Crestor Zetia combo  GI discomfort Pancreatic issues in the past  Chest pain, unspecified chest pain type Currently with no symptoms of angina. No further workup at this time. Continue current medication regimen.  Encounter for anticoagulation discussion and counseling Tolerating Xarelto 15 mg daily    Total encounter time more than 30 minutes  Greater than 50% was spent in counseling and coordination of care with the patient     Orders Placed This Encounter  Procedures   EKG 12-Lead   ECHOCARDIOGRAM COMPLETE     Signed, Dossie Arbour, M.D., Ph.D. 08/21/2023  [facility] [place], [place] [phone]

## 2023-08-21 ENCOUNTER — Encounter: Payer: Self-pay | Admitting: Cardiovascular Disease

## 2023-08-21 ENCOUNTER — Ambulatory Visit: Attending: Cardiovascular Disease | Admitting: Cardiovascular Disease

## 2023-08-21 VITALS — BP 140/80 | HR 71 | Ht 63.0 in | Wt 121.0 lb

## 2023-08-21 DIAGNOSIS — I6523 Occlusion and stenosis of bilateral carotid arteries: Secondary | ICD-10-CM

## 2023-08-21 DIAGNOSIS — I480 Paroxysmal atrial fibrillation: Secondary | ICD-10-CM | POA: Diagnosis not present

## 2023-08-21 DIAGNOSIS — G459 Transient cerebral ischemic attack, unspecified: Secondary | ICD-10-CM

## 2023-08-21 DIAGNOSIS — E782 Mixed hyperlipidemia: Secondary | ICD-10-CM | POA: Diagnosis not present

## 2023-08-21 DIAGNOSIS — J4530 Mild persistent asthma, uncomplicated: Secondary | ICD-10-CM

## 2023-08-21 DIAGNOSIS — R0789 Other chest pain: Secondary | ICD-10-CM | POA: Diagnosis not present

## 2023-08-21 DIAGNOSIS — K551 Chronic vascular disorders of intestine: Secondary | ICD-10-CM

## 2023-08-21 DIAGNOSIS — I700 Atherosclerosis of aorta: Secondary | ICD-10-CM

## 2023-08-21 MED ORDER — EZETIMIBE 10 MG PO TABS: 10.0000 mg | ORAL_TABLET | Freq: Every day | ORAL | 3 refills | Status: AC

## 2023-08-21 MED ORDER — EZETIMIBE 10 MG PO TABS: 10.0000 mg | ORAL_TABLET | Freq: Every day | ORAL | 3 refills | Status: DC

## 2023-08-21 MED ORDER — METOPROLOL SUCCINATE ER 25 MG PO TB24: 25.0000 mg | ORAL_TABLET | Freq: Every day | ORAL | 3 refills | Status: DC

## 2023-08-21 MED ORDER — RIVAROXABAN 15 MG PO TABS: 15.0000 mg | ORAL_TABLET | Freq: Every day | ORAL | 1 refills | Status: DC

## 2023-08-21 MED ORDER — METOPROLOL TARTRATE 50 MG PO TABS: 50.0000 mg | ORAL_TABLET | Freq: Two times a day (BID) | ORAL | 1 refills | Status: AC | PRN

## 2023-08-21 NOTE — Patient Instructions (Addendum)
 Buy pulse oximeter  Medication Instructions:  Metoprolol tartrate 50 mg twice a day as needed for rapid rate  If you need a refill on your cardiac medications before your next appointment, please call your pharmacy.   Lab work: No new labs needed  Testing/Procedures: Your physician has requested that you have an echocardiogram. Echocardiography is a painless test that uses sound waves to create images of your heart. It provides your doctor with information about the size and shape of your heart and how well your heart's chambers and valves are working.   You may receive an ultrasound enhancing agent through an IV if needed to better visualize your heart during the echo. This procedure takes approximately one hour.  There are no restrictions for this procedure.  This will take place at [address] [facility] [address], [place] [address]  Please note: We ask at that you not bring children with you during ultrasound (echo/ vascular) testing. Due to room size and safety concerns, children are not allowed in the ultrasound rooms during exams. Our front office staff cannot provide observation of children in our lobby area while testing is being conducted. An adult accompanying a patient to their appointment will only be allowed in the ultrasound room at the discretion of the ultrasound technician under special circumstances. We apologize for any inconvenience.   Follow-Up: At [facility], you and your health needs are our priority.  As part of our continuing mission to provide you with exceptional heart care, we have created designated Provider Care Teams.  These Care Teams include your primary Cardiologist (physician) and Advanced Practice Providers (APPs -  Physician Assistants and Nurse Practitioners) who all work together to provide you with the care you need, when you need it.  You will need a follow up appointment in 12 months  Providers on your designated Care Team:    Nicolasa Ducking, NP Eula Listen, PA-C Cadence Fransico Michael, [place]  COVID-19 Vaccine Information can be found at: [email or web address] For questions related to vaccine distribution or appointments, please email [email or web address] or call [phone].

## 2023-09-21 ENCOUNTER — Ambulatory Visit: Attending: Cardiovascular Disease

## 2023-09-21 DIAGNOSIS — I480 Paroxysmal atrial fibrillation: Secondary | ICD-10-CM | POA: Diagnosis not present

## 2023-09-21 LAB — ECHOCARDIOGRAM COMPLETE
AR max vel: 2.41 cm2
AV Area VTI: 2.57 cm2
AV Area mean vel: 2.29 cm2
AV Mean grad: 3 mmHg
AV Peak grad: 5 mmHg
Ao pk vel: 1.12 m/s
Area-P 1/2: 3.21 cm2
S' Lateral: 2.06 cm

## 2023-09-22 ENCOUNTER — Encounter: Payer: Self-pay | Admitting: Cardiovascular Disease

## 2023-10-10 ENCOUNTER — Ambulatory Visit: Admitting: Cardiovascular Disease

## 2023-10-17 ENCOUNTER — Encounter (INDEPENDENT_AMBULATORY_CARE_PROVIDER_SITE_OTHER): Payer: Self-pay

## 2023-11-23 ENCOUNTER — Telehealth: Payer: Self-pay | Admitting: Cardiology

## 2023-11-23 NOTE — Telephone Encounter (Signed)
 Outpatient service line:   Paged for elevated BP. No answer x2. Left voicemail.

## 2023-11-25 ENCOUNTER — Telehealth: Payer: Self-pay | Admitting: Cardiology

## 2023-11-25 NOTE — Telephone Encounter (Signed)
 Paged again for elevated BP. No answer, left voicemail.

## 2023-11-25 NOTE — Telephone Encounter (Signed)
 Outpatient service line:  Patient reporting hypertension.  She has generally very well-controlled blood pressure with readings in the 130s.  She has noticed with the weather change that she has had elevated readings in the 180s but will return back to normal.  She says she has been evaluated for this at a walk-in clinic recently.  She demonstrates no red flag symptoms such as visual disturbances, light sensitivity, strokelike symptoms.  Suspect that she is likely having migraines and during acute episode having elevated readings due to pain.  She has been given ER precautions and advised to go to the emergency room should she have worsening symptoms otherwise seems to have well-controlled blood pressure.

## 2024-02-07 ENCOUNTER — Encounter: Payer: Self-pay | Admitting: *Deleted

## 2024-02-22 ENCOUNTER — Telehealth: Payer: Self-pay | Admitting: Cardiovascular Disease

## 2024-02-22 NOTE — Telephone Encounter (Signed)
 Spoke with patient.  Explained that I believed that her message was sent to the wrong clinic.  She stated, No, Dr. Gollan is my doctor and I thought that someone in his office could tell me which medications to hold for my colonoscopy/endoscopy.  I explained she needed to call Dr. Ole Schick at [facility] GI department.  She stated that she had just got off the phone with them and she had found out which meds to hold. She thanked me for the return call and I wished her well on her upcoming procedure.

## 2024-02-22 NOTE — Telephone Encounter (Signed)
 Patient want to know what medication she should hold for her procedure on Monday. Advise patient there is no clearance on file to determine if she is cleared. Patient is asking the nurse to give her a call. Please advise

## 2024-02-23 ENCOUNTER — Telehealth: Payer: Self-pay | Admitting: Cardiovascular Disease

## 2024-02-23 ENCOUNTER — Other Ambulatory Visit: Payer: Self-pay

## 2024-02-23 MED ORDER — METOPROLOL SUCCINATE ER 25 MG PO TB24: 25.0000 mg | ORAL_TABLET | Freq: Every day | ORAL | 0 refills | Status: AC

## 2024-02-23 NOTE — Telephone Encounter (Signed)
*  STAT* If patient is at the pharmacy, call can be transferred to refill team.   1. Which medications need to be refilled? (please list name of each medication and dose if known)   metoprolol  succinate (TOPROL -XL) 25 MG 24 hr tablet   2. Would you like to learn more about the convenience, safety, & potential cost savings by using the [facility]?   3. Are you open to using the [facility] (Type [facility]. ).  4. Which pharmacy/location (including street and city if local pharmacy) is medication to be sent to?  [id number] - [place], [place] - [address]   5. Do they need a 30 day or 90 day supply?   90 day  Patient stated she is completely out of this medication.

## 2024-02-26 ENCOUNTER — Ambulatory Visit: Admitting: Certified Registered Nurse Anesthetist

## 2024-02-26 ENCOUNTER — Encounter
Admission: RE | Disposition: A | Discharge status: Home / Self Care | Payer: Self-pay | Source: Home / Self Care | Attending: Gastroenterology

## 2024-02-26 ENCOUNTER — Other Ambulatory Visit: Payer: Self-pay

## 2024-02-26 ENCOUNTER — Ambulatory Visit
Admission: RE | Admit: 2024-02-26 | Discharge: 2024-02-26 | Disposition: A | Discharge status: Home / Self Care | Attending: Gastroenterology | Admitting: Gastroenterology

## 2024-02-26 DIAGNOSIS — R1084 Generalized abdominal pain: Secondary | ICD-10-CM | POA: Insufficient documentation

## 2024-02-26 DIAGNOSIS — I10 Essential (primary) hypertension: Secondary | ICD-10-CM | POA: Diagnosis not present

## 2024-02-26 DIAGNOSIS — Q438 Other specified congenital malformations of intestine: Secondary | ICD-10-CM | POA: Diagnosis not present

## 2024-02-26 DIAGNOSIS — K219 Gastro-esophageal reflux disease without esophagitis: Secondary | ICD-10-CM | POA: Diagnosis not present

## 2024-02-26 DIAGNOSIS — K921 Melena: Secondary | ICD-10-CM | POA: Insufficient documentation

## 2024-02-26 DIAGNOSIS — K449 Diaphragmatic hernia without obstruction or gangrene: Secondary | ICD-10-CM | POA: Diagnosis not present

## 2024-02-26 DIAGNOSIS — Z7901 Long term (current) use of anticoagulants: Secondary | ICD-10-CM | POA: Diagnosis not present

## 2024-02-26 DIAGNOSIS — K581 Irritable bowel syndrome with constipation: Secondary | ICD-10-CM | POA: Insufficient documentation

## 2024-02-26 DIAGNOSIS — E063 Autoimmune thyroiditis: Secondary | ICD-10-CM | POA: Diagnosis not present

## 2024-02-26 DIAGNOSIS — I480 Paroxysmal atrial fibrillation: Secondary | ICD-10-CM | POA: Insufficient documentation

## 2024-02-26 HISTORY — DX: Cerebral infarction, unspecified: I63.9

## 2024-02-26 HISTORY — DX: Atherosclerotic heart disease of native coronary artery without angina pectoris: I25.10

## 2024-02-26 HISTORY — DX: Unspecified macular degeneration: H35.30

## 2024-02-26 HISTORY — DX: Other specified diseases of pancreas: K86.89

## 2024-02-26 HISTORY — DX: Chronic atrial fibrillation, unspecified: I48.20

## 2024-02-26 HISTORY — DX: Atherosclerosis of aorta: I70.0

## 2024-02-26 HISTORY — PX: COLONOSCOPY: SHX5424

## 2024-02-26 HISTORY — PX: ESOPHAGOGASTRODUODENOSCOPY: SHX5428

## 2024-02-26 HISTORY — DX: Cerebrovascular disease, unspecified: I67.9

## 2024-02-26 HISTORY — DX: Autoimmune thyroiditis: E06.3

## 2024-02-26 SURGERY — COLONOSCOPY
Anesthesia: General

## 2024-02-26 MED ORDER — LIDOCAINE HCL (CARDIAC) PF 100 MG/5ML IV SOSY
PREFILLED_SYRINGE | INTRAVENOUS | Status: DC | PRN
  Administered 2024-02-26: 60 mg via INTRAVENOUS

## 2024-02-26 MED ORDER — SODIUM CHLORIDE 0.9 % IV SOLN: INTRAVENOUS | Status: DC

## 2024-02-26 MED ORDER — PROPOFOL 500 MG/50ML IV EMUL
INTRAVENOUS | Status: DC | PRN
  Administered 2024-02-26: 130 ug/kg/min via INTRAVENOUS

## 2024-02-26 MED ORDER — PROPOFOL 10 MG/ML IV BOLUS
INTRAVENOUS | Status: DC | PRN
  Administered 2024-02-26: 40 mg via INTRAVENOUS

## 2024-02-26 NOTE — Transfer of Care (Signed)
 Immediate Anesthesia Transfer of Care Note  Patient: Jacqueline Powers  Procedure(s) Performed: COLONOSCOPY EGD (ESOPHAGOGASTRODUODENOSCOPY)  Patient Location: PACU  Anesthesia Type:General  Level of Consciousness: drowsy  Airway & Oxygen Therapy: Patient Spontanous Breathing  Post-op Assessment: Report given to RN and Post -op Vital signs reviewed and stable  Post vital signs: Reviewed and stable  Last Vitals:  Vitals Value Taken Time  BP    Temp    Pulse    Resp    SpO2      Last Pain:  Vitals:   02/26/24 1134  TempSrc: Temporal  PainSc: 0-No pain         Complications: No notable events documented.

## 2024-02-26 NOTE — Op Note (Signed)
 [facility]astro[facility] Patient Name: Jacqueline Powers Procedure Date: 02/26/2024 11:58 AM MRN: [id number] Account #: [id number] Date of Birth: 16-Feb-1935 Admit Type: Outpatient Age: 89 Room: [facility] ENDO ROOM 3 Gender: Female Note Status: Finalized Instrument Name: Upper GI Scope [id number] Procedure:             Upper GI endoscopy Indications:           Generalized abdominal pain, Melena Providers:             Ole Schick MD, MD Referring MD:          Ole Schick MD, MD (Referring MD), Norleen CHARM Rower, MD (Referring MD) Medicines:             Monitored Anesthesia Care Complications:         No immediate complications. Procedure:             Pre-Anesthesia Assessment:                        - Prior to the procedure, a History and Physical was                         performed, and patient medications and allergies were                         reviewed. The patient is competent. The risks and                         benefits of the procedure and the sedation options and                         risks were discussed with the patient. All questions                         were answered and informed consent was obtained.                         Patient identification and proposed procedure were                         verified by the physician, the nurse, the                         anesthesiologist, the anesthetist and the technician                         in the endoscopy suite. Mental Status Examination:                         alert and oriented. Airway Examination: normal                         oropharyngeal airway and neck mobility. Respiratory                         Examination: clear to auscultation. CV Examination:  normal. Prophylactic Antibiotics: The patient does not                         require prophylactic antibiotics. Prior                         Anticoagulants: The patient has taken no  anticoagulant                         or antiplatelet agents. ASA Grade Assessment: III - A                         patient with severe systemic disease. After reviewing                         the risks and benefits, the patient was deemed in                         satisfactory condition to undergo the procedure. The                         anesthesia plan was to use monitored anesthesia care                         (MAC). Immediately prior to administration of                         medications, the patient was re-assessed for adequacy                         to receive sedatives. The heart rate, respiratory                         rate, oxygen saturations, blood pressure, adequacy of                         pulmonary ventilation, and response to care were                         monitored throughout the procedure. The physical                         status of the patient was re-assessed after the                         procedure.                        After obtaining informed consent, the endoscope was                         passed under direct vision. Throughout the procedure,                         the patient's blood pressure, pulse, and oxygen                         saturations were monitored continuously. The Endoscope  was introduced through the mouth, and advanced to the                         second part of duodenum. The upper GI endoscopy was                         accomplished without difficulty. The patient tolerated                         the procedure well. Findings:      A medium-sized hiatal hernia was present.      The exam of the esophagus was otherwise normal.      The entire examined stomach was normal.      The examined duodenum was normal. Impression:            - Medium-sized hiatal hernia.                        - Normal stomach.                        - Normal examined duodenum.                        - No specimens  collected. Recommendation:        - Discharge patient to home.                        - Resume previous diet.                        - Continue present medications.                        - Return to referring physician as previously                         scheduled. Procedure Code(s):     --- Professional ---                        [phone], Esophagogastroduodenoscopy, flexible,                         transoral; diagnostic, including collection of                         specimen(s) by brushing or washing, when performed                         (separate procedure) Diagnosis Code(s):     --- Professional ---                        K44.9, Diaphragmatic hernia without obstruction or                         gangrene                        R10.84, Generalized abdominal pain                        K92.1, Melena (  includes Hematochezia) CPT copyright 2022 American Medical Association. All rights reserved. The codes documented in this report are preliminary and upon coder review may  be revised to meet current compliance requirements. Ole Schick MD, MD 02/26/2024 12:35:12 PM Number of Addenda: 0 Note Initiated On: 02/26/2024 11:58 AM Estimated Blood Loss:  Estimated blood loss: none.      [facility]

## 2024-02-26 NOTE — Anesthesia Preprocedure Evaluation (Signed)
 Anesthesia Evaluation  Patient identified by MRN, date of birth, ID band Patient awake    Reviewed: Allergy & Precautions, NPO status , Patient's Chart, lab work & pertinent test results  Airway Mallampati: II  TM Distance: >3 FB Neck ROM: Full    Dental  (+) Teeth Intact   Pulmonary neg pulmonary ROS, asthma    Pulmonary exam normal        Cardiovascular Exercise Tolerance: Good hypertension, Pt. on medications + CAD  negative cardio ROS Normal cardiovascular exam+ dysrhythmias Atrial Fibrillation  Rhythm:Irregular     Neuro/Psych TIAnegative neurological ROS  negative psych ROS   GI/Hepatic negative GI ROS, Neg liver ROS, hiatal hernia,GERD  Medicated,,  Endo/Other  negative endocrine ROSHypothyroidism    Renal/GU negative Renal ROS  negative genitourinary   Musculoskeletal  (+) Arthritis ,    Abdominal Normal abdominal exam  (+)   Peds negative pediatric ROS (+)  Hematology negative hematology ROS (+)   Anesthesia Other Findings Past Medical History: No date: Aortic atherosclerosis No date: Arthritis No date: Asthma No date: Carpal tunnel syndrome No date: Cerebrovascular disease No date: Chronic atrial fibrillation (HCC) No date: Colonic polyp No date: Complication of anesthesia     Comment:  took me a long to wake up No date: Coronary artery disease No date: Degenerative joint disease of cervical and lumbar spine No date: Dysrhythmia No date: GERD (gastroesophageal reflux disease) No date: Glaucoma No date: Hashimoto's thyroiditis     Comment:  a. 05/2002 s/p resection of thyroid  nodule-->on               replacement. No date: Hiatal hernia No date: History of depression No date: Hyperlipidemia No date: Hypertension No date: Hypothyroidism No date: Hypothyroidism due to Hashimoto's thyroiditis No date: Irritable bowel syndrome No date: Macular degeneration, bilateral No date:  Osteoporosis No date: PAF (paroxysmal atrial fibrillation) (HCC)     Comment:  a. CHA2DS2VASc = 5-->xarelto ;   No date: Pancreatic insufficiency No date: Perennial allergic rhinitis No date: Right knee meniscal tear No date: Stroke [facility]) No date: TIA (transient ischemic attack)     Comment:  a. 06/2013: LUE/LLE wkns x 15 mins, MRI neg for CVA.  Past Surgical History: No date: BACK SURGERY     Comment:  L5 No date: CATARACT EXTRACTION No date: CERVICAL CONE BIOPSY No date: CERVICAL FUSION No date: COLONOSCOPY 01/20/2017: COLONOSCOPY WITH PROPOFOL ; N/A     Comment:  Procedure: COLONOSCOPY WITH PROPOFOL ;  Surgeon: Viktoria Lamar DASEN, MD;  Location: [facility] ENDOSCOPY;  Service:               Endoscopy;  Laterality: N/A; 07/21/2020: COLONOSCOPY WITH PROPOFOL ; N/A     Comment:  Procedure: COLONOSCOPY WITH PROPOFOL ;  Surgeon:               Maryruth Ole DASEN, MD;  Location: [facility] ENDOSCOPY;                Service: Endoscopy;  Laterality: N/A; No date: Cyst of lumbar spine; N/A No date: ENDOSCOPIC RETROGRADE CHOLANGIOPANCREATOGRAPHY (ERCP) WITH  PROPOFOL ; N/A 07/21/2020: ESOPHAGOGASTRODUODENOSCOPY; N/A     Comment:  Procedure: ESOPHAGOGASTRODUODENOSCOPY (EGD);  Surgeon:               Maryruth Ole DASEN, MD;  Location: [facility] ENDOSCOPY;                Service: Endoscopy;  Laterality: N/A; 01/20/2017:  ESOPHAGOGASTRODUODENOSCOPY (EGD) WITH PROPOFOL ; N/A     Comment:  Procedure: ESOPHAGOGASTRODUODENOSCOPY (EGD) WITH               PROPOFOL ;  Surgeon: Viktoria Lamar DASEN, MD;  Location:               [facility] ENDOSCOPY;  Service: Endoscopy;  Laterality: N/A; 04/28/2017: ESOPHAGOGASTRODUODENOSCOPY (EGD) WITH PROPOFOL ; N/A     Comment:  Procedure: ESOPHAGOGASTRODUODENOSCOPY (EGD) WITH               PROPOFOL ;  Surgeon: Viktoria Lamar DASEN, MD;  Location:               [facility] ENDOSCOPY;  Service: Endoscopy;  Laterality: N/A; No date: KNEE ARTHROSCOPY; N/A 04/08/2021: KYPHOPLASTY; N/A      Comment:  Procedure: KYPHOPLASTY T12, L1;  Surgeon: Mavis Purchase, MD;  Location: [facility] OR;  Service: Neurosurgery;                Laterality: N/A; No date: Resection of thyroid  nodule; N/A No date: TONSILLECTOMY  BMI    Body Mass Index: 20.62 kg/m      Reproductive/Obstetrics negative OB ROS                              Anesthesia Physical Anesthesia Plan  ASA: 3  Anesthesia Plan: General   Post-op Pain Management:    Induction: Intravenous  PONV Risk Score and Plan: Propofol  infusion and TIVA  Airway Management Planned: Natural Airway and Nasal Cannula  Additional Equipment:   Intra-op Plan:   Post-operative Plan:   Informed Consent: I have reviewed the patients History and Physical, chart, labs and discussed the procedure including the risks, benefits and alternatives for the proposed anesthesia with the patient or authorized representative who has indicated his/her understanding and acceptance.     Dental Advisory Given  Plan Discussed with: CRNA  Anesthesia Plan Comments:         Anesthesia Quick Evaluation

## 2024-02-26 NOTE — Op Note (Signed)
 [facility] Patient Name: Jacqueline Powers Procedure Date: 02/26/2024 11:56 AM MRN: [id number] Account #: [id number] Date of Birth: [birth year] Admit Type: Outpatient Age: 89 Room: [facility] ENDO ROOM 3 Gender: Female Note Status: Finalized Instrument Name: Colon Scope [id number] Procedure:             Colonoscopy Indications:           Generalized abdominal pain Providers:             Ole Schick MD, MD Referring MD:          Ole Schick MD, MD (Referring MD), Norleen CHARM Rower, MD (Referring MD) Medicines:             Monitored Anesthesia Care Complications:         No immediate complications. Procedure:             Pre-Anesthesia Assessment:                        - Prior to the procedure, a History and Physical was                         performed, and patient medications and allergies were                         reviewed. The patient is competent. The risks and                         benefits of the procedure and the sedation options and                         risks were discussed with the patient. All questions                         were answered and informed consent was obtained.                         Patient identification and proposed procedure were                         verified by the physician, the nurse, the                         anesthesiologist, the anesthetist and the technician                         in the endoscopy suite. Mental Status Examination:                         alert and oriented. Airway Examination: normal                         oropharyngeal airway and neck mobility. Respiratory                         Examination: clear to auscultation. CV Examination:  normal. Prophylactic Antibiotics: The patient does not                         require prophylactic antibiotics. Prior                         Anticoagulants: The patient has taken Xarelto                           (rivaroxaban ), last dose was 3 days prior to                         procedure. ASA Grade Assessment: III - A patient with                         severe systemic disease. After reviewing the risks and                         benefits, the patient was deemed in satisfactory                         condition to undergo the procedure. The anesthesia                         plan was to use monitored anesthesia care (MAC).                         Immediately prior to administration of medications,                         the patient was re-assessed for adequacy to receive                         sedatives. The heart rate, respiratory rate, oxygen                         saturations, blood pressure, adequacy of pulmonary                         ventilation, and response to care were monitored                         throughout the procedure. The physical status of the                         patient was re-assessed after the procedure.                        After obtaining informed consent, the colonoscope was                         passed under direct vision. Throughout the procedure,                         the patient's blood pressure, pulse, and oxygen                         saturations were monitored continuously. The  Colonoscope was introduced through the anus and                         advanced to the the terminal ileum, with                         identification of the appendiceal orifice and IC                         valve. The colonoscopy was somewhat difficult due to a                         redundant colon. The patient tolerated the procedure                         well. The quality of the bowel preparation was good.                         The terminal ileum, ileocecal valve, appendiceal                         orifice, and rectum were photographed. Findings:      The perianal and digital rectal examinations were normal.      The terminal ileum  appeared normal.      The entire examined colon appeared normal on direct and retroflexion       views. Impression:            - The examined portion of the ileum was normal.                        - The entire examined colon is normal on direct and                         retroflexion views.                        - No specimens collected. Recommendation:        - Discharge patient to home.                        - Resume previous diet.                        - Continue present medications.                        - Repeat colonoscopy is not recommended due to current                         age (53 years or older) for surveillance.                        - Return to referring physician as previously                         scheduled.                        - Resume Xarelto  (rivaroxaban ) at prior dose today. Procedure Code(s):     ---  Professional ---                        [phone], Colonoscopy, flexible; diagnostic, including                         collection of specimen(s) by brushing or washing, when                         performed (separate procedure) Diagnosis Code(s):     --- Professional ---                        R10.84, Generalized abdominal pain CPT copyright 2022 American Medical Association. All rights reserved. The codes documented in this report are preliminary and upon coder review may  be revised to meet current compliance requirements. Ole Schick MD, MD 02/26/2024 12:38:29 PM Number of Addenda: 0 Note Initiated On: 02/26/2024 11:56 AM Scope Withdrawal Time: 0 hours 6 minutes 29 seconds  Total Procedure Duration: 0 hours 12 minutes 3 seconds  Estimated Blood Loss:  Estimated blood loss: none.      [facility]

## 2024-02-26 NOTE — H&P (Signed)
 Outpatient short stay form Pre-procedure 02/26/2024  Jacqueline ONEIDA Schick, MD  Primary Physician: Rudolpho Norleen BIRCH, MD  Reason for visit:  Abdominal pain/Melena  History of present illness:    89 y/o lady with IBS-C, a. Fib on xarelto  with last dose 3 days ago, hypothyroidism, and hypertension here for EGD/Colonoscopy for abdominal pain/melena. Last colonoscopy in 2022 was normal. No significant abdominal surgeries.    Current Facility-Administered Medications:    0.9 %  sodium chloride  infusion, , Intravenous, Continuous, Ashira Kirsten, Jacqueline ONEIDA, MD  Medications Prior to Admission  Medication Sig Dispense Refill Last Dose/Taking   Cholecalciferol (VITAMIN D) 50 MCG (2000 UT) CAPS Take 4,000 Units by mouth daily.   Past Week   dorzolamide -timolol  (COSOPT ) 22.3-6.8 MG/ML ophthalmic solution Place 1 drop into both eyes 2 (two) times daily.   02/25/2024   fluticasone  (FLONASE ) 50 MCG/ACT nasal spray Place 2 sprays into both nostrils at bedtime as needed for allergies.    02/25/2024   levothyroxine  (SYNTHROID , LEVOTHROID) 25 MCG tablet Take 25 mcg by mouth daily before breakfast.    02/26/2024   metoprolol  succinate (TOPROL -XL) 25 MG 24 hr tablet Take 1 tablet (25 mg total) by mouth daily. 90 tablet 0 02/25/2024   azelastine  (ASTELIN ) 0.1 % nasal spray Place 1 spray into both nostrils 2 (two) times daily as needed for allergies. Use in each nostril as directed      ezetimibe  (ZETIA ) 10 MG tablet Take 1 tablet (10 mg total) by mouth daily. Take 1 tablet (10 mg) by mouth once daily as needed 90 tablet 3    HYDROcodone -acetaminophen  (NORCO/VICODIN) 5-325 MG tablet Take 1-2 tablets by mouth every 4 (four) hours as needed.      LUMIGAN 0.01 % SOLN Place 1 drop into the left eye at bedtime.      metoprolol  tartrate (LOPRESSOR ) 50 MG tablet Take 1 tablet (50 mg total) by mouth 2 (two) times daily as needed. 180 tablet 1    pantoprazole  (PROTONIX ) 40 MG tablet Take 40 mg by mouth daily.      Rivaroxaban   (XARELTO ) 15 MG TABS tablet Take 1 tablet (15 mg total) by mouth daily with supper. 90 tablet 1 02/23/2024   Trospium  Chloride 60 MG CP24 Take 1 capsule (60 mg total) by mouth daily. (Patient not taking: Reported on 08/21/2023) 90 capsule 0    VYZULTA  0.024 % SOLN Place 1 drop into the right eye at bedtime.        Allergies  Allergen Reactions   Amoxicillin-Pot Clavulanate Nausea And Vomiting    Has patient had a PCN reaction causing immediate rash, facial/tongue/throat swelling, SOB or lightheadedness with hypotension: No Has patient had a PCN reaction causing severe rash involving mucus membranes or skin necrosis: No Has patient had a PCN reaction that required hospitalization: No Has patient had a PCN reaction occurring within the last 10 years: Yes  If all of the above answers are NO, then may proceed with Cephalosporin use.    Codeine Nausea And Vomiting   Prednisone Other (See Comments)    Shaky feeling   Simvastatin Other (See Comments)    did something to muscles   Sulfa Antibiotics Other (See Comments)    Reaction: unknown   Sulfamethoxazole-Trimethoprim Other (See Comments) and Nausea And Vomiting    Reaction: unknown   Levofloxacin Rash   Tramadol Itching and Rash     Past Medical History:  Diagnosis Date   Aortic atherosclerosis    Arthritis    Asthma  Carpal tunnel syndrome    Cerebrovascular disease    Chronic atrial fibrillation (HCC)    Colonic polyp    Complication of anesthesia    took me a long to wake up   Coronary artery disease    Degenerative joint disease of cervical and lumbar spine    Dysrhythmia    GERD (gastroesophageal reflux disease)    Glaucoma    Hashimoto's thyroiditis    a. 05/2002 s/p resection of thyroid  nodule-->on replacement.   Hiatal hernia    History of depression    Hyperlipidemia    Hypertension    Hypothyroidism    Hypothyroidism due to Hashimoto's thyroiditis    Irritable bowel syndrome    Macular degeneration,  bilateral    Osteoporosis    PAF (paroxysmal atrial fibrillation) (HCC)    a. CHA2DS2VASc = 5-->xarelto ;     Pancreatic insufficiency    Perennial allergic rhinitis    Right knee meniscal tear    Stroke [facility])    TIA (transient ischemic attack)    a. 06/2013: LUE/LLE wkns x 15 mins, MRI neg for CVA.    Review of systems:  Otherwise negative.    Physical Exam  Gen: Alert, oriented. Appears stated age.  HEENT: PERRLA. Lungs: No respiratory distress CV: RRR Abd: soft, benign, no masses Ext: No edema    Planned procedures: Proceed with EGD/colonoscopy. The patient understands the nature of the planned procedure, indications, risks, alternatives and potential complications including but not limited to bleeding, infection, perforation, damage to internal organs and possible oversedation/side effects from anesthesia. The patient agrees and gives consent to proceed.  Please refer to procedure notes for findings, recommendations and patient disposition/instructions.     Jacqueline ONEIDA Schick, MD [facility]

## 2024-02-26 NOTE — Anesthesia Postprocedure Evaluation (Signed)
 Anesthesia Post Note  Patient: Jacqueline Powers  Procedure(s) Performed: COLONOSCOPY EGD (ESOPHAGOGASTRODUODENOSCOPY)  Patient location during evaluation: PACU Anesthesia Type: General Level of consciousness: awake Pain management: satisfactory to patient Vital Signs Assessment: post-procedure vital signs reviewed and stable Respiratory status: spontaneous breathing Cardiovascular status: stable Anesthetic complications: no   No notable events documented.   Last Vitals:  Vitals:   02/26/24 1242 02/26/24 1251  BP:  122/62  Pulse: 66 67  Resp: 12 16  Temp:    SpO2: 100% 100%    Last Pain:  Vitals:   02/26/24 1233  TempSrc:   PainSc: 0-No pain                 VAN STAVEREN,Megan Presti

## 2024-02-26 NOTE — Interval H&P Note (Signed)
 History and Physical Interval Note:  02/26/2024 11:46 AM  Jacqueline Powers  has presented today for surgery, with the diagnosis of gen abd pain melena.  The various methods of treatment have been discussed with the patient and family. After consideration of risks, benefits and other options for treatment, the patient has consented to  Procedure(s): COLONOSCOPY (N/A) EGD (ESOPHAGOGASTRODUODENOSCOPY) (N/A) as a surgical intervention.  The patient's history has been reviewed, patient examined, no change in status, stable for surgery.  I have reviewed the patient's chart and labs.  Questions were answered to the patient's satisfaction.     Ole ONEIDA Schick  Ok to proceed with EGD/Colonoscopy

## 2024-02-26 NOTE — Anesthesia Procedure Notes (Signed)
 Date/Time: 02/26/2024 11:58 AM  Performed by: Duwayne Craven, CRNAPre-anesthesia Checklist: Patient identified, Emergency Drugs available, Suction available, Patient being monitored and Timeout performed Patient Re-evaluated:Patient Re-evaluated prior to induction Oxygen Delivery Method: Nasal cannula Induction Type: IV induction Placement Confirmation: CO2 detector and positive ETCO2

## 2024-03-15 ENCOUNTER — Other Ambulatory Visit: Payer: Self-pay | Admitting: Family Medicine

## 2024-03-15 DIAGNOSIS — M5412 Radiculopathy, cervical region: Secondary | ICD-10-CM

## 2024-03-20 ENCOUNTER — Inpatient Hospital Stay: Admission: RE | Admit: 2024-03-20 | Discharge: 2024-03-20 | Attending: Family Medicine | Admitting: Family Medicine

## 2024-03-20 DIAGNOSIS — M5412 Radiculopathy, cervical region: Secondary | ICD-10-CM

## 2024-03-21 ENCOUNTER — Telehealth (HOSPITAL_BASED_OUTPATIENT_CLINIC_OR_DEPARTMENT_OTHER): Payer: Self-pay

## 2024-03-21 NOTE — Telephone Encounter (Signed)
   Pre-operative Risk Assessment    Patient Name: Jacqueline Powers  DOB: August 29, 1934 MRN: [id number]   Date of last office visit: 08/21/2023 with Timothy Gollan, MD Date of next office visit: N/A   Request for Surgical Clearance    Procedure:  Cervical interlaminar ESI  Date of Surgery:  Clearance TBD                                 Surgeon:  Not specified Surgeon's Group or Practice Name:  [facility] Phone number:  Not specified - referred for clearance by Benton Dowse, NP [phone] Fax number:  Not specified - referred by Benton Dowse, NP -FAX: [phone]   Type of Clearance Requested:   - Medical  - Pharmacy:  Hold Rivaroxaban  (Xarelto ) Hold 48 hours prior to cervical laminar ESI   Type of Anesthesia:  Does not specify    Additional requests/questions:  Please advise/fax back with recommendation. [place], CMA  Signed, Patrcia Iverson CROME   03/21/2024, 11:48 AM

## 2024-03-22 ENCOUNTER — Telehealth: Payer: Self-pay

## 2024-03-22 NOTE — Telephone Encounter (Signed)
 Patient will need to have a VV as she has not been seen by cardiology in person since 08/21/2023.  Still need pharmacy to weigh in.

## 2024-03-22 NOTE — Telephone Encounter (Signed)
 Patient has been scheduled for televisit med rec and consent done     Patient Consent for Virtual Visit         Jacqueline Powers has provided verbal consent on 03/22/2024 for a virtual visit (video or telephone).   CONSENT FOR VIRTUAL VISIT FOR:  Jacqueline Powers  By participating in this virtual visit I agree to the following:  I hereby voluntarily request, consent and authorize [facility] and its employed or contracted physicians, physician assistants, nurse practitioners or other licensed health care professionals (the Practitioner), to provide me with telemedicine health care services (the "Services) as deemed necessary by the treating Practitioner. I acknowledge and consent to receive the Services by the Practitioner via telemedicine. I understand that the telemedicine visit will involve communicating with the Practitioner through live audiovisual communication technology and the disclosure of certain medical information by electronic transmission. I acknowledge that I have been given the opportunity to request an in-person assessment or other available alternative prior to the telemedicine visit and am voluntarily participating in the telemedicine visit.  I understand that I have the right to withhold or withdraw my consent to the use of telemedicine in the course of my care at any time, without affecting my right to future care or treatment, and that the Practitioner or I may terminate the telemedicine visit at any time. I understand that I have the right to inspect all information obtained and/or recorded in the course of the telemedicine visit and may receive copies of available information for a reasonable fee.  I understand that some of the potential risks of receiving the Services via telemedicine include:  Delay or interruption in medical evaluation due to technological equipment failure or disruption; Information transmitted may not be sufficient (e.g. poor resolution of  images) to allow for appropriate medical decision making by the Practitioner; and/or  In rare instances, security protocols could fail, causing a breach of personal health information.  Furthermore, I acknowledge that it is my responsibility to provide information about my medical history, conditions and care that is complete and accurate to the best of my ability. I acknowledge that Practitioner's advice, recommendations, and/or decision may be based on factors not within their control, such as incomplete or inaccurate data provided by me or distortions of diagnostic images or specimens that may result from electronic transmissions. I understand that the practice of medicine is not an exact science and that Practitioner makes no warranties or guarantees regarding treatment outcomes. I acknowledge that a copy of this consent can be made available to me via my patient portal [facility] MyChart), or I can request a printed copy by calling the office of  [facility].    I understand that my insurance will be billed for this visit.   I have read or had this consent read to me. I understand the contents of this consent, which adequately explains the benefits and risks of the Services being provided via telemedicine.  I have been provided ample opportunity to ask questions regarding this consent and the Services and have had my questions answered to my satisfaction. I give my informed consent for the services to be provided through the use of telemedicine in my medical care

## 2024-03-22 NOTE — Telephone Encounter (Signed)
 Will forward to preop APP as to confirm if the pt is going to need an appt. I see notes were sent to pharm-d as well

## 2024-03-22 NOTE — Telephone Encounter (Signed)
Patient has been scheduled for televisit.

## 2024-03-22 NOTE — Telephone Encounter (Signed)
 Patient wants a call back to confirm when she can stop her Xarelto  so she can have a shot in her neck for a pinched nerve.

## 2024-03-27 ENCOUNTER — Other Ambulatory Visit: Payer: Self-pay | Admitting: Cardiovascular Disease

## 2024-03-27 ENCOUNTER — Ambulatory Visit: Attending: Cardiology | Admitting: Emergency Medicine

## 2024-03-27 DIAGNOSIS — Z01810 Encounter for preprocedural cardiovascular examination: Secondary | ICD-10-CM | POA: Diagnosis not present

## 2024-03-27 DIAGNOSIS — I480 Paroxysmal atrial fibrillation: Secondary | ICD-10-CM

## 2024-03-27 NOTE — Telephone Encounter (Signed)
 Patient with diagnosis of afib on Xarelto  for anticoagulation.    Procedure: Cervical interlaminar ESI  Date of procedure: TBD   CHA2DS2-VASc Score = 7   This indicates a 11.2% annual risk of stroke. The patient's score is based upon: CHF History: 0 HTN History: 1 Diabetes History: 0 Stroke History: 2 (TIA) Vascular Disease History: 1 Age Score: 2 Gender Score: 1      CrCl 41 ml/min Platelet count 242  Patient has not had an Afib/aflutter ablation in the last 3 months, DCCV within the last 4 weeks or a watchman implanted in the last 45 days   Per office protocol, patient can hold Xarelto  for 3 days prior to procedure.   Patient previously approved by Dr. Gollan to hold 3 days.  **This guidance is not considered finalized until pre-operative APP has relayed final recommendations.**

## 2024-03-27 NOTE — Progress Notes (Signed)
 Virtual Visit via Telephone Note   Because of SHAMIA UPPAL co-morbid illnesses, she is at least at moderate risk for complications without adequate follow up.  This format is felt to be most appropriate for this patient at this time.  Due to technical limitations with video connection (technology), today's appointment will be conducted as an audio only telehealth visit, and Jacqueline Powers verbally agreed to proceed in this manner.   All issues noted in this document were discussed and addressed.  No physical exam could be performed with this format.  Evaluation Performed:  Preoperative cardiovascular risk assessment _____________   Date:  03/27/2024   Patient ID:  Jacqueline Powers, DOB 04-Apr-1935, MRN [id number] Patient Location:  Home Provider location:   Office  Primary Care Provider:  Rudolpho Norleen BIRCH, MD Primary Cardiologist:  Evalene Lunger, MD  Chief Complaint / Patient Profile   89 y.o. y/o female with a h/o hyperlipidemia, Hashimoto's thyroiditis with elevated TSH, osteoporosis, history of chest pain, GERD, paroxysmal atrial fibrillation, prior TIAs, PAD who is pending cervical interlaminar ESI on date TBD with [facility] imaging and presents today for telephonic preoperative cardiovascular risk assessment.  History of Present Illness    Jacqueline Powers is a 89 y.o. female who presents via audio/video conferencing for a telehealth visit today.  Pt was last seen in cardiology clinic on 08/21/2023 by Dr. Gollan.  At that time Jacqueline Powers was doing well.  The patient is now pending procedure as outlined above.   Since her last visit, she denies chest pain, shortness of breath, lower extremity edema, fatigue, palpitations, melena, hematuria, hemoptysis, diaphoresis, weakness, presyncope, syncope, orthopnea, and PND.  Today patient is doing well without acute cardiovascular concerns.  She states fairly active without exertional chest pains or dyspnea.  She denies any  symptoms concerning for recurrent atrial fibrillation.  She is without symptoms of active angina.  She can complete greater than 4 METS without limitations.  Past Medical History    Past Medical History:  Diagnosis Date   Aortic atherosclerosis    Arthritis    Asthma    Carpal tunnel syndrome    Cerebrovascular disease    Chronic atrial fibrillation (HCC)    Colonic polyp    Complication of anesthesia    took me a long to wake up   Coronary artery disease    Degenerative joint disease of cervical and lumbar spine    Dysrhythmia    GERD (gastroesophageal reflux disease)    Glaucoma    Hashimoto's thyroiditis    a. 05/2002 s/p resection of thyroid  nodule-->on replacement.   Hiatal hernia    History of depression    Hyperlipidemia    Hypertension    Hypothyroidism    Hypothyroidism due to Hashimoto's thyroiditis    Irritable bowel syndrome    Macular degeneration, bilateral    Osteoporosis    PAF (paroxysmal atrial fibrillation) (HCC)    a. CHA2DS2VASc = 5-->xarelto ;     Pancreatic insufficiency    Perennial allergic rhinitis    Right knee meniscal tear    Stroke [facility])    TIA (transient ischemic attack)    a. 06/2013: LUE/LLE wkns x 15 mins, MRI neg for CVA.   Past Surgical History:  Procedure Laterality Date   BACK SURGERY     L5   CATARACT EXTRACTION     CERVICAL CONE BIOPSY     CERVICAL FUSION     COLONOSCOPY     COLONOSCOPY N/A  02/26/2024   Procedure: COLONOSCOPY;  Surgeon: Maryruth Ole DASEN, MD;  Location: [facility] ENDOSCOPY;  Service: Endoscopy;  Laterality: N/A;   COLONOSCOPY WITH PROPOFOL  N/A 01/20/2017   Procedure: COLONOSCOPY WITH PROPOFOL ;  Surgeon: Viktoria Lamar DASEN, MD;  Location: [facility] ENDOSCOPY;  Service: Endoscopy;  Laterality: N/A;   COLONOSCOPY WITH PROPOFOL  N/A 07/21/2020   Procedure: COLONOSCOPY WITH PROPOFOL ;  Surgeon: Maryruth Ole DASEN, MD;  Location: [facility] ENDOSCOPY;  Service: Endoscopy;  Laterality: N/A;   Cyst of lumbar spine N/A    ENDOSCOPIC  RETROGRADE CHOLANGIOPANCREATOGRAPHY (ERCP) WITH PROPOFOL  N/A    ESOPHAGOGASTRODUODENOSCOPY N/A 07/21/2020   Procedure: ESOPHAGOGASTRODUODENOSCOPY (EGD);  Surgeon: Maryruth Ole DASEN, MD;  Location: [facility] ENDOSCOPY;  Service: Endoscopy;  Laterality: N/A;   ESOPHAGOGASTRODUODENOSCOPY N/A 02/26/2024   Procedure: EGD (ESOPHAGOGASTRODUODENOSCOPY);  Surgeon: Maryruth Ole DASEN, MD;  Location: [facility] ENDOSCOPY;  Service: Endoscopy;  Laterality: N/A;   ESOPHAGOGASTRODUODENOSCOPY (EGD) WITH PROPOFOL  N/A 01/20/2017   Procedure: ESOPHAGOGASTRODUODENOSCOPY (EGD) WITH PROPOFOL ;  Surgeon: Viktoria Lamar DASEN, MD;  Location: [facility] ENDOSCOPY;  Service: Endoscopy;  Laterality: N/A;   ESOPHAGOGASTRODUODENOSCOPY (EGD) WITH PROPOFOL  N/A 04/28/2017   Procedure: ESOPHAGOGASTRODUODENOSCOPY (EGD) WITH PROPOFOL ;  Surgeon: Viktoria Lamar DASEN, MD;  Location: [facility] ENDOSCOPY;  Service: Endoscopy;  Laterality: N/A;   KNEE ARTHROSCOPY N/A    KYPHOPLASTY N/A 04/08/2021   Procedure: KYPHOPLASTY T12, L1;  Surgeon: Mavis Purchase, MD;  Location: [facility] OR;  Service: Neurosurgery;  Laterality: N/A;   Resection of thyroid  nodule N/A    TONSILLECTOMY      Allergies  Allergies  Allergen Reactions   Amoxicillin-Pot Clavulanate Nausea And Vomiting    Has patient had a PCN reaction causing immediate rash, facial/tongue/throat swelling, SOB or lightheadedness with hypotension: No Has patient had a PCN reaction causing severe rash involving mucus membranes or skin necrosis: No Has patient had a PCN reaction that required hospitalization: No Has patient had a PCN reaction occurring within the last 10 years: Yes  If all of the above answers are NO, then may proceed with Cephalosporin use.    Codeine Nausea And Vomiting   Prednisone Other (See Comments)    Shaky feeling   Simvastatin Other (See Comments)    did something to muscles   Sulfa Antibiotics Other (See Comments)    Reaction: unknown   Sulfamethoxazole-Trimethoprim Other  (See Comments) and Nausea And Vomiting    Reaction: unknown   Levofloxacin Rash   Tramadol Itching and Rash    Home Medications    Prior to Admission medications   Medication Sig Start Date End Date Taking? Authorizing Provider  azelastine  (ASTELIN ) 0.1 % nasal spray Place 1 spray into both nostrils 2 (two) times daily as needed for allergies. Use in each nostril as directed    [provider]  Cholecalciferol (VITAMIN D) 50 MCG (2000 UT) CAPS Take 4,000 Units by mouth daily.    [provider]  dorzolamide -timolol  (COSOPT ) 22.3-6.8 MG/ML ophthalmic solution Place 1 drop into both eyes 2 (two) times daily.    [provider]  ezetimibe  (ZETIA ) 10 MG tablet Take 1 tablet (10 mg total) by mouth daily. Take 1 tablet (10 mg) by mouth once daily as needed 08/21/23   Gollan, Timothy J, MD  fluticasone  (FLONASE ) 50 MCG/ACT nasal spray Place 2 sprays into both nostrils at bedtime as needed for allergies.  02/17/14   [provider]  HYDROcodone -acetaminophen  (NORCO/VICODIN) 5-325 MG tablet Take 1-2 tablets by mouth every 4 (four) hours as needed.    [provider]  levothyroxine  (  SYNTHROID , LEVOTHROID) 25 MCG tablet Take 25 mcg by mouth daily before breakfast.  06/12/14   [provider]  LUMIGAN 0.01 % SOLN Place 1 drop into the left eye at bedtime. 05/15/15   [provider]  metoprolol  succinate (TOPROL -XL) 25 MG 24 hr tablet Take 1 tablet (25 mg total) by mouth daily. 02/23/24   Gollan, Timothy J, MD  metoprolol  tartrate (LOPRESSOR ) 50 MG tablet Take 1 tablet (50 mg total) by mouth 2 (two) times daily as needed. 08/21/23 11/19/23  Gollan, Timothy J, MD  pantoprazole  (PROTONIX ) 40 MG tablet Take 40 mg by mouth daily. 04/22/23 04/21/24  [provider]  Rivaroxaban  (XARELTO ) 15 MG TABS tablet Take 1 tablet (15 mg total) by mouth daily with supper. 08/21/23   Gollan, Timothy J, MD  Trospium  Chloride 60 MG CP24 Take 1 capsule (60 mg  total) by mouth daily. Patient not taking: Reported on 08/21/2023 05/03/22   Helon Kirsch A, PA-C  VYZULTA  0.024 % SOLN Place 1 drop into the right eye at bedtime. 01/12/21   [provider]    Physical Exam    Vital Signs:  Jacqueline Powers does not have vital signs available for review today.  Given telephonic nature of communication, physical exam is limited. AAOx3. NAD. Normal affect.  Speech and respirations are unlabored.  Accessory Clinical Findings    None  Assessment & Plan    1.  Preoperative Cardiovascular Risk Assessment: According to the Revised Cardiac Risk Index (RCRI), her Perioperative Risk of Major Cardiac Event is (%): 0.9. Her Functional Capacity in METs is: 5.07 according to the Duke Activity Status Index (DASI).  Therefore, based on ACC/AHA guidelines, patient would be at acceptable risk for the planned procedure without further cardiovascular testing. I will route this recommendation to the requesting party via Epic fax function.  The patient was advised that if she develops new symptoms prior to surgery to contact our office to arrange for a follow-up visit, and she verbalized understanding.  Per office protocol, patient can hold Xarelto  for 3 days prior to procedure.   Patient previously approved by Dr. Gollan to hold 3 days.  A copy of this note will be routed to requesting surgeon.  Time:   Today, I have spent 16 minutes with the patient with telehealth technology discussing medical history, symptoms, and management plan.     Jacqueline LITTIE Louis, NP  03/27/2024, 2:50 PM

## 2024-03-28 ENCOUNTER — Telehealth: Payer: Self-pay | Admitting: Cardiovascular Disease

## 2024-03-28 ENCOUNTER — Other Ambulatory Visit: Payer: Self-pay | Admitting: Family Medicine

## 2024-03-28 DIAGNOSIS — M5412 Radiculopathy, cervical region: Secondary | ICD-10-CM

## 2024-03-28 NOTE — Telephone Encounter (Signed)
 Note I called the pt back and left a detailed message that she was cleared yesterday and notes were faxed yesterday by Lum Louis, NP after his tele preop appt with the pt. Recommendations for blood thinner are in the notes as well.      I will re-fax notes to [facility].        03/28/24  2:36 PM You attempted to contact Jacqueline Powers (Left Message)   03/28/24  2:06 PM Jacqueline Powers routed this conversation to Cv Div Preop Callback (Selected Message) Jacqueline Powers   03/28/24  2:06 PM Note Patient called to follow-up on the status of her clearance and stated [facility] will be faxing over a form to be completed and returned to [facility].       03/28/24  2:03 PM Jacqueline Powers, Jacqueline Powers contacted Jacqueline Powers, Jacqueline Powers

## 2024-03-28 NOTE — Telephone Encounter (Signed)
 I s/w the requesting office and it was confirmed they received the notes from our preop team.

## 2024-03-28 NOTE — Telephone Encounter (Signed)
 I called the pt back and left a detailed message that she was cleared yesterday and notes were faxed yesterday by Lum Louis, NP after his tele preop appt with the pt. Recommendations for blood thinner are in the notes as well.    I will re-fax notes to [facility].

## 2024-03-28 NOTE — Telephone Encounter (Signed)
 Patient called to follow-up on the status of her clearance and stated [facility] will be faxing over a form to be completed and returned to [facility].

## 2024-03-28 NOTE — Telephone Encounter (Addendum)
 Prescription refill request for Xarelto  received.  Indication: a fib Last office visit: 03/27/24 Weight: 116# Age: 89 Scr: 0.8 CrCl: 41 ml/min

## 2024-03-28 NOTE — Telephone Encounter (Signed)
 Refill Request.

## 2024-03-29 NOTE — Telephone Encounter (Signed)
 Secure chat sent to me this morning from Jacqueline Powers: Good Morning , pt called to let me know that you all faxed the thinner clearance over . What number was it faxed to . I havent received anything . Is it documented somewhere in her chart because I can use that but we only need her to hold it for 2 doses .   Me: HI Jacqueline, I believe we spoke yesterday and you confirmed notes were received. the recommendations for blood thinner are noted as ok to hold x 3 days if needed which covers the 2 doses .  Assessment & Plan  1. Preoperative Cardiovascular Risk Assessment: According to the Revised Cardiac Risk Index (RCRI), her Perioperative Risk of Major Cardiac Event is (%): 0.9. Her Functional Capacity in METs is: 5.07 according to the Duke Activity Status Index (DASI).  Therefore, based on ACC/AHA guidelines, patient would be at acceptable risk for the planned procedure without further cardiovascular testing. I will route this recommendation to the requesting party via Epic fax function.  The patient was advised that if she develops new symptoms prior to surgery to contact our office to arrange for a follow-up visit, and she verbalized understanding.  Per office protocol, patient can hold Xarelto  for 3 days prior to procedure.  Patient previously approved by Dr. Gollan to hold 3 days.  A copy of this note will be routed to requesting surgeon.  Time:  Today, I have spent 16 minutes with the patient with telehealth technology discussing medical history, symptoms, and management plan.    Lum LITTIE Louis, NP  03/27/2024, 2:50 PM   notes were faxed to Fax number: Not specified - referred by Benton Dowse, NP -FAX: [phone]  I can fax them again   [facility]: This will work, Thank you ! I will call her to get her scheduled !

## 2024-04-16 NOTE — Discharge Instructions (Signed)

## 2024-04-17 ENCOUNTER — Telehealth: Payer: Self-pay | Admitting: Cardiovascular Disease

## 2024-04-17 ENCOUNTER — Ambulatory Visit
Admission: RE | Admit: 2024-04-17 | Discharge: 2024-04-17 | Disposition: A | Discharge status: Home / Self Care | Source: Ambulatory Visit | Attending: Family Medicine | Admitting: Family Medicine

## 2024-04-17 DIAGNOSIS — M5412 Radiculopathy, cervical region: Secondary | ICD-10-CM

## 2024-04-17 MED ORDER — TRIAMCINOLONE ACETONIDE 40 MG/ML IJ SUSP (RADIOLOGY)
60.0000 mg | Freq: Once | INTRAMUSCULAR | Status: AC
  Administered 2024-04-17: 60 mg via EPIDURAL

## 2024-04-17 MED ORDER — IOPAMIDOL (ISOVUE-M 300) INJECTION 61%
1.0000 mL | Freq: Once | INTRAMUSCULAR | Status: AC | PRN
  Administered 2024-04-17: 1 mL via EPIDURAL

## 2024-04-17 NOTE — Telephone Encounter (Signed)
 Spoke to pt and per her report she got restart instruction from MD saying restart tonight, the charge nurse told her to wait 24 hours, and the instructions said to ask healthcare professional, confused the pt called back to the facility ([facility]) to gain further information and was told to call cardiology office  Pt examined bandaged and described a approximated site with no bleeding, pt advised to restart Xarelto  tonight according to the MD's instructions

## 2024-04-17 NOTE — Telephone Encounter (Signed)
  Pt c/o medication issue:  1. Name of Medication: Rivaroxaban  (XARELTO ) 15 MG TABS tablet   2. How are you currently taking this medication (dosage and times per day)?   3. Are you having a reaction (difficulty breathing--STAT)? N/A  4. What is your medication issue? Patient needs to know when to start back on her Xarelto  after her procedure
